# Patient Record
Sex: Female | Born: 1970
Health system: Southern US, Community
[De-identification: ages and names within clinical notes are randomized; demographics above are authoritative.]

## PROBLEM LIST (undated history)

## (undated) DIAGNOSIS — M1990 Unspecified osteoarthritis, unspecified site: Secondary | ICD-10-CM

## (undated) DIAGNOSIS — E785 Hyperlipidemia, unspecified: Secondary | ICD-10-CM

## (undated) DIAGNOSIS — R0609 Other forms of dyspnea: Secondary | ICD-10-CM

## (undated) DIAGNOSIS — K120 Recurrent oral aphthae: Secondary | ICD-10-CM

## (undated) DIAGNOSIS — K219 Gastro-esophageal reflux disease without esophagitis: Secondary | ICD-10-CM

## (undated) DIAGNOSIS — E079 Disorder of thyroid, unspecified: Secondary | ICD-10-CM

## (undated) DIAGNOSIS — T7840XA Allergy, unspecified, initial encounter: Secondary | ICD-10-CM

## (undated) DIAGNOSIS — F319 Bipolar disorder, unspecified: Secondary | ICD-10-CM

## (undated) DIAGNOSIS — F3181 Bipolar II disorder: Secondary | ICD-10-CM

## (undated) DIAGNOSIS — F419 Anxiety disorder, unspecified: Secondary | ICD-10-CM

## (undated) DIAGNOSIS — F32A Depression, unspecified: Secondary | ICD-10-CM

## (undated) DIAGNOSIS — R079 Chest pain, unspecified: Secondary | ICD-10-CM

## (undated) DIAGNOSIS — J45909 Unspecified asthma, uncomplicated: Secondary | ICD-10-CM

## (undated) HISTORY — DX: Bipolar disorder, unspecified: F31.9

## (undated) HISTORY — DX: Hyperlipidemia, unspecified: E78.5

## (undated) HISTORY — PX: RECONSTRUCTION OF EYELID: SHX6576

## (undated) HISTORY — DX: Anxiety disorder, unspecified: F41.9

## (undated) HISTORY — DX: Depression, unspecified: F32.A

## (undated) HISTORY — DX: Unspecified osteoarthritis, unspecified site: M19.90

## (undated) HISTORY — DX: Recurrent oral aphthae: K12.0

## (undated) HISTORY — DX: Allergy, unspecified, initial encounter: T78.40XA

## (undated) HISTORY — DX: Chest pain, unspecified: R07.9

## (undated) HISTORY — DX: Other forms of dyspnea: R06.09

## (undated) HISTORY — PX: BUNIONECTOMY: SHX129

## (undated) HISTORY — PX: UPPER GASTROINTESTINAL ENDOSCOPY: SHX188

## (undated) HISTORY — DX: Gastro-esophageal reflux disease without esophagitis: K21.9

---

## 1998-12-28 DIAGNOSIS — E7800 Pure hypercholesterolemia, unspecified: Secondary | ICD-10-CM

## 1998-12-28 HISTORY — DX: Pure hypercholesterolemia, unspecified: E78.00

## 2007-12-29 HISTORY — PX: MANDIBLE SURGERY: SHX707

## 2011-12-10 DIAGNOSIS — N94818 Other vulvodynia: Secondary | ICD-10-CM | POA: Insufficient documentation

## 2012-11-25 ENCOUNTER — Ambulatory Visit: Payer: Self-pay

## 2013-03-01 ENCOUNTER — Ambulatory Visit: Payer: Self-pay | Admitting: Family Medicine

## 2013-03-01 IMAGING — CR DG CHEST 2V
1 series · 2 of 2 positions shown · non-contrast
Comparison: none

REASON FOR EXAM: cough, persistent
COMMENTS:

PROCEDURE:     DXR - DXR CHEST PA (OR AP) AND LATERAL  - [DATE] [DATE]
RESULT:     Two-view chest dated [DATE].

[Series 1: pa · 0.17mm/px · 2 of 2 slices shown]
[im 1/2]
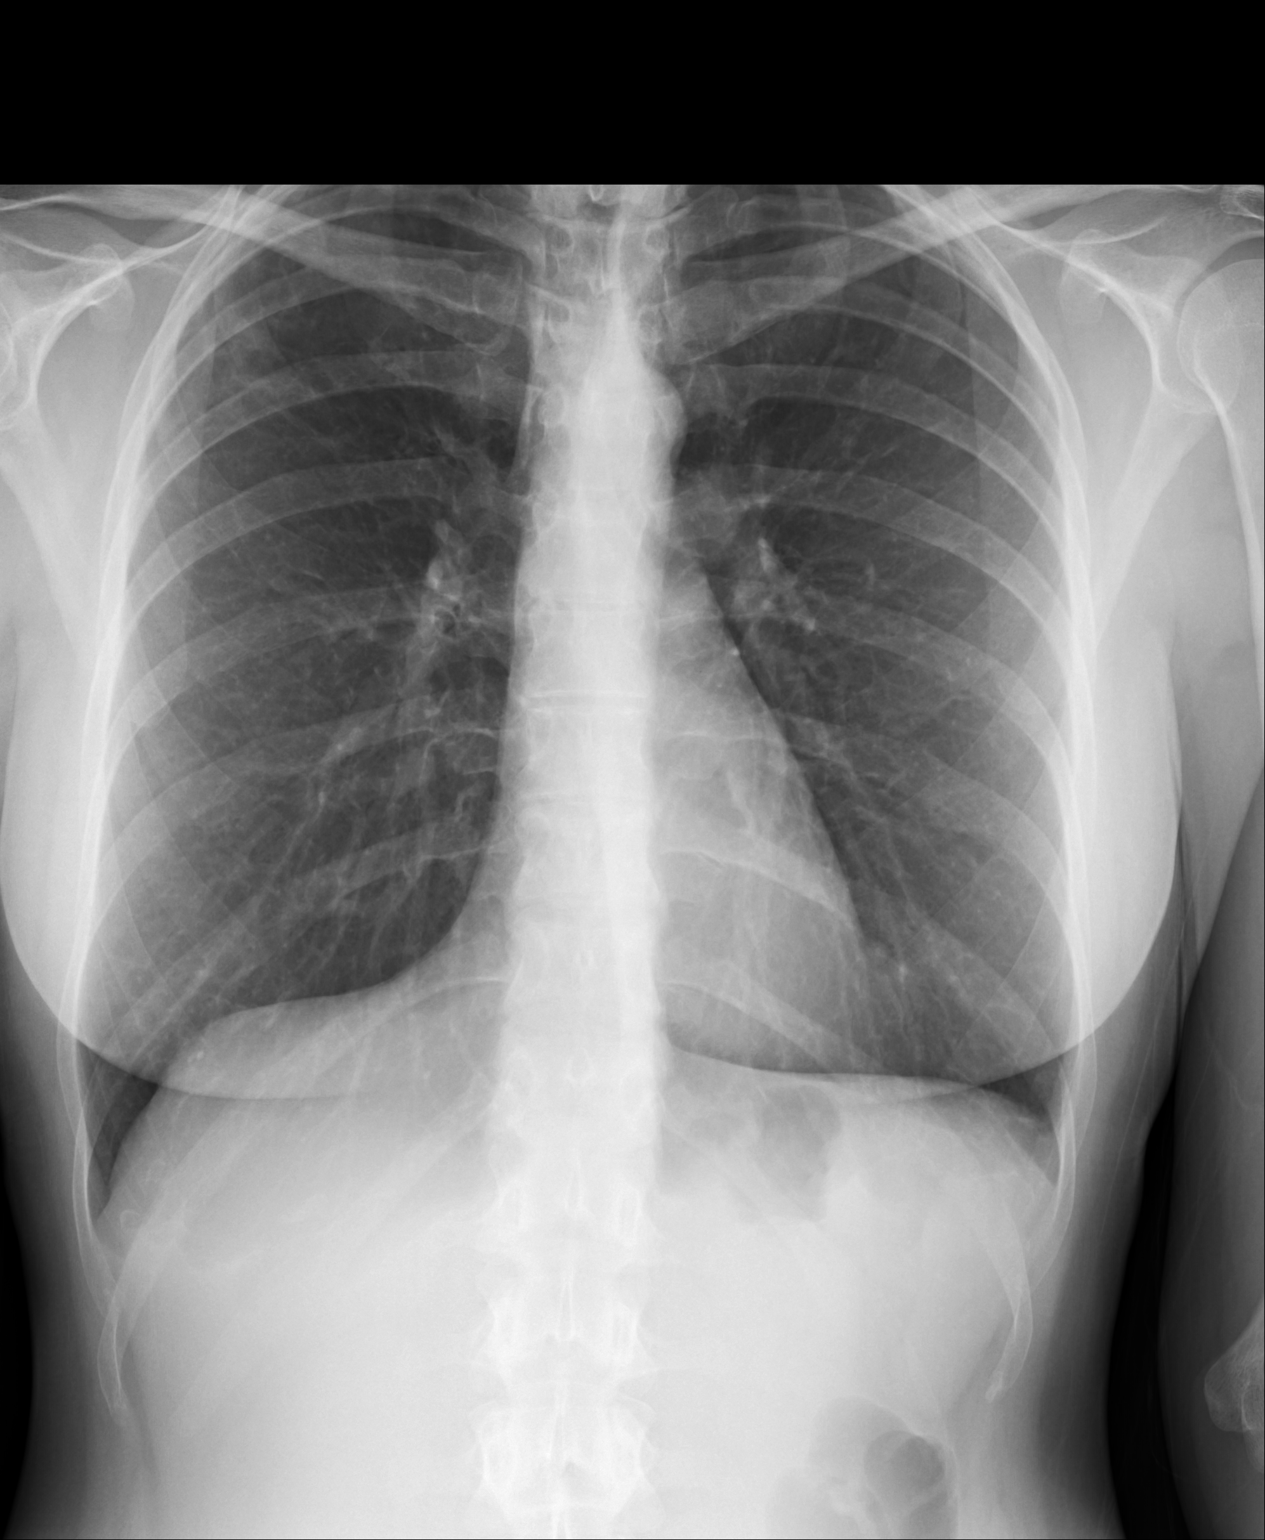
[im 2/2]
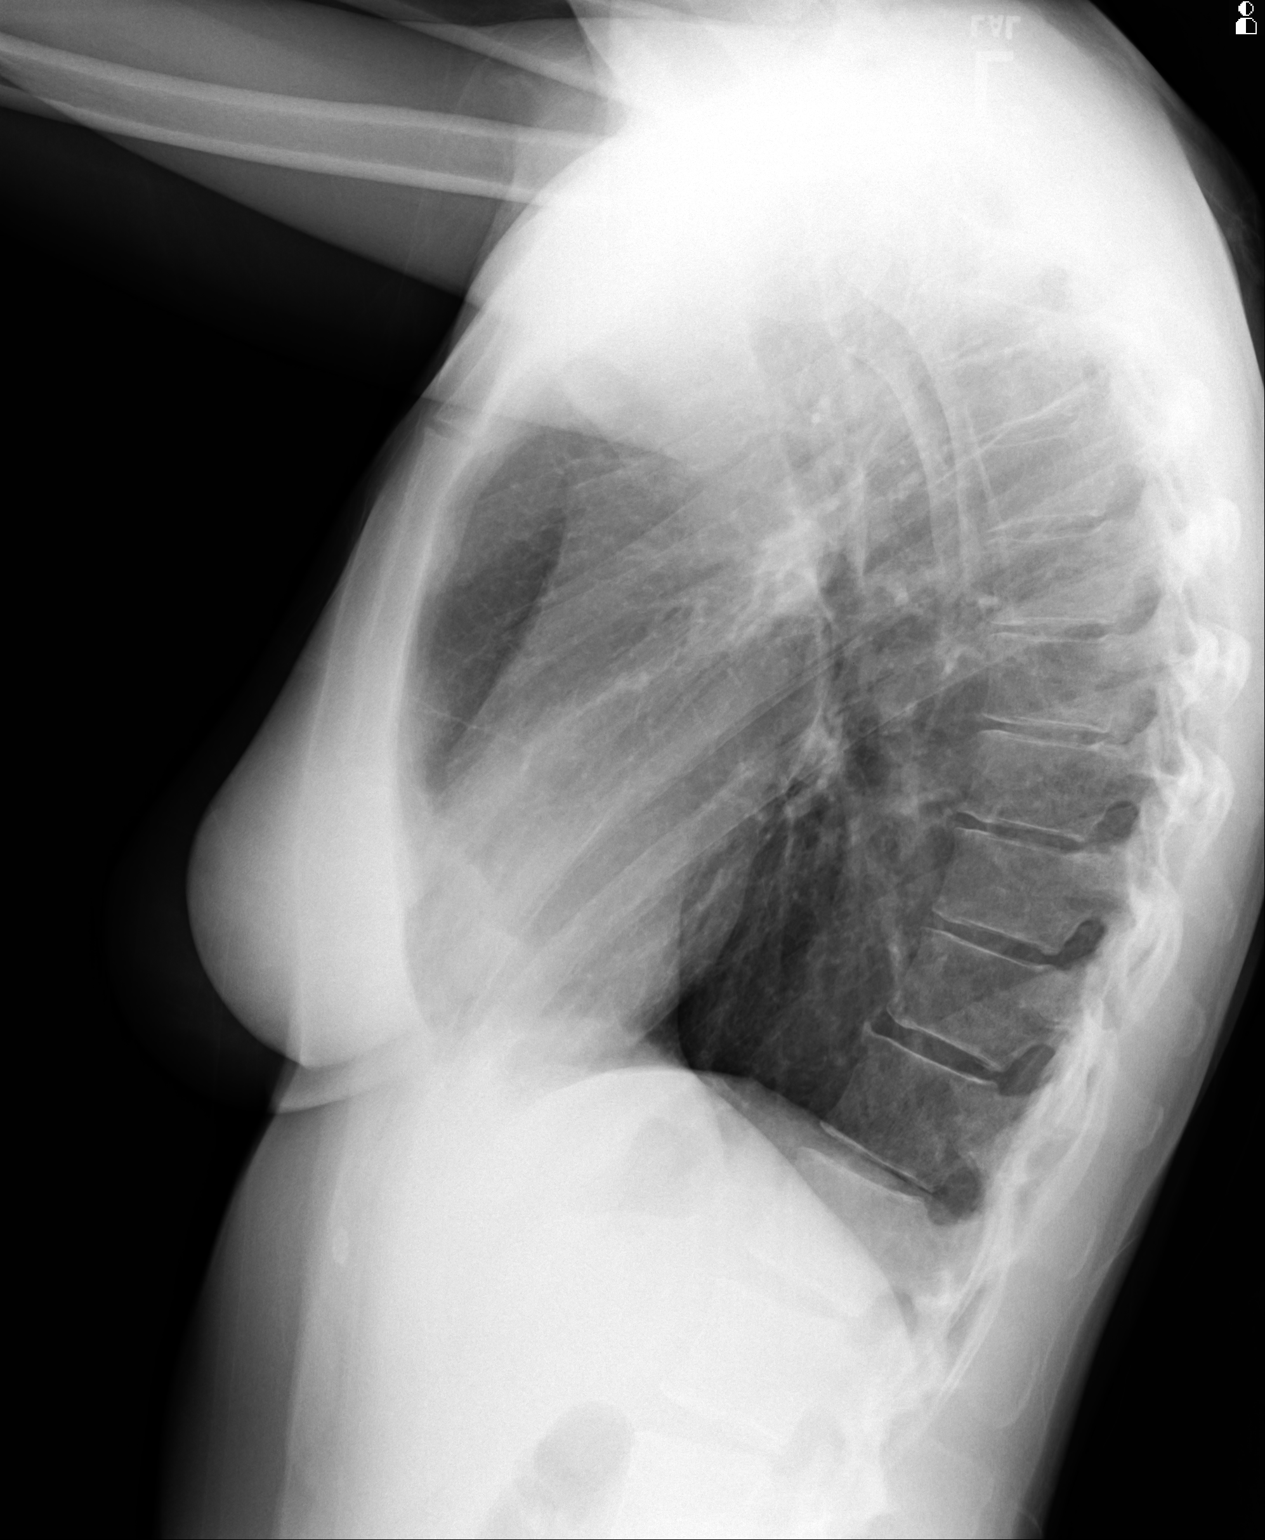

[2 of 2 positions shown; findings below may reference images not displayed]

FINDINGS: A mild area of ill-defined density projects within the right lung
apex. Different considerations are a mild infiltrate versus mild
atelectasis. No further focal recent consolidation of focal infiltrates
appreciated. Cardiac silhouette and visualized bony skeleton are
unremarkable.
IMPRESSION: Findings which are represent a mild infiltrate versus mild
atelectasis right upper lobe surveillance evaluation recommended status post
appropriate therapeutic regiment.

## 2013-03-16 ENCOUNTER — Ambulatory Visit: Payer: Self-pay | Admitting: Obstetrics and Gynecology

## 2013-03-16 IMAGING — CR DG CHEST 2V
1 series · 2 of 2 positions shown · non-contrast
Comparison: none

REASON FOR EXAM: for follow /up peristent
COMMENTS:

[Series 1: pa · 0.17mm/px · 2 of 2 slices shown]
[im 1/2]
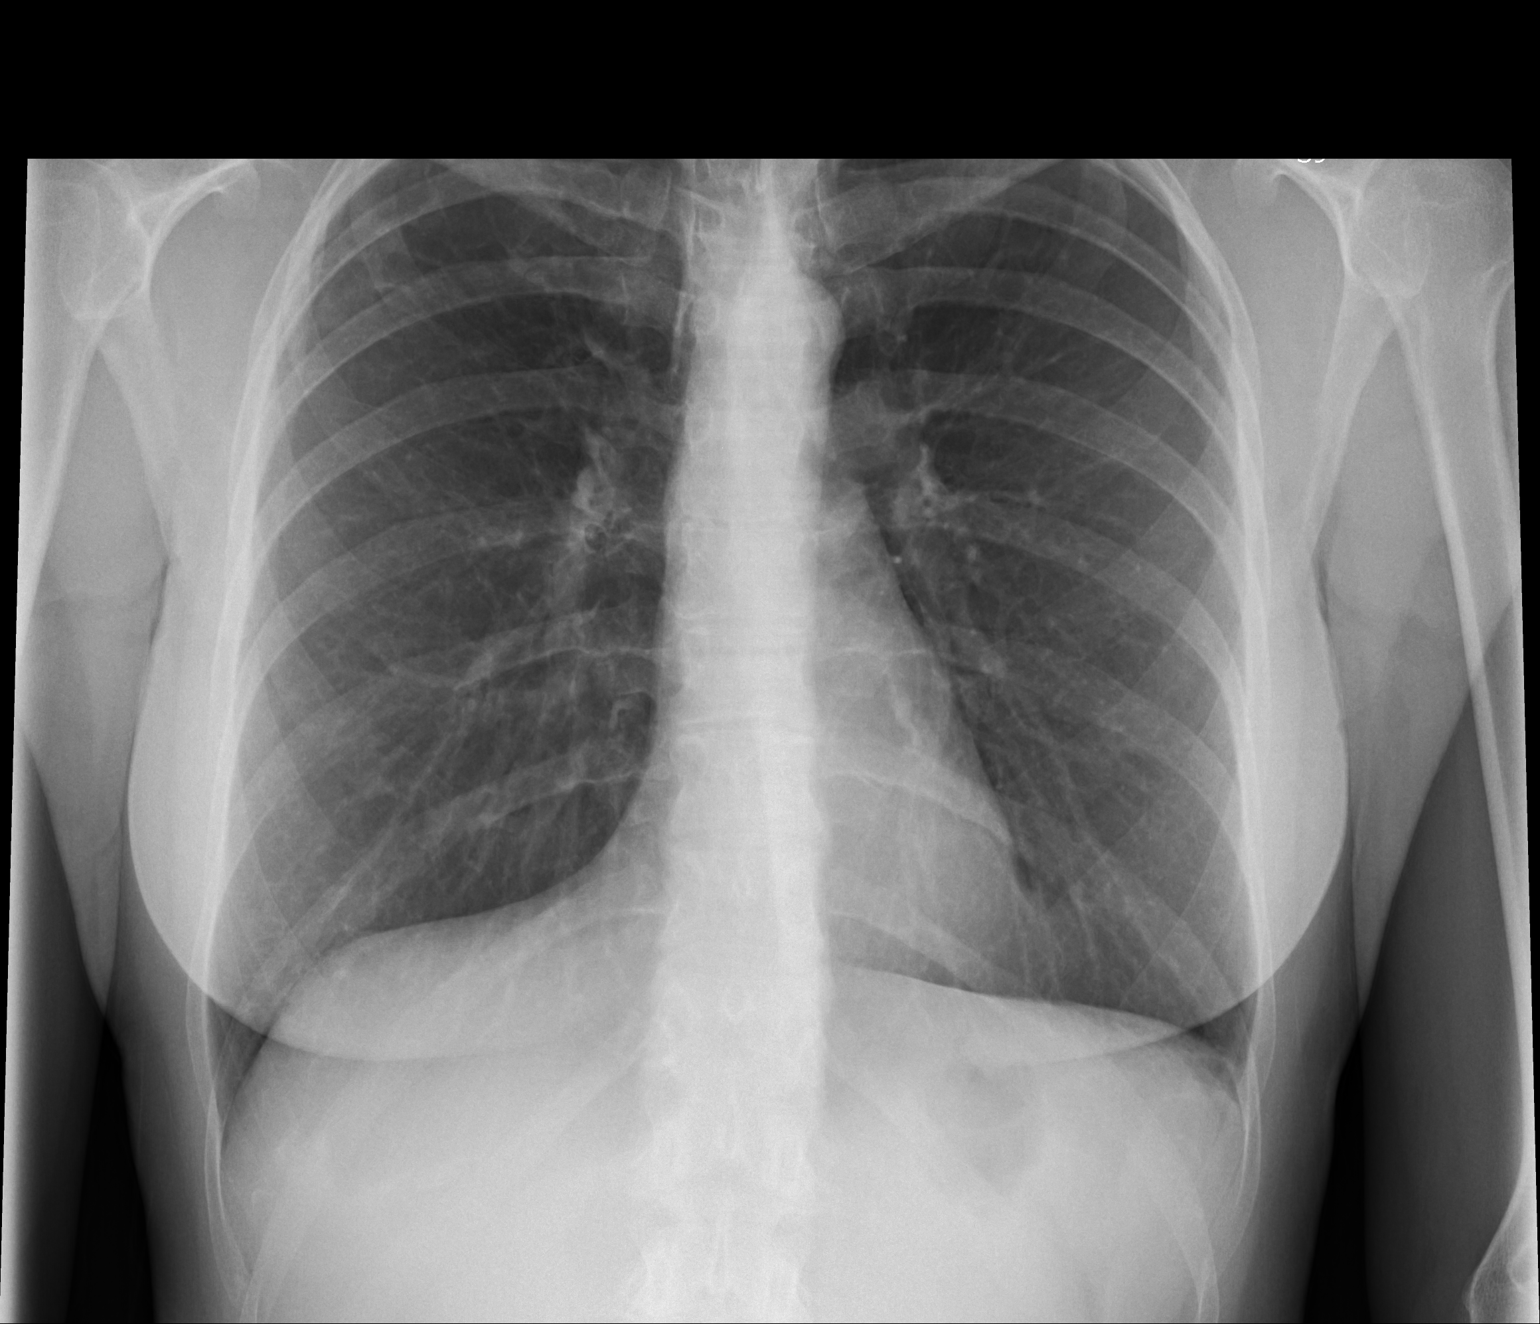
[im 2/2]
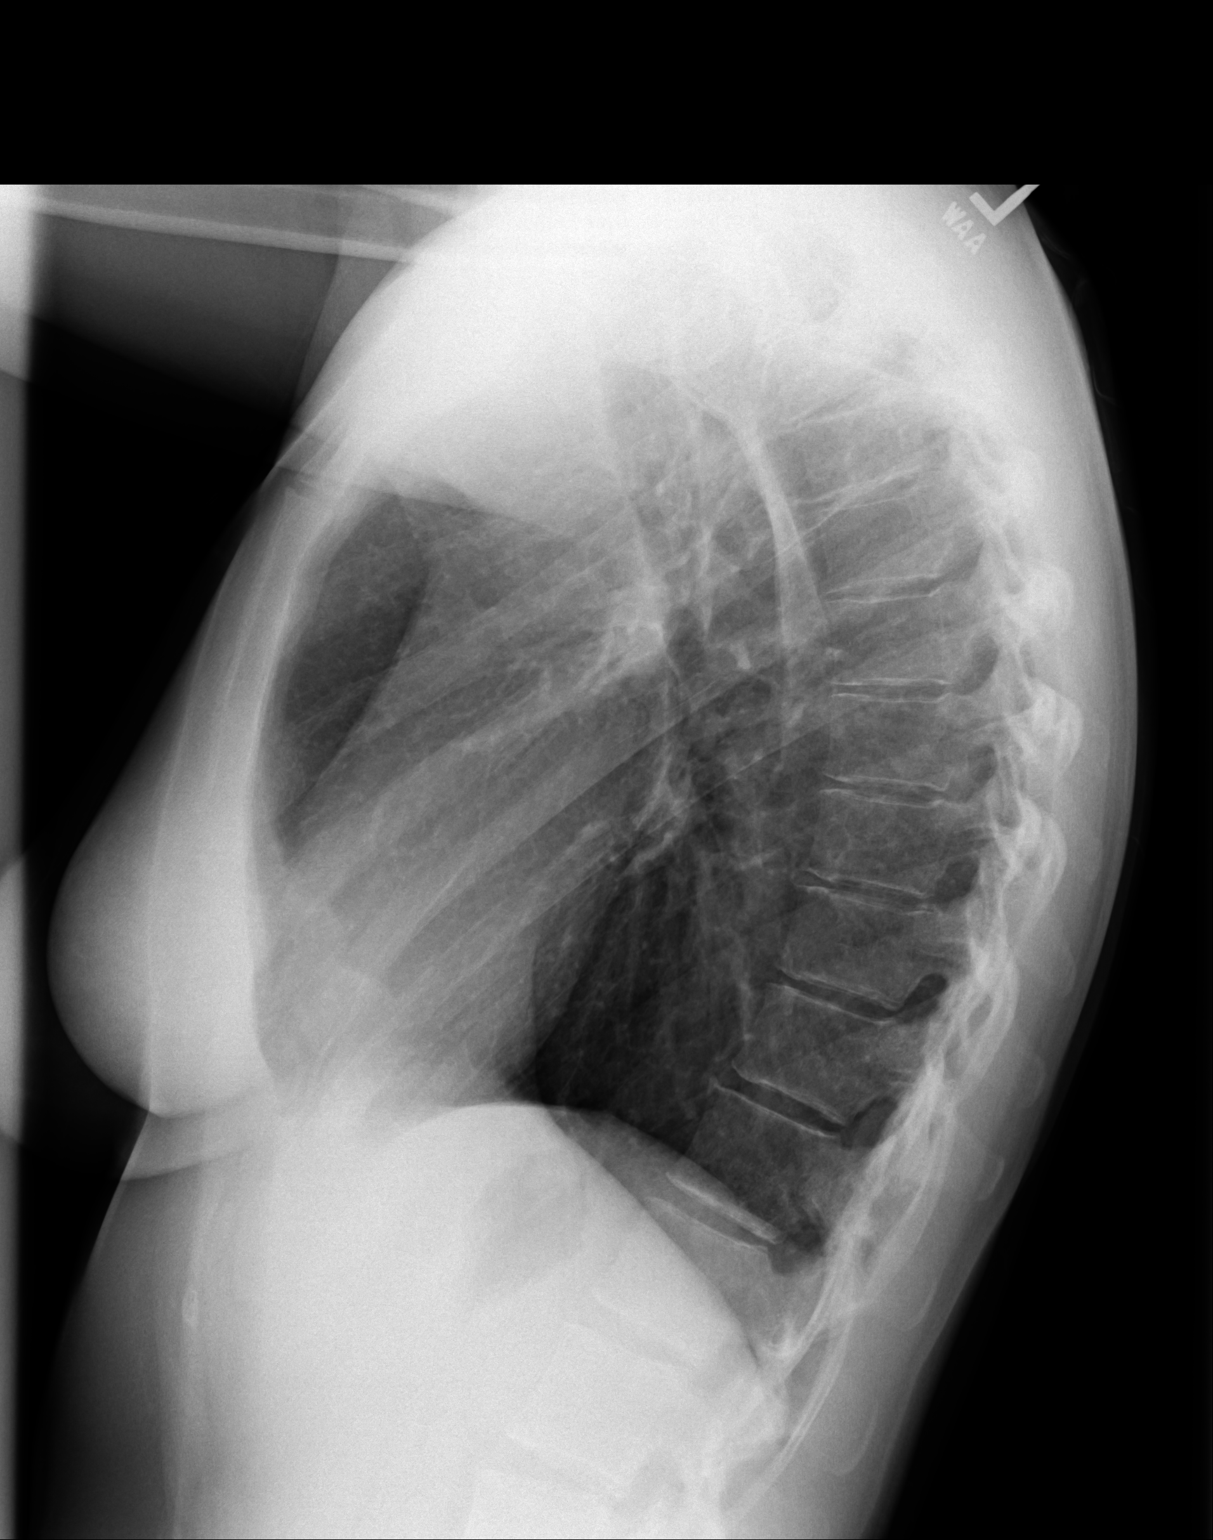

[2 of 2 positions shown; findings below may reference images not displayed]

PROCEDURE:     DXR - DXR CHEST PA (OR AP) AND LATERAL  - [DATE]  [DATE]

RESULT:     Comparison made to prior study of [DATE]. Persistent
ill-defined density right upper lobe noted. This could represent pneumonia
however an underlying tumor or other pathology cannot be excluded. Chest CT
should be considered. Heart size is normal.
IMPRESSION: Persistent ill-defined density in right upper lobe. Chest
CT suggested for further evaluation.

## 2015-07-15 ENCOUNTER — Ambulatory Visit
Admission: EM | Admit: 2015-07-15 | Discharge: 2015-07-15 | Disposition: A | Discharge status: Home / Self Care | Payer: BLUE CROSS/BLUE SHIELD | Attending: Family Medicine | Admitting: Family Medicine

## 2015-07-15 ENCOUNTER — Encounter: Payer: Self-pay | Admitting: Emergency Medicine

## 2015-07-15 DIAGNOSIS — H6123 Impacted cerumen, bilateral: Secondary | ICD-10-CM

## 2015-07-15 DIAGNOSIS — H6503 Acute serous otitis media, bilateral: Secondary | ICD-10-CM | POA: Diagnosis not present

## 2015-07-15 HISTORY — DX: Bipolar II disorder: F31.81

## 2015-07-15 HISTORY — DX: Disorder of thyroid, unspecified: E07.9

## 2015-07-15 MED ORDER — AMOXICILLIN 875 MG PO TABS: 875.0000 mg | ORAL_TABLET | Freq: Two times a day (BID) | ORAL | Status: DC

## 2015-07-15 NOTE — ED Notes (Signed)
Patient c/o pain in both ear and fullness for about a week.  Patient recently got back from a trip to [place].

## 2015-07-15 NOTE — ED Provider Notes (Signed)
CSN: [id number]     Arrival date & time 07/15/15  1051 History   First MD Initiated Contact with Patient 07/15/15 1246     Chief Complaint  Patient presents with  . Ear Fullness   (Consider location/radiation/quality/duration/timing/severity/associated sxs/prior Treatment) Patient is a 44 y.o. female presenting with plugged ear sensation. The history is provided by the patient.  Ear Fullness This is a new problem. Episode onset: 1 week. The problem occurs constantly. The problem has been gradually worsening. Pertinent negatives include no chest pain, no abdominal pain, no headaches and no shortness of breath. Nothing relieves the symptoms.    Past Medical History  Diagnosis Date  . Bipolar 2 disorder   . Thyroid disease    History reviewed. No pertinent past surgical history. History reviewed. No pertinent family history. History  Substance Use Topics  . Smoking status: Never Smoker   . Smokeless tobacco: Never Used  . Alcohol Use: Yes   OB History    No data available     Review of Systems  Respiratory: Negative for shortness of breath.   Cardiovascular: Negative for chest pain.  Gastrointestinal: Negative for abdominal pain.  Neurological: Negative for headaches.    Allergies  Review of patient's allergies indicates no known allergies.  Home Medications   Prior to Admission medications   Medication Sig Start Date End Date Taking? Authorizing Provider  buPROPion (WELLBUTRIN XL) 150 MG 24 hr tablet Take 150 mg by mouth daily.   Yes Historical Provider, MD  clonazePAM (KLONOPIN) 0.5 MG tablet Take 0.5 mg by mouth daily.   Yes Historical Provider, MD  divalproex (DEPAKOTE) 500 MG DR tablet Take 500 mg by mouth 2 (two) times daily.   Yes Historical Provider, MD  escitalopram (LEXAPRO) 20 MG tablet Take 20 mg by mouth daily.   Yes Historical Provider, MD  levothyroxine (SYNTHROID, LEVOTHROID) 100 MCG tablet Take 100 mcg by mouth daily before breakfast.   Yes Historical  Provider, MD  norgestimate-ethinyl estradiol (ORTHO-CYCLEN,SPRINTEC,PREVIFEM) 0.25-35 MG-MCG tablet Take 1 tablet by mouth daily.   Yes Historical Provider, MD  zolpidem (AMBIEN) 5 MG tablet Take 5 mg by mouth at bedtime.   Yes Historical Provider, MD  amoxicillin (AMOXIL) 875 MG tablet Take 1 tablet (875 mg total) by mouth 2 (two) times daily. 07/15/15   Payton Mccallumrlando Tillmon Kisling, MD   BP 137/91 mmHg  Pulse 69  Temp(Src) 98.1 F (36.7 C) (Tympanic)  Resp 16  Ht 5\' 5"  (1.651 m)  Wt 145 lb (65.772 kg)  BMI 24.13 kg/m2  SpO2 100%  LMP 06/17/2015 (Approximate) Physical Exam  Constitutional: She appears well-developed and well-nourished. No distress.  HENT:  Head: Normocephalic.  Right Ear: Tympanic membrane is erythematous. A middle ear effusion is present.  Left Ear: Tympanic membrane is erythematous. A middle ear effusion is present.  Nose: Nose normal.  Mouth/Throat: Uvula is midline, oropharynx is clear and moist and mucous membranes are normal.  TMs visualized after bilateral cerumen disimpaction with ear lavage  Eyes: Conjunctivae are normal.  Neck: Neck supple. No thyromegaly present.  Cardiovascular: Normal rate.   Pulmonary/Chest: Effort normal. No respiratory distress.  Lymphadenopathy:    She has no cervical adenopathy.  Skin: She is not diaphoretic.  Nursing note and vitals reviewed.   ED Course  Procedures (including critical care time) Labs Review Labs Reviewed - No data to display  Imaging Review No results found.   MDM   1. Bilateral acute serous otitis media, recurrence not specified   2.  Cerumen impaction, bilateral    Discharge Medication List as of 07/15/2015  1:27 PM    START taking these medications   Details  amoxicillin (AMOXIL) 875 MG tablet Take 1 tablet (875 mg total) by mouth 2 (two) times daily., Starting 07/15/2015, Until Discontinued, Normal      Plan: 1. diagnosis reviewed with patient; ear lavage by RN with cerumen disimpaction and visualization  of TMs bilaterally 2. rx as per orders; risks, benefits, potential side effects reviewed with patient 3. Recommend supportive treatment with otc antihistamine/decongestant and steroid nasal spray 4. F/u prn if symptoms worsen or don't improve    Payton Mccallum, MD 07/15/15 1331

## 2015-10-14 DIAGNOSIS — R9389 Abnormal findings on diagnostic imaging of other specified body structures: Secondary | ICD-10-CM | POA: Insufficient documentation

## 2015-10-14 DIAGNOSIS — R0600 Dyspnea, unspecified: Secondary | ICD-10-CM | POA: Insufficient documentation

## 2015-10-14 DIAGNOSIS — J849 Interstitial pulmonary disease, unspecified: Secondary | ICD-10-CM | POA: Insufficient documentation

## 2015-10-14 DIAGNOSIS — E031 Congenital hypothyroidism without goiter: Secondary | ICD-10-CM | POA: Insufficient documentation

## 2015-10-14 DIAGNOSIS — R0609 Other forms of dyspnea: Secondary | ICD-10-CM | POA: Insufficient documentation

## 2016-05-12 ENCOUNTER — Ambulatory Visit
Admission: RE | Admit: 2016-05-12 | Discharge: 2016-05-12 | Disposition: A | Discharge status: Home / Self Care | Payer: BLUE CROSS/BLUE SHIELD | Source: Ambulatory Visit | Attending: Orthopedic Surgery | Admitting: Orthopedic Surgery

## 2016-05-12 ENCOUNTER — Other Ambulatory Visit: Payer: Self-pay | Admitting: Orthopedic Surgery

## 2016-05-12 DIAGNOSIS — M179 Osteoarthritis of knee, unspecified: Secondary | ICD-10-CM | POA: Insufficient documentation

## 2016-05-12 DIAGNOSIS — M25562 Pain in left knee: Secondary | ICD-10-CM

## 2016-05-12 DIAGNOSIS — R52 Pain, unspecified: Secondary | ICD-10-CM

## 2016-05-12 DIAGNOSIS — M25561 Pain in right knee: Secondary | ICD-10-CM | POA: Insufficient documentation

## 2016-05-12 IMAGING — CR DG KNEE COMPLETE 4+V*L*
1 series · 2 of 2 positions shown · non-contrast
Comparison: None.

CLINICAL DATA: Pain for several weeks

EXAM:
LEFT KNEE - 3 VIEW

[Series 1: dg knee complete 4 views left · 0.14mm/px · 2 of 2 slices shown]
[im 1/2]
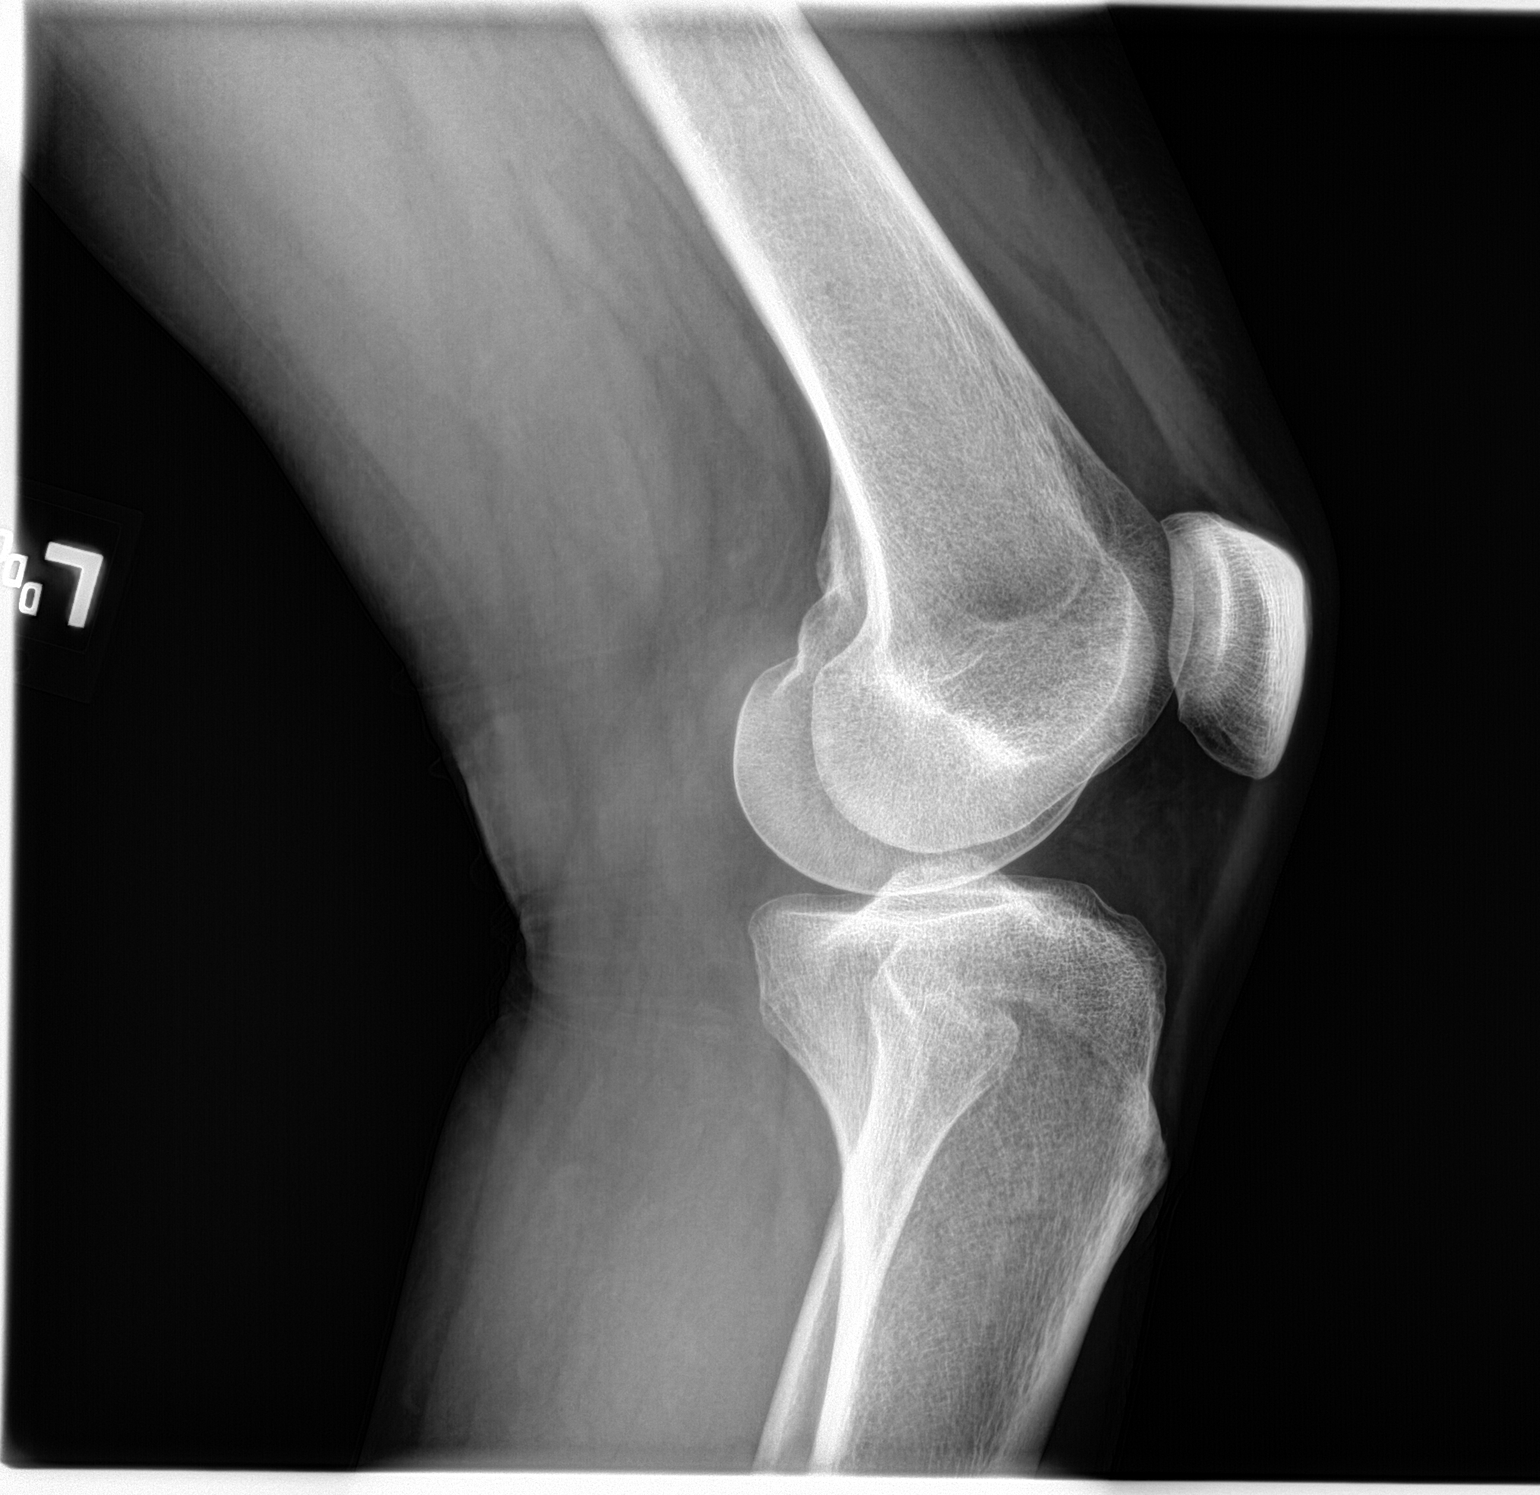
[im 2/2]
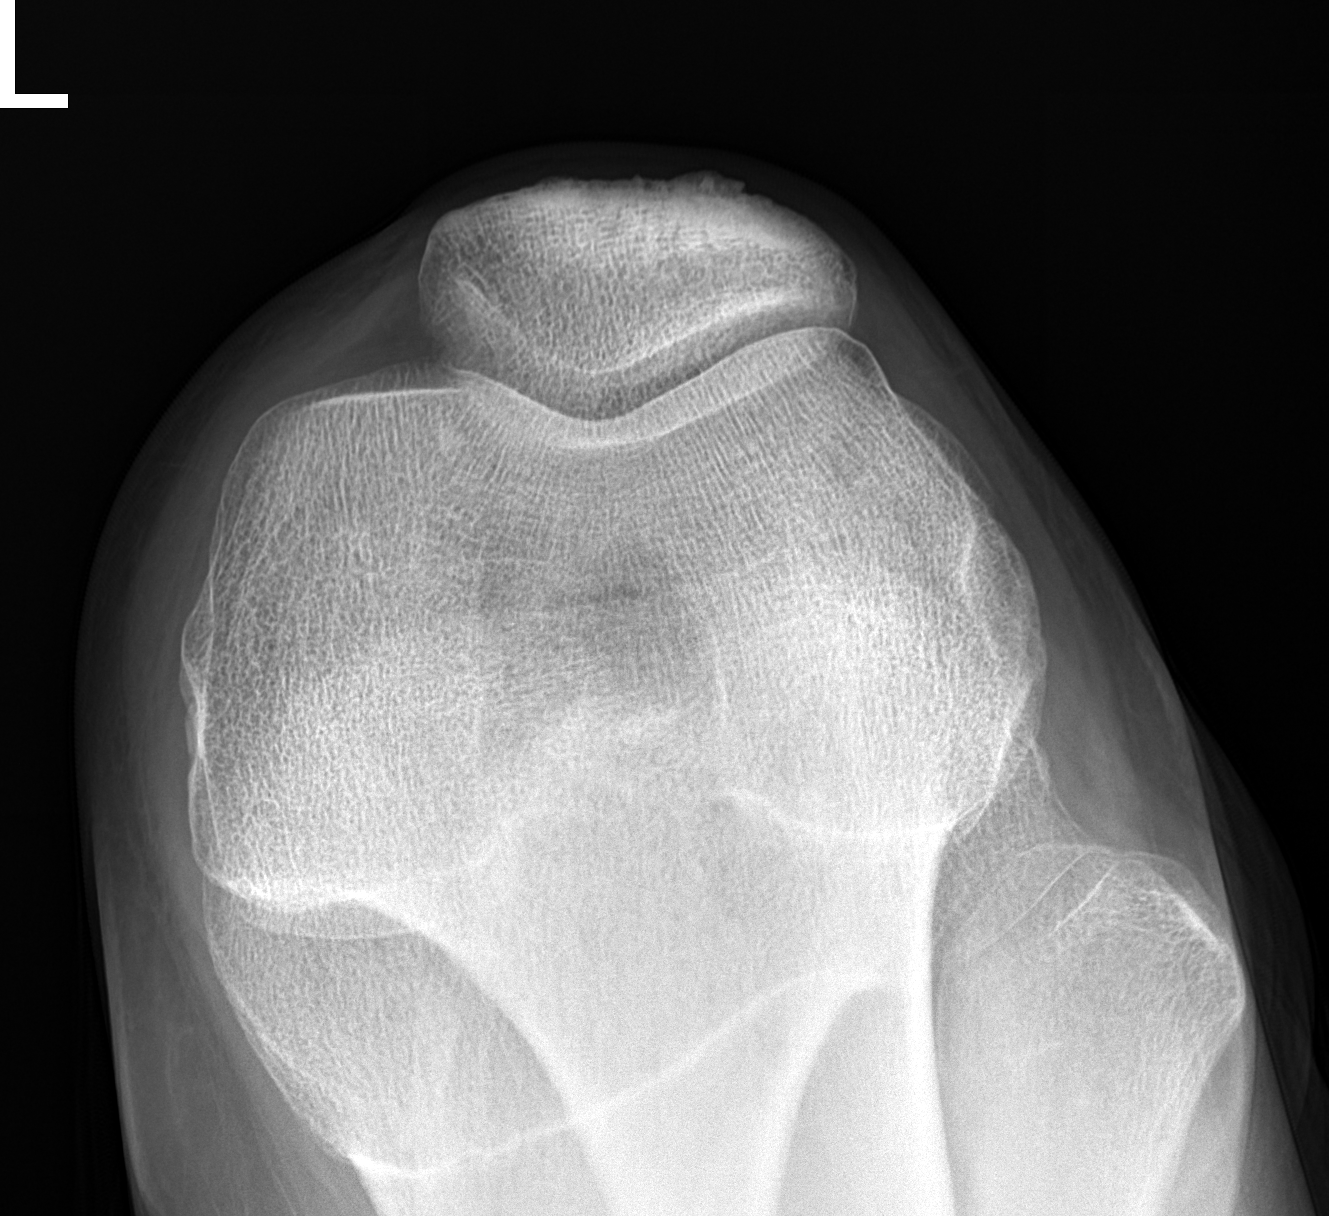

[2 of 2 positions shown; findings below may reference images not displayed]

FINDINGS: Standing frontal, lateral, and sunrise patellar images were
obtained. There is no demonstrable fracture or dislocation. No joint
effusion. There is moderate narrowing medially. Other joint spaces
appear normal. No erosive change.
IMPRESSION: Narrowing medially. No fracture or dislocation. No appreciable joint
effusion.

## 2016-05-12 IMAGING — CR DG KNEE STANDING AP BILAT
1 series · 1 of 1 positions shown · non-contrast
Comparison: None.

CLINICAL DATA: Pain bilaterally

EXAM:
BILATERAL KNEES STANDING - 1 VIEW

[dg knee bilateral standing ap]
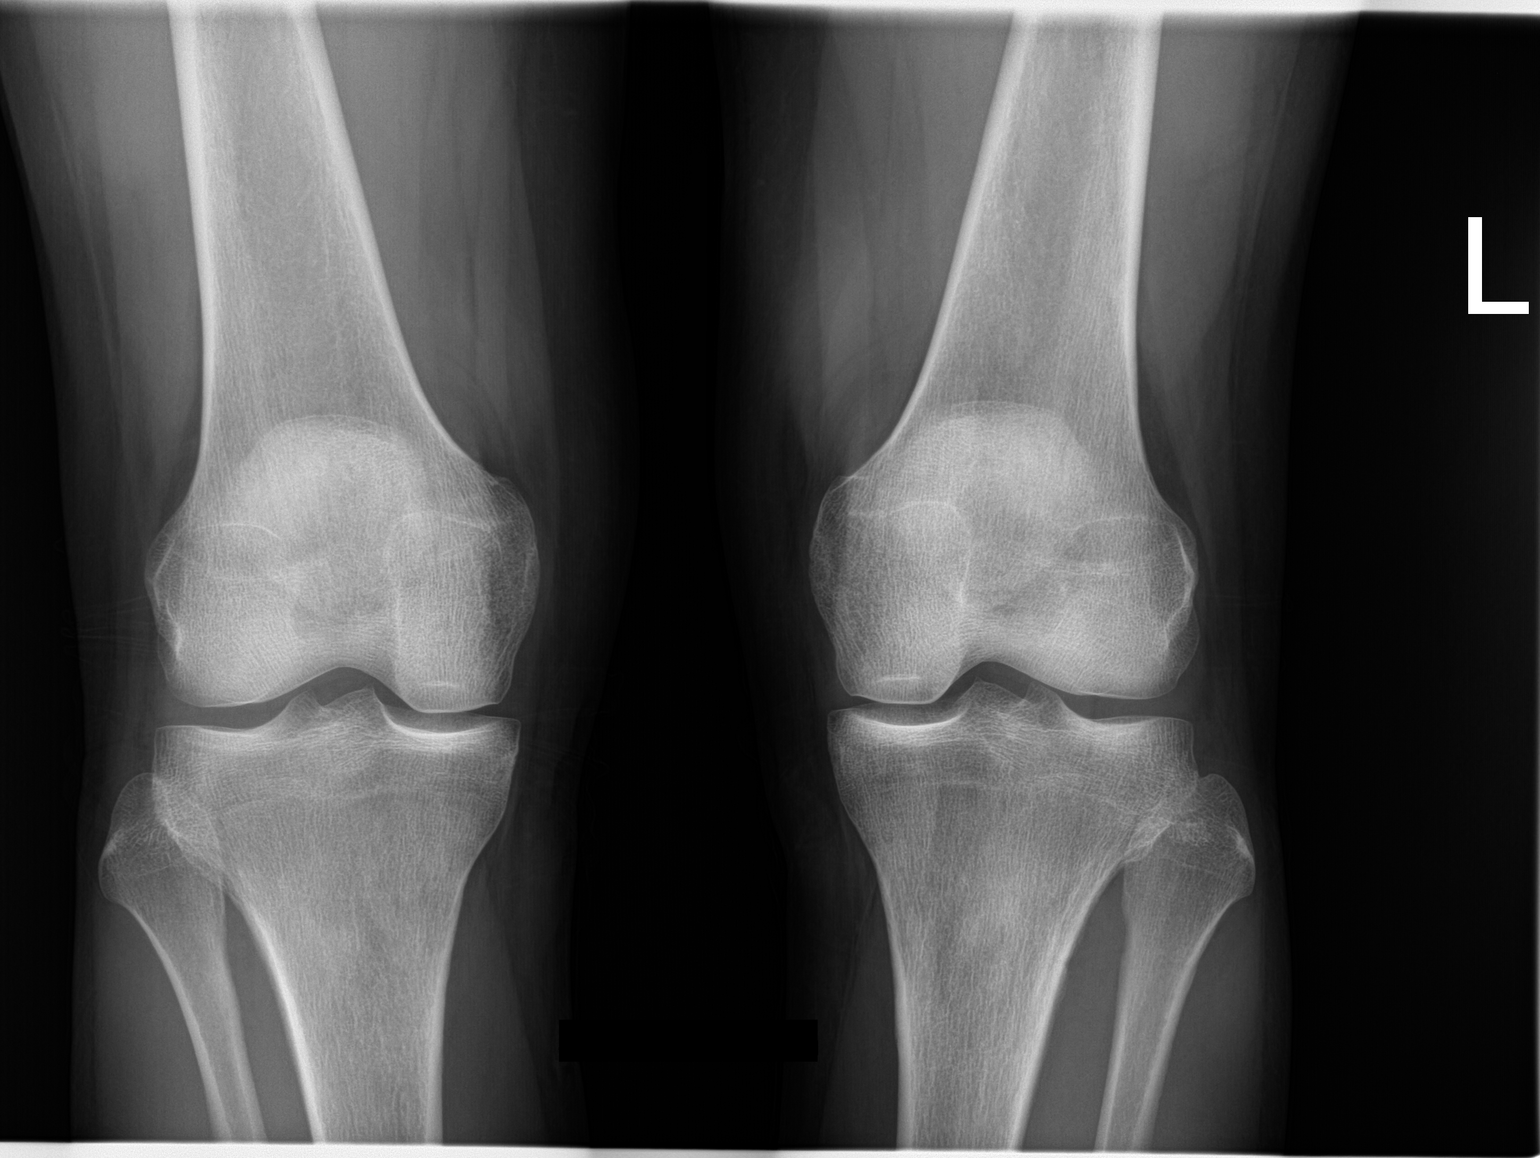

[1 of 1 positions shown; findings below may reference images not displayed]

FINDINGS: Standing tunnel views show symmetric narrowing medially. No fracture
or dislocation. No erosive change.
IMPRESSION: Symmetric narrowing medially.  No fracture or dislocation.

## 2016-06-14 ENCOUNTER — Ambulatory Visit
Admission: EM | Admit: 2016-06-14 | Discharge: 2016-06-14 | Disposition: A | Discharge status: Home / Self Care | Payer: BLUE CROSS/BLUE SHIELD | Attending: Family Medicine | Admitting: Family Medicine

## 2016-06-14 ENCOUNTER — Encounter: Payer: Self-pay | Admitting: Emergency Medicine

## 2016-06-14 DIAGNOSIS — W57XXXA Bitten or stung by nonvenomous insect and other nonvenomous arthropods, initial encounter: Secondary | ICD-10-CM

## 2016-06-14 DIAGNOSIS — T148 Other injury of unspecified body region: Secondary | ICD-10-CM | POA: Diagnosis not present

## 2016-06-14 DIAGNOSIS — L089 Local infection of the skin and subcutaneous tissue, unspecified: Secondary | ICD-10-CM

## 2016-06-14 MED ORDER — DOXYCYCLINE HYCLATE 100 MG PO CAPS: 100.0000 mg | ORAL_CAPSULE | Freq: Two times a day (BID) | ORAL | Status: DC

## 2016-06-14 NOTE — ED Provider Notes (Signed)
CSN: [id number]     Arrival date & time 06/14/16  1452 History   First MD Initiated Contact with Patient 06/14/16 1519     Chief Complaint  Patient presents with  . Tick Removal   (Consider location/radiation/quality/duration/timing/severity/associated sxs/prior Treatment) HPI: Patient states that a vesicular lesion on her first big toe started to drain yesterday. Patient states that 3 weeks ago she did remove a tick from the area. She does not know if this could be related to the tick bite. There was a scab on the area after she had removed the tick. She has noticed over the past day that there is redness and warmth of the area. She denies any nausea, vomiting, fever, headache, joint pain. She denies any history of MRSA in the past. She denies any other tick bites.  Past Medical History  Diagnosis Date  . Bipolar 2 disorder (HCC)   . Thyroid disease    Past Surgical History  Procedure Laterality Date  . Mandible surgery  2009  . Reconstruction of eyelid  1989 and 1990  . Bunionectomy     No family history on file. Social History  Substance Use Topics  . Smoking status: Never Smoker   . Smokeless tobacco: Never Used  . Alcohol Use: Yes   OB History    No data available     Review of Systems: Negative except mentioned above.   Allergies  Review of patient's allergies indicates no known allergies.  Home Medications   Prior to Admission medications   Medication Sig Start Date End Date Taking? Authorizing Provider  clonazePAM (KLONOPIN) 0.5 MG tablet Take 0.5 mg by mouth daily.   Yes Historical Provider, MD  diclofenac (VOLTAREN) 75 MG EC tablet Take 75 mg by mouth 2 (two) times daily.   Yes Historical Provider, MD  divalproex (DEPAKOTE) 500 MG DR tablet Take 500 mg by mouth 2 (two) times daily.   Yes Historical Provider, MD  escitalopram (LEXAPRO) 20 MG tablet Take 20 mg by mouth daily.   Yes Historical Provider, MD  levothyroxine (SYNTHROID, LEVOTHROID) 100 MCG tablet Take  100 mcg by mouth daily before breakfast.   Yes Historical Provider, MD  montelukast (SINGULAIR) 10 MG tablet Take 10 mg by mouth at bedtime.   Yes Historical Provider, MD  norgestimate-ethinyl estradiol (ORTHO-CYCLEN,SPRINTEC,PREVIFEM) 0.25-35 MG-MCG tablet Take 1 tablet by mouth daily.   Yes Historical Provider, MD  amoxicillin (AMOXIL) 875 MG tablet Take 1 tablet (875 mg total) by mouth 2 (two) times daily. 07/15/15   Payton Mccallum, MD  buPROPion (WELLBUTRIN XL) 150 MG 24 hr tablet Take 150 mg by mouth daily.    Historical Provider, MD  doxycycline (VIBRAMYCIN) 100 MG capsule Take 1 capsule (100 mg total) by mouth 2 (two) times daily. 06/14/16   Jolene Provost, MD  zolpidem (AMBIEN) 5 MG tablet Take 5 mg by mouth at bedtime.    Historical Provider, MD   Meds Ordered and Administered this Visit  Medications - No data to display  BP 117/85 mmHg  Pulse 74  Temp(Src) 98.2 F (36.8 C) (Tympanic)  Resp 16  Ht  (1.651 m)  Wt 150 lb (68.04 kg)  BMI 24.96 kg/m2  SpO2 98%  LMP 05/19/2016 No data found.   Physical Exam:  GENERAL: NAD RESP: CTA B CARD: RRR  SKIN: L First Toe- there is a small draining vesicular lesion on the dorsal aspect of the first toe, the drainage appears to be clear, there is erythema around the  site extending into the first metatarsal area, tenderness to palpation and slight warmth of the area NEURO: CN II-XII grossly intact   ED Course  Procedures (including critical care time)  Labs Review Labs Reviewed - No data to display  Imaging Review No results found.      MDM   1. Tick bite   2. Skin infection   Will treat patient with Doxycycline, keep area clean and dry, if symptoms do persist or worsen I have asked that she seek medical attention. Patient addresses understanding.    Jolene ProvostKirtida Nasiyah Laverdiere, MD 06/14/16 [phone]

## 2016-06-14 NOTE — ED Notes (Signed)
Pulled tick off left big toe 3 weeks ago. Now there is a bump, red, swollen, itching and painful

## 2016-08-12 ENCOUNTER — Encounter: Payer: Self-pay | Admitting: Emergency Medicine

## 2016-08-12 ENCOUNTER — Ambulatory Visit
Admission: EM | Admit: 2016-08-12 | Discharge: 2016-08-12 | Disposition: A | Discharge status: Home / Self Care | Payer: BLUE CROSS/BLUE SHIELD | Attending: Family Medicine | Admitting: Family Medicine

## 2016-08-12 DIAGNOSIS — J029 Acute pharyngitis, unspecified: Secondary | ICD-10-CM

## 2016-08-12 DIAGNOSIS — J019 Acute sinusitis, unspecified: Secondary | ICD-10-CM | POA: Diagnosis not present

## 2016-08-12 DIAGNOSIS — H109 Unspecified conjunctivitis: Secondary | ICD-10-CM

## 2016-08-12 HISTORY — DX: Unspecified asthma, uncomplicated: J45.909

## 2016-08-12 LAB — RAPID STREP SCREEN (MED CTR MEBANE ONLY): Streptococcus, Group A Screen (Direct): NEGATIVE

## 2016-08-12 MED ORDER — CEFUROXIME AXETIL 500 MG PO TABS: 500.0000 mg | ORAL_TABLET | Freq: Two times a day (BID) | ORAL | 0 refills | Status: DC

## 2016-08-12 MED ORDER — CETIRIZINE-PSEUDOEPHEDRINE ER 5-120 MG PO TB12: 1.0000 | ORAL_TABLET | Freq: Two times a day (BID) | ORAL | 0 refills | Status: AC | PRN

## 2016-08-12 MED ORDER — FLUTICASONE PROPIONATE 50 MCG/ACT NA SUSP: 2.0000 | Freq: Every day | NASAL | 0 refills | Status: AC

## 2016-08-12 NOTE — ED Triage Notes (Signed)
Patient c/o redness and yellow drainage from her left eye that started today.

## 2016-08-12 NOTE — ED Provider Notes (Signed)
[facility]    CSN: [id number] Arrival date & time: 08/12/16  1440  First Provider Contact:  First MD Initiated Contact with Patient 08/12/16 1508        History   Chief Complaint Chief Complaint  Patient presents with  . Eye Problem    left    HPI Stephanie Mcguire is a 45 y.o. female.   Patient's here because of multiple symptoms her left eye became red irritated and inflamed since she came in to be seen. Left eyelid has been drooping but she states that she has a condition where her left eyelid normally we'll troop but usually is not too bad until she is tired or fatigued. She's had 2 previous eyelid surgery said not worked to reverse the problem with the droopy left eye. When she gets fatigue or ill states the left eye droops more. She reports having sore throat for about 3 or 4 days in a sinus infection for over a week. She uses Zyrtec but hasn't helped her any and she recently has had some remodeling the house with a lot of new construction done due to mold being found in her house.   Past history she denies any drug allergies she does not smoke. She has a history of bipolar disease thyroid disease and asthma. She's had mandible surgery surgeries on the left eyelid twice and bunionectomy. No significant family medical history pertinent to today's visit.    The history is provided by the patient. No language interpreter was used.  Eye Problem  Location:  Left eye Quality:  Burning and stinging Severity:  Moderate Onset quality:  Sudden Timing:  Constant Chronicity:  New Context: not burn, not chemical exposure, not contact lens problem, not direct trauma, not foreign body, not using machinery, not scratch and not smoke exposure   Relieved by:  Nothing Worsened by:  Nothing Ineffective treatments:  None tried Associated symptoms: discharge, inflammation and photophobia   Associated symptoms: no headaches   Risk factors: recent URI   Sore Throat  This is a  new problem. The current episode started more than 2 days ago. The problem occurs constantly. The problem has been gradually worsening. Pertinent negatives include no chest pain, no abdominal pain, no headaches and no shortness of breath. Associated symptoms comments: Patient states she's had a sinus infection for over a week. Nothing aggravates the symptoms. Nothing relieves the symptoms. The treatment provided no relief.    Past Medical History:  Diagnosis Date  . Asthma   . Bipolar 2 disorder (HCC)   . Thyroid disease     There are no active problems to display for this patient.   Past Surgical History:  Procedure Laterality Date  . BUNIONECTOMY    . MANDIBLE SURGERY  2009  . RECONSTRUCTION OF EYELID  1989 and 1990    OB History    No data available       Home Medications    Prior to Admission medications   Medication Sig Start Date End Date Taking? Authorizing Provider  buPROPion (WELLBUTRIN XL) 150 MG 24 hr tablet Take 150 mg by mouth daily.    Historical Provider, MD  cefUROXime (CEFTIN) 500 MG tablet Take 1 tablet (500 mg total) by mouth 2 (two) times daily. 08/12/16   Hassan RowanEugene Jarah Pember, MD  cetirizine-pseudoephedrine (ZYRTEC-D) 5-120 MG tablet Take 1 tablet by mouth 2 (two) times daily as needed for allergies. 08/12/16   Hassan RowanEugene Koralyn Prestage, MD  clonazePAM (KLONOPIN) 0.5 MG tablet Take  0.5 mg by mouth daily.    Historical Provider, MD  diclofenac (VOLTAREN) 75 MG EC tablet Take 75 mg by mouth 2 (two) times daily.    Historical Provider, MD  divalproex (DEPAKOTE) 500 MG DR tablet Take 1,000 mg by mouth daily.     Historical Provider, MD  escitalopram (LEXAPRO) 20 MG tablet Take 20 mg by mouth daily.    Historical Provider, MD  fluticasone (FLONASE) 50 MCG/ACT nasal spray Place 2 sprays into both nostrils daily. 08/12/16   Hassan RowanEugene Charvis Lightner, MD  levothyroxine (SYNTHROID, LEVOTHROID) 100 MCG tablet Take 100 mcg by mouth daily before breakfast.    Historical Provider, MD  montelukast (SINGULAIR)  10 MG tablet Take 10 mg by mouth at bedtime.    Historical Provider, MD  norgestimate-ethinyl estradiol (ORTHO-CYCLEN,SPRINTEC,PREVIFEM) 0.25-35 MG-MCG tablet Take 1 tablet by mouth daily.    Historical Provider, MD  zolpidem (AMBIEN) 5 MG tablet Take 5 mg by mouth at bedtime.    Historical Provider, MD    Family History History reviewed. No pertinent family history.  Social History Social History  Substance Use Topics  . Smoking status: Never Smoker  . Smokeless tobacco: Never Used  . Alcohol use Yes     Allergies   Review of patient's allergies indicates no known allergies.   Review of Systems Review of Systems  Eyes: Positive for photophobia and discharge.  Respiratory: Negative for shortness of breath.   Cardiovascular: Negative for chest pain.  Gastrointestinal: Negative for abdominal pain.  Neurological: Negative for headaches.  All other systems reviewed and are negative.    Physical Exam Triage Vital Signs ED Triage Vitals  Enc Vitals Group     BP 08/12/16 1507 130/86     Pulse Rate 08/12/16 1507 90     Resp 08/12/16 1507 16     Temp 08/12/16 1507 97.4 F (36.3 C)     Temp Source 08/12/16 1507 Tympanic     SpO2 08/12/16 1507 100 %     Weight 08/12/16 1507 154 lb (69.9 kg)     Height 08/12/16 1507 5\' 5"  (1.651 m)     Head Circumference --      Peak Flow --      Pain Score 08/12/16 1511 7     Pain Loc --      Pain Edu? --      Excl. in GC? --    No data found.   Updated Vital Signs BP 130/86 (BP Location: Left Arm)   Pulse 90   Temp 97.4 F (36.3 C) (Tympanic)   Resp 16   Ht 5\' 5"  (1.651 m)   Wt 154 lb (69.9 kg)   LMP 08/11/2016 (Exact Date)   SpO2 100%   BMI 25.63 kg/m   Visual Acuity Right Eye Distance: 20/25 uncorrected Left Eye Distance: 20/30 uncorrected Bilateral Distance:    Right Eye Near:   Left Eye Near:    Bilateral Near:     Physical Exam  Constitutional: She is oriented to person, place, and time. She appears  well-developed and well-nourished.  HENT:  Head: Normocephalic.  Right Ear: Hearing, tympanic membrane, external ear and ear canal normal.  Left Ear: Hearing, tympanic membrane, external ear and ear canal normal.  Nose: Mucosal edema present. No rhinorrhea. Right sinus exhibits no maxillary sinus tenderness and no frontal sinus tenderness. Left sinus exhibits no frontal sinus tenderness.  Mouth/Throat: Uvula is midline and mucous membranes are normal. Normal dentition. Posterior oropharyngeal erythema present.  Eyes: Pupils  are equal, round, and reactive to light. Right eye exhibits no chemosis, no discharge, no exudate and no hordeolum. No foreign body present in the right eye. Left eye exhibits discharge. Left eye exhibits no exudate and no hordeolum. No foreign body present in the left eye. Right conjunctiva is not injected. Right conjunctiva has no hemorrhage. Left conjunctiva is injected. Left conjunctiva has no hemorrhage. Right pupil is reactive. Left pupil is reactive.    Left eyelid was up eyelid was swollen was also drooping some but the drooping is natural for her when she sick.  Neck: Normal range of motion.  Pulmonary/Chest: Effort normal.  Musculoskeletal: Normal range of motion. She exhibits no edema or deformity.  Lymphadenopathy:    She has cervical adenopathy.  Neurological: She is alert and oriented to person, place, and time. She has normal reflexes.  Skin: Skin is warm.  Psychiatric: She has a normal mood and affect.     UC Treatments / Results  Labs (all labs ordered are listed, but only abnormal results are displayed) Labs Reviewed  RAPID STREP SCREEN (NOT AT [facility])  CULTURE, GROUP A STREP [facility])    EKG  EKG Interpretation None       Radiology No results found.  Procedures Procedures (including critical care time)  Medications Ordered in UC Medications - No data to display   Initial Impression / Assessment and Plan / UC Course  I have reviewed the  triage vital signs and the nursing notes.  Pertinent labs & imaging results that were available during my care of the patient were reviewed by me and considered in my medical decision making (see chart for details).     Results for orders placed or performed during the hospital encounter of 08/12/16  Rapid strep screen  Result Value Ref Range   Streptococcus, Group A Screen (Direct) NEGATIVE NEGATIVE   Clinical Course   We'll place patient on Ceftin 500 mg 1 tablet twice day for 10 days Flonase today spray 2 puffs each nostril struck patient not to use the Flonase and also place her on Zyrtec-D one tablet twice a day. Follow-up PCP if not better and wonders 2 weeks.   Final Clinical Impressions(s) / UC Diagnoses   Final diagnoses:  Conjunctivitis of left eye  Acute pharyngitis, unspecified etiology  Acute sinusitis, recurrence not specified, unspecified location    New Prescriptions New Prescriptions   CEFUROXIME (CEFTIN) 500 MG TABLET    Take 1 tablet (500 mg total) by mouth 2 (two) times daily.   CETIRIZINE-PSEUDOEPHEDRINE (ZYRTEC-D) 5-120 MG TABLET    Take 1 tablet by mouth 2 (two) times daily as needed for allergies.   FLUTICASONE (FLONASE) 50 MCG/ACT NASAL SPRAY    Place 2 sprays into both nostrils daily.     Hassan Rowan, MD 08/12/16 [phone]

## 2016-08-15 LAB — CULTURE, GROUP A STREP (THRC)

## 2016-11-12 ENCOUNTER — Encounter: Payer: Self-pay | Admitting: *Deleted

## 2016-11-12 ENCOUNTER — Ambulatory Visit
Admission: EM | Admit: 2016-11-12 | Discharge: 2016-11-12 | Disposition: A | Discharge status: Home / Self Care | Payer: BLUE CROSS/BLUE SHIELD | Attending: Family Medicine | Admitting: Family Medicine

## 2016-11-12 DIAGNOSIS — Z23 Encounter for immunization: Secondary | ICD-10-CM | POA: Diagnosis not present

## 2016-11-12 MED ORDER — TETANUS-DIPHTH-ACELL PERTUSSIS 5-2.5-18.5 LF-MCG/0.5 IM SUSP
0.5000 mL | Freq: Once | INTRAMUSCULAR | Status: AC
  Administered 2016-11-12: 0.5 mL via INTRAMUSCULAR

## 2016-11-12 NOTE — ED Triage Notes (Signed)
Here for a TDAP in preparation for travel.

## 2016-11-19 IMAGING — CR DG KNEE STANDING AP BILAT
1 series · 1 of 1 positions shown · non-contrast
Comparison: None.

CLINICAL DATA: Pain

EXAM:
BILATERAL KNEES STANDING - 1 VIEW

[dg knee bilateral standing ap]
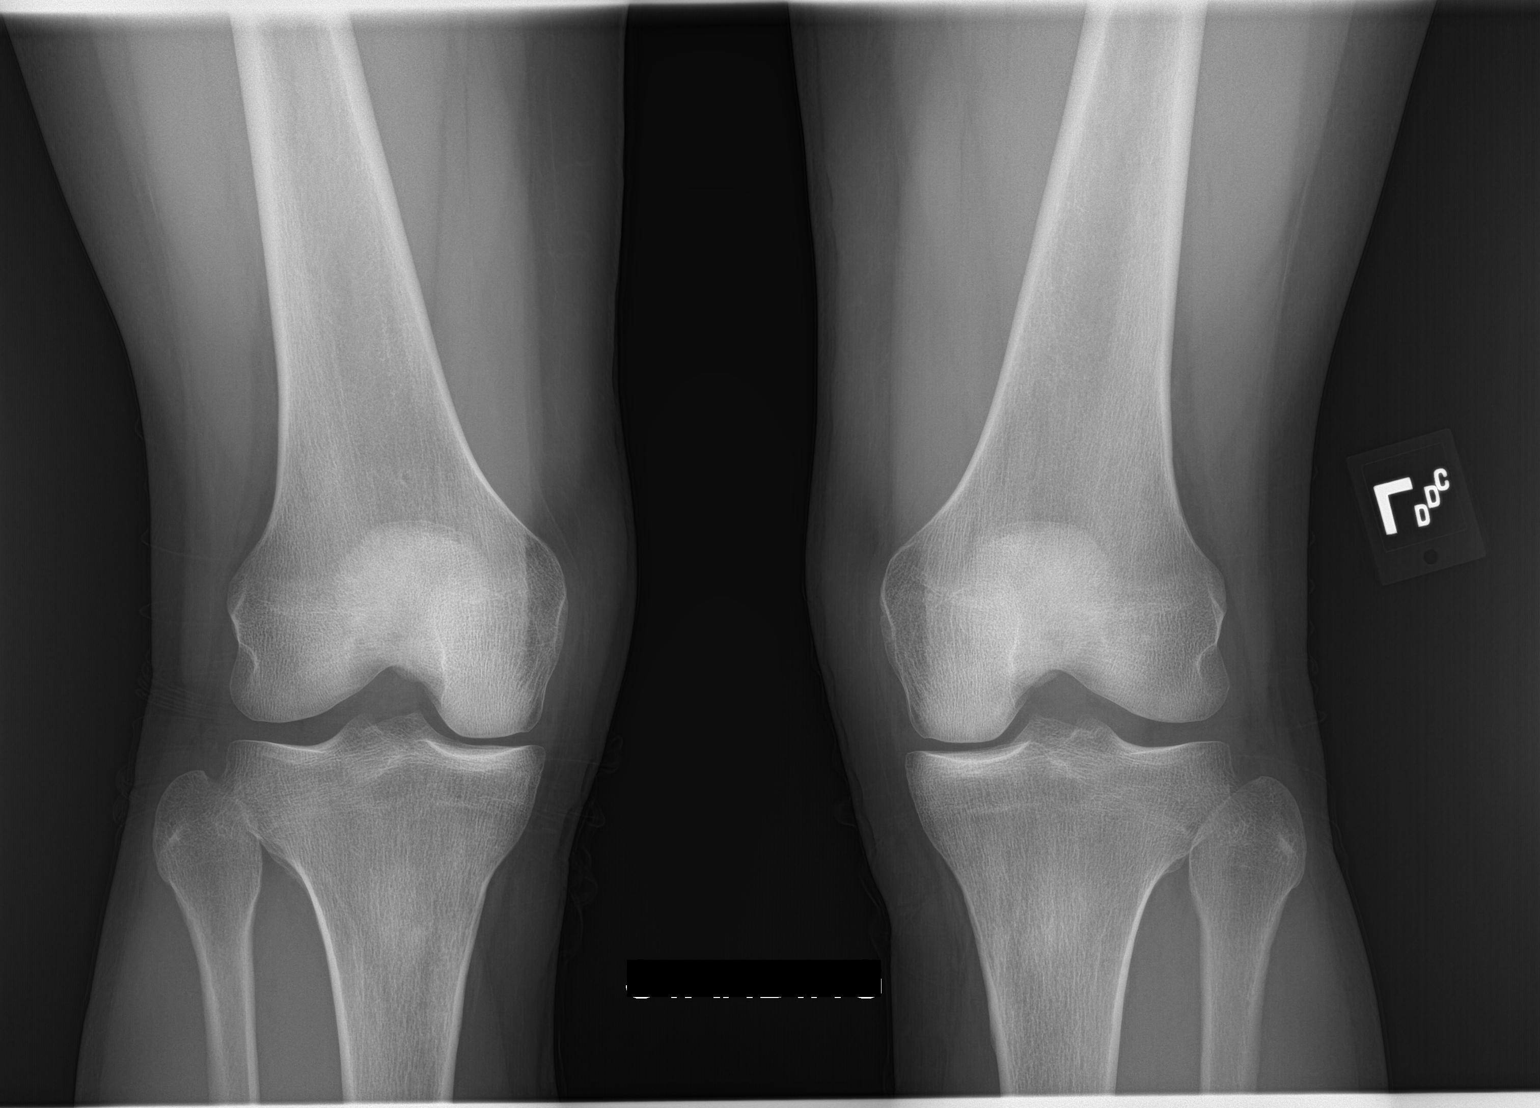

[1 of 1 positions shown; findings below may reference images not displayed]

FINDINGS: Standing frontal views show symmetric narrowing medially. No
fracture or dislocation. No erosive change.
IMPRESSION: No fracture or dislocation.  Narrowing medially.

## 2017-02-15 DIAGNOSIS — F319 Bipolar disorder, unspecified: Secondary | ICD-10-CM | POA: Diagnosis not present

## 2017-03-24 ENCOUNTER — Encounter: Payer: Self-pay | Admitting: Advanced Practice Midwife

## 2017-03-24 ENCOUNTER — Ambulatory Visit (INDEPENDENT_AMBULATORY_CARE_PROVIDER_SITE_OTHER): Payer: BLUE CROSS/BLUE SHIELD | Admitting: Advanced Practice Midwife

## 2017-03-24 VITALS — BP 112/70 | Ht 65.0 in | Wt 151.0 lb

## 2017-03-24 DIAGNOSIS — Z01419 Encounter for gynecological examination (general) (routine) without abnormal findings: Secondary | ICD-10-CM

## 2017-03-24 DIAGNOSIS — Z124 Encounter for screening for malignant neoplasm of cervix: Secondary | ICD-10-CM | POA: Diagnosis not present

## 2017-03-24 MED ORDER — NORETHINDRONE ACET-ETHINYL EST 1-20 MG-MCG PO TABS: 1.0000 | ORAL_TABLET | Freq: Every day | ORAL | 11 refills | Status: DC

## 2017-03-24 NOTE — Patient Instructions (Signed)
Health Maintenance, Female Adopting a healthy lifestyle and getting preventive care can go a long way to promote health and wellness. Talk with your health care provider about what schedule of regular examinations is right for you. This is a good chance for you to check in with your provider about disease prevention and staying healthy. In between checkups, there are plenty of things you can do on your own. Experts have done a lot of research about which lifestyle changes and preventive measures are most likely to keep you healthy. Ask your health care provider for more information. Weight and diet Eat a healthy diet  Be sure to include plenty of vegetables, fruits, low-fat dairy products, and lean protein.  Do not eat a lot of foods high in solid fats, added sugars, or salt.  Get regular exercise. This is one of the most important things you can do for your health.  Most adults should exercise for at least 150 minutes each week. The exercise should increase your heart rate and make you sweat (moderate-intensity exercise).  Most adults should also do strengthening exercises at least twice a week. This is in addition to the moderate-intensity exercise. Maintain a healthy weight  Body mass index (BMI) is a measurement that can be used to identify possible weight problems. It estimates body fat based on height and weight. Your health care provider can help determine your BMI and help you achieve or maintain a healthy weight.  For females 62 years of age and older:  A BMI below 18.5 is considered underweight.  A BMI of 18.5 to 24.9 is normal.  A BMI of 25 to 29.9 is considered overweight.  A BMI of 30 and above is considered obese. Watch levels of cholesterol and blood lipids  You should start having your blood tested for lipids and cholesterol at 47 years of age, then have this test every 5 years.  You may need to have your cholesterol levels checked more often if:  Your lipid or  cholesterol levels are high.  You are older than 47 years of age.  You are at high risk for heart disease. Cancer screening Lung Cancer  Lung cancer screening is recommended for adults 59-21 years old who are at high risk for lung cancer because of a history of smoking.  A yearly low-dose CT scan of the lungs is recommended for people who:  Currently smoke.  Have quit within the past 15 years.  Have at least a 30-pack-year history of smoking. A pack year is smoking an average of one pack of cigarettes a day for 1 year.  Yearly screening should continue until it has been 15 years since you quit.  Yearly screening should stop if you develop a health problem that would prevent you from having lung cancer treatment. Breast Cancer  Practice breast self-awareness. This means understanding how your breasts normally appear and feel.  It also means doing regular breast self-exams. Let your health care provider know about any changes, no matter how small.  If you are in your 20s or 30s, you should have a clinical breast exam (CBE) by a health care provider every 1-3 years as part of a regular health exam.  If you are 25 or older, have a CBE every year. Also consider having a breast X-ray (mammogram) every year.  If you have a family history of breast cancer, talk to your health care provider about genetic screening.  If you are at high risk for breast cancer, talk  to your health care provider about having an MRI and a mammogram every year.  Breast cancer gene (BRCA) assessment is recommended for women who have family members with BRCA-related cancers. BRCA-related cancers include:  Breast.  Ovarian.  Tubal.  Peritoneal cancers.  Results of the assessment will determine the need for genetic counseling and BRCA1 and BRCA2 testing. Cervical Cancer  Your health care provider may recommend that you be screened regularly for cancer of the pelvic organs (ovaries, uterus, and vagina).  This screening involves a pelvic examination, including checking for microscopic changes to the surface of your cervix (Pap test). You may be encouraged to have this screening done every 3 years, beginning at age 24.  For women ages 66-65, health care providers may recommend pelvic exams and Pap testing every 3 years, or they may recommend the Pap and pelvic exam, combined with testing for human papilloma virus (HPV), every 5 years. Some types of HPV increase your risk of cervical cancer. Testing for HPV may also be done on women of any age with unclear Pap test results.  Other health care providers may not recommend any screening for nonpregnant women who are considered low risk for pelvic cancer and who do not have symptoms. Ask your health care provider if a screening pelvic exam is right for you.  If you have had past treatment for cervical cancer or a condition that could lead to cancer, you need Pap tests and screening for cancer for at least 20 years after your treatment. If Pap tests have been discontinued, your risk factors (such as having a new sexual partner) need to be reassessed to determine if screening should resume. Some women have medical problems that increase the chance of getting cervical cancer. In these cases, your health care provider may recommend more frequent screening and Pap tests. Colorectal Cancer  This type of cancer can be detected and often prevented.  Routine colorectal cancer screening usually begins at 47 years of age and continues through 47 years of age.  Your health care provider may recommend screening at an earlier age if you have risk factors for colon cancer.  Your health care provider may also recommend using home test kits to check for hidden blood in the stool.  A small camera at the end of a tube can be used to examine your colon directly (sigmoidoscopy or colonoscopy). This is done to check for the earliest forms of colorectal cancer.  Routine  screening usually begins at age 41.  Direct examination of the colon should be repeated every 5-10 years through 47 years of age. However, you may need to be screened more often if early forms of precancerous polyps or small growths are found. Skin Cancer  Check your skin from head to toe regularly.  Tell your health care provider about any new moles or changes in moles, especially if there is a change in a mole's shape or color.  Also tell your health care provider if you have a mole that is larger than the size of a pencil eraser.  Always use sunscreen. Apply sunscreen liberally and repeatedly throughout the day.  Protect yourself by wearing long sleeves, pants, a wide-brimmed hat, and sunglasses whenever you are outside. Heart disease, diabetes, and high blood pressure  High blood pressure causes heart disease and increases the risk of stroke. High blood pressure is more likely to develop in:  People who have blood pressure in the high end of the normal range (130-139/85-89 mm Hg).  People who are overweight or obese.  People who are African American.  If you are 59-24 years of age, have your blood pressure checked every 3-5 years. If you are 34 years of age or older, have your blood pressure checked every year. You should have your blood pressure measured twice-once when you are at a hospital or clinic, and once when you are not at a hospital or clinic. Record the average of the two measurements. To check your blood pressure when you are not at a hospital or clinic, you can use:  An automated blood pressure machine at a pharmacy.  A home blood pressure monitor.  If you are between 29 years and 60 years old, ask your health care provider if you should take aspirin to prevent strokes.  Have regular diabetes screenings. This involves taking a blood sample to check your fasting blood sugar level.  If you are at a normal weight and have a low risk for diabetes, have this test once  every three years after 47 years of age.  If you are overweight and have a high risk for diabetes, consider being tested at a younger age or more often. Preventing infection Hepatitis B  If you have a higher risk for hepatitis B, you should be screened for this virus. You are considered at high risk for hepatitis B if:  You were born in a country where hepatitis B is common. Ask your health care provider which countries are considered high risk.  Your parents were born in a high-risk country, and you have not been immunized against hepatitis B (hepatitis B vaccine).  You have HIV or AIDS.  You use needles to inject street drugs.  You live with someone who has hepatitis B.  You have had sex with someone who has hepatitis B.  You get hemodialysis treatment.  You take certain medicines for conditions, including cancer, organ transplantation, and autoimmune conditions. Hepatitis C  Blood testing is recommended for:  Everyone born from [birth year] through 1966.  Anyone with known risk factors for hepatitis C. Sexually transmitted infections (STIs)  You should be screened for sexually transmitted infections (STIs) including gonorrhea and chlamydia if:  You are sexually active and are younger than 47 years of age.  You are older than 47 years of age and your health care provider tells you that you are at risk for this type of infection.  Your sexual activity has changed since you were last screened and you are at an increased risk for chlamydia or gonorrhea. Ask your health care provider if you are at risk.  If you do not have HIV, but are at risk, it may be recommended that you take a prescription medicine daily to prevent HIV infection. This is called pre-exposure prophylaxis (PrEP). You are considered at risk if:  You are sexually active and do not regularly use condoms or know the HIV status of your partner(s).  You take drugs by injection.  You are sexually active with a partner  who has HIV. Talk with your health care provider about whether you are at high risk of being infected with HIV. If you choose to begin PrEP, you should first be tested for HIV. You should then be tested every 3 months for as long as you are taking PrEP. Pregnancy  If you are premenopausal and you may become pregnant, ask your health care provider about preconception counseling.  If you may become pregnant, take 400 to 800 micrograms (mcg) of folic acid  every day.  If you want to prevent pregnancy, talk to your health care provider about birth control (contraception). Osteoporosis and menopause  Osteoporosis is a disease in which the bones lose minerals and strength with aging. This can result in serious bone fractures. Your risk for osteoporosis can be identified using a bone density scan.  If you are 4 years of age or older, or if you are at risk for osteoporosis and fractures, ask your health care provider if you should be screened.  Ask your health care provider whether you should take a calcium or vitamin D supplement to lower your risk for osteoporosis.  Menopause may have certain physical symptoms and risks.  Hormone replacement therapy may reduce some of these symptoms and risks. Talk to your health care provider about whether hormone replacement therapy is right for you. Follow these instructions at home:  Schedule regular health, dental, and eye exams.  Stay current with your immunizations.  Do not use any tobacco products including cigarettes, chewing tobacco, or electronic cigarettes.  If you are pregnant, do not drink alcohol.  If you are breastfeeding, limit how much and how often you drink alcohol.  Limit alcohol intake to no more than 1 drink per day for nonpregnant women. One drink equals 12 ounces of beer, 5 ounces of wine, or 1 ounces of hard liquor.  Do not use street drugs.  Do not share needles.  Ask your health care provider for help if you need support  or information about quitting drugs.  Tell your health care provider if you often feel depressed.  Tell your health care provider if you have ever been abused or do not feel safe at home. This information is not intended to replace advice given to you by your health care provider. Make sure you discuss any questions you have with your health care provider. Document Released: 06/29/2011 Document Revised: 05/21/2016 Document Reviewed: 09/17/2015 Elsevier Interactive Patient Education  2017 [employer].

## 2017-03-24 NOTE — Progress Notes (Signed)
Patient ID: Stephanie Mcguire, female   DOB: 08/06/1971, 46 y.o.   MRN: [id number]     Gynecology Annual Exam  PCP: No PCP Per Patient  Chief Complaint:  Chief Complaint  Patient presents with  . Annual Exam    History of Present Illness: Patient is a 46 y.o. G0P0000 presents for annual exam. The patient has complaints of perimenopausal symptoms, hot flashes, vaginal dryness.  LMP: Patient's last menstrual period was 03/22/2017. Average Interval: regular, 28 days Duration of flow: 2 days Heavy Menses: no Clots: no Intermenstrual Bleeding: no Postcoital Bleeding: no Dysmenorrhea: no   The patient is not sexually active. She currently uses OCPs for regulating her period. She admits to dyspareunia.  The patient does perform self breast exams.  There is no notable family history of breast or ovarian cancer in her family.  The patient wears seatbelts: yes.   The patient has regular exercise: yes.    The patient admits to Bipolar and is treated by another provider.    Review of Systems: Review of Systems  Constitutional: Negative.   HENT: Negative.   Eyes: Negative.   Respiratory: Negative.   Cardiovascular: Negative.   Gastrointestinal: Negative.   Genitourinary: Negative.   Musculoskeletal: Negative.   Skin: Negative.   Neurological: Negative.   Endo/Heme/Allergies: Negative.   Psychiatric/Behavioral: Negative.     Past Medical History:  Past Medical History:  Diagnosis Date  . Asthma   . Bipolar 2 disorder (HCC)   . Thyroid disease     Past Surgical History:  Past Surgical History:  Procedure Laterality Date  . BUNIONECTOMY    . MANDIBLE SURGERY  2009  . RECONSTRUCTION OF EYELID  1989 and 1990    Gynecologic History:  Patient's last menstrual period was 03/22/2017. Contraception: OCP (estrogen/progesterone) Last Pap: Results were: no abnormalities  Last mammogram: 1 year ago was normal Obstetric History: G0P0000  Family History:  History reviewed. No  pertinent family history.  Social History:  Social History   Social History  . Marital status: Married    Spouse name: N/A  . Number of children: 2 adopted children ages 311 and 1313  . Years of education: N/A   Occupational History  . Not on file.   Social History Main Topics  . Smoking status: Never Smoker  . Smokeless tobacco: Never Used  . Alcohol use Yes  . Drug use: No  . Sexual activity: Not Currently    Birth control/ protection: Pill   Other Topics Concern  . Not on file   Social History Narrative  . No narrative on file    Allergies:  No Known Allergies  Medications: Prior to Admission medications   Medication Sig Start Date End Date Taking? Authorizing Provider  buPROPion (WELLBUTRIN XL) 150 MG 24 hr tablet Take 150 mg by mouth daily.   Yes Historical Provider, MD  diclofenac (VOLTAREN) 75 MG EC tablet Take 75 mg by mouth 2 (two) times daily.   Yes Historical Provider, MD  divalproex (DEPAKOTE) 500 MG DR tablet Take 1,000 mg by mouth daily.    Yes Historical Provider, MD  fluticasone (FLONASE) 50 MCG/ACT nasal spray Place 2 sprays into both nostrils daily. 08/12/16  Yes Hassan RowanEugene Wade, MD  levothyroxine (SYNTHROID, LEVOTHROID) 100 MCG tablet Take 100 mcg by mouth daily before breakfast.   Yes Historical Provider, MD  montelukast (SINGULAIR) 10 MG tablet Take 10 mg by mouth at bedtime.   Yes Historical Provider, MD  cefUROXime (CEFTIN) 500 MG tablet  Take 1 tablet (500 mg total) by mouth 2 (two) times daily. Patient not taking: Reported on 03/24/2017 08/12/16   Hassan Rowan, MD  cetirizine-pseudoephedrine (ZYRTEC-D) 5-120 MG tablet Take 1 tablet by mouth 2 (two) times daily as needed for allergies. Patient not taking: Reported on 03/24/2017 08/12/16   Hassan Rowan, MD  clonazePAM (KLONOPIN) 0.5 MG tablet Take 0.5 mg by mouth daily.    Historical Provider, MD  escitalopram (LEXAPRO) 20 MG tablet Take 20 mg by mouth daily.    Historical Provider, MD  norethindrone-ethinyl  estradiol (MICROGESTIN,JUNEL,LOESTRIN) 1-20 MG-MCG tablet Take 1 tablet by mouth daily. 03/24/17   Tresea Mall, CNM  zolpidem (AMBIEN) 5 MG tablet Take 5 mg by mouth at bedtime.    Historical Provider, MD    Physical Exam Vitals: Blood pressure 112/70, height 5\' 5"  (1.651 m), weight 151 lb (68.5 kg), last menstrual period 03/22/2017.  General: NAD HEENT: normocephalic, anicteric Thyroid: no enlargement, no palpable nodules Pulmonary: No increased work of breathing, CTAB Cardiovascular: RRR, distal pulses 2+ Breast: Breast symmetrical, no tenderness, no palpable nodules or masses, no skin or nipple retraction present, no nipple discharge.  No axillary or supraclavicular lymphadenopathy. Abdomen: NABS, soft, non-tender, non-distended.  Umbilicus without lesions.  No hepatomegaly, splenomegaly or masses palpable. No evidence of hernia  Genitourinary: pt did not tolerate exam well due to discomfort with speculum  External: Normal external female genitalia.  Normal urethral meatus, normal  Bartholin's and Skene's glands.    Vagina: Normal vaginal mucosa, no evidence of prolapse.    Cervix: Grossly normal in appearance, no bleeding, no CMT  Uterus: Non-enlarged, mobile, normal contour.    Adnexa: ovaries non-enlarged, no adnexal masses  Rectal: deferred  Lymphatic: no evidence of inguinal lymphadenopathy Extremities: no edema, erythema, or tenderness Neurologic: Grossly intact Psychiatric: mood appropriate, affect full     Assessment: 46 y.o. G0P0000 well woman exam with PAP smear   Plan: Problem List Items Addressed This Visit    None    Visit Diagnoses    Routine cervical smear    -  Primary   Relevant Orders   IGP, Aptima HPV   Well woman exam with routine gynecological exam       Relevant Medications   norethindrone-ethinyl estradiol (MICROGESTIN,JUNEL,LOESTRIN) 1-20 MG-MCG tablet   Other Relevant Orders   IGP, Aptima HPV      1) Mammogram - recommend yearly screening  mammogram.    2) Encouraged continuation of healthy lifestyle diet and exercise  3) ASCCP guidelines and rational discussed.  Patient opts for yearly screening interval  4) Return to clinic for annual exam   Tresea Mall, CNM

## 2017-03-27 LAB — IGP, APTIMA HPV
HPV Aptima: NEGATIVE
PAP Smear Comment: 0

## 2017-04-01 NOTE — Addendum Note (Signed)
Addended by: Tresea Mall on: 04/01/2017 12:27 PM   Modules accepted: Orders

## 2017-04-06 ENCOUNTER — Other Ambulatory Visit: Payer: Self-pay

## 2017-04-06 ENCOUNTER — Telehealth: Payer: Self-pay | Admitting: Advanced Practice Midwife

## 2017-04-06 DIAGNOSIS — Z01419 Encounter for gynecological examination (general) (routine) without abnormal findings: Secondary | ICD-10-CM

## 2017-04-06 MED ORDER — NORETHINDRONE ACET-ETHINYL EST 1-20 MG-MCG PO TABS: 1.0000 | ORAL_TABLET | Freq: Every day | ORAL | 4 refills | Status: DC

## 2017-04-06 MED ORDER — NORETHINDRONE ACET-ETHINYL EST 1-20 MG-MCG PO TABS: 1.0000 | ORAL_TABLET | Freq: Every day | ORAL | 11 refills | Status: DC

## 2017-04-06 NOTE — Telephone Encounter (Signed)
Pt is calling stating her Birth control pills where not put into [facility]. Pt states that she knows it was put into [facility] but doesn't needs it there. Pt is very upset her Prescription isn't were it needs to be. Please advise PT. CB# [phone]

## 2017-04-06 NOTE — Telephone Encounter (Signed)
RX has been sent to correct pharm

## 2017-05-05 DIAGNOSIS — L720 Epidermal cyst: Secondary | ICD-10-CM | POA: Diagnosis not present

## 2017-08-04 DIAGNOSIS — E079 Disorder of thyroid, unspecified: Secondary | ICD-10-CM | POA: Diagnosis not present

## 2017-08-04 DIAGNOSIS — E038 Other specified hypothyroidism: Secondary | ICD-10-CM | POA: Diagnosis not present

## 2017-08-04 DIAGNOSIS — E063 Autoimmune thyroiditis: Secondary | ICD-10-CM | POA: Diagnosis not present

## 2017-08-11 DIAGNOSIS — E039 Hypothyroidism, unspecified: Secondary | ICD-10-CM | POA: Diagnosis not present

## 2017-09-27 DIAGNOSIS — D2371 Other benign neoplasm of skin of right lower limb, including hip: Secondary | ICD-10-CM | POA: Diagnosis not present

## 2017-10-04 ENCOUNTER — Ambulatory Visit: Payer: BLUE CROSS/BLUE SHIELD | Admitting: Podiatry

## 2017-10-06 DIAGNOSIS — E039 Hypothyroidism, unspecified: Secondary | ICD-10-CM | POA: Diagnosis not present

## 2017-10-09 ENCOUNTER — Ambulatory Visit
Admission: EM | Admit: 2017-10-09 | Discharge: 2017-10-09 | Disposition: A | Discharge status: Home / Self Care | Payer: BLUE CROSS/BLUE SHIELD | Attending: Emergency Medicine | Admitting: Emergency Medicine

## 2017-10-09 ENCOUNTER — Encounter: Payer: Self-pay | Admitting: Emergency Medicine

## 2017-10-09 DIAGNOSIS — B001 Herpesviral vesicular dermatitis: Secondary | ICD-10-CM | POA: Diagnosis not present

## 2017-10-09 MED ORDER — VALACYCLOVIR HCL 1 G PO TABS: 2000.0000 mg | ORAL_TABLET | Freq: Two times a day (BID) | ORAL | 0 refills | Status: AC

## 2017-10-09 MED ORDER — TRIAMCINOLONE ACETONIDE 0.1 % MT PSTE: 1.0000 "application " | PASTE | Freq: Two times a day (BID) | OROMUCOSAL | 12 refills | Status: DC

## 2017-10-09 NOTE — ED Triage Notes (Signed)
Patient in today c/o canker sores in her mouth x 5 days. Patient has been using topical OTC Oragel and Oragel rinse and peroxide.

## 2017-10-09 NOTE — Discharge Instructions (Signed)
Use B12 1000 g daily.

## 2017-10-09 NOTE — ED Provider Notes (Signed)
[facility]    CSN: [id number] Arrival date & time: 10/09/17  4098     History   Chief Complaint Chief Complaint  Patient presents with  . Mouth Lesions    HPI Stephanie Mcguire is a 46 y.o. female.   HPI  This 46 year old female who has a history of recurrent cold sores. Sates that she will get around this time of the year. This time it started around 5 weeks ago. However it is just been one breakout after another. The most recent one is very painful to the surface of her mouth. Several of these lesions scattered. None are external. She has no fever or chills.        Past Medical History:  Diagnosis Date  . Asthma   . Bipolar 2 disorder (HCC)   . Thyroid disease     Patient Active Problem List   Diagnosis Date Noted  . Interstitial lung disease (HCC) 10/14/2015  . Dyspnea on exertion 10/14/2015  . Congenital hypothyroidism without goiter 10/14/2015  . Abnormal chest CT 10/14/2015  . 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 BUNIONECTOMY    . MANDIBLE SURGERY  2009  . RECONSTRUCTION OF EYELID  1989 and 1990    OB History    Gravida Para Term Preterm AB Living   0 0 0 0 0 0   SAB TAB Ectopic Multiple Live Births   0 0 0 0 0       Home Medications    Prior to Admission medications   Medication Sig Start Date End Date Taking? Authorizing Provider  buPROPion (WELLBUTRIN XL) 150 MG 24 hr tablet Take 150 mg by mouth daily.   Yes [provider]  divalproex (DEPAKOTE ER) 500 MG 24 hr tablet  01/09/17  Yes [provider]  levothyroxine (SYNTHROID, LEVOTHROID) 100 MCG tablet Take 100 mcg by mouth daily before breakfast.   Yes [provider]  montelukast (SINGULAIR) 10 MG tablet Take 10 mg by mouth at bedtime.   Yes [provider]  norethindrone-ethinyl estradiol (MICROGESTIN,JUNEL,LOESTRIN) 1-20 MG-MCG tablet Take 1 tablet by mouth daily. 04/06/17 10/09/17 Yes  Tresea Mall, CNM  cefUROXime (CEFTIN) 500 MG tablet Take 1 tablet (500 mg total) by mouth 2 (two) times daily. Patient not taking: Reported on 03/24/2017 08/12/16   Hassan Rowan, MD  cetirizine-pseudoephedrine (ZYRTEC-D) 5-120 MG tablet Take 1 tablet by mouth 2 (two) times daily as needed for allergies. Patient not taking: Reported on 03/24/2017 08/12/16   Hassan Rowan, MD  clonazePAM (KLONOPIN) 0.5 MG tablet Take 0.5 mg by mouth daily.    [provider]  diclofenac (VOLTAREN) 75 MG EC tablet Take 75 mg by mouth 2 (two) times daily.    [provider]  escitalopram (LEXAPRO) 20 MG tablet Take 20 mg by mouth daily.    [provider]  fluconazole (DIFLUCAN) 200 MG tablet  01/15/17   [provider]  fluticasone (FLONASE) 50 MCG/ACT nasal spray Place 2 sprays into both nostrils daily. 08/12/16   Hassan Rowan, MD  liothyronine (CYTOMEL) 5 MCG tablet  01/01/17   [provider]  traMADol Janean Sark) 50 MG tablet Take by mouth. 02/18/11   [provider]  triamcinolone (KENALOG) 0.1 % paste Use as directed 1 application in the mouth or throat 2 (two) times daily. 10/09/17   Lutricia Feil, PA-C  valACYclovir (VALTREX) 1000 MG tablet Take 2 tablets (2,000 mg total) by mouth 2 (  two) times daily. Use for 1 day. Start as soon as possible after onset of symptoms. 10/09/17 10/23/17  Lutricia Feil, PA-C  zolpidem (AMBIEN) 5 MG tablet Take 5 mg by mouth at bedtime.    [provider]    Family History Family History  Problem Relation Age of Onset  . Parkinson's disease Mother   . Hypertension Father     Social History Social History  Substance Use Topics  . Smoking status: Never Smoker  . Smokeless tobacco: Never Used  . Alcohol use Yes     Allergies   Patient has no known allergies.   Review of Systems Review of Systems  Constitutional: Positive for activity change. Negative for appetite change, chills, fatigue and fever.    Skin: Positive for rash.  All other systems reviewed and are negative.    Physical Exam Triage Vital Signs ED Triage Vitals  Enc Vitals Group     BP 10/09/17 0947 112/78     Pulse Rate 10/09/17 0947 92     Resp 10/09/17 0947 16     Temp 10/09/17 0947 97.7 F (36.5 C)     Temp Source 10/09/17 0947 Oral     SpO2 10/09/17 0947 98 %     Weight 10/09/17 0947 142 lb (64.4 kg)     Height 10/09/17 0947  (1.651 m)     Head Circumference --      Peak Flow --      Pain Score 10/09/17 0948 7     Pain Loc --      Pain Edu? --      Excl. in GC? --    No data found.   Updated Vital Signs BP 112/78 (BP Location: Left Arm)   Pulse 92   Temp 97.7 F (36.5 C) (Oral)   Resp 16   Ht  (1.651 m)   Wt 142 lb (64.4 kg)   LMP 10/04/2017   SpO2 98%   BMI 23.63 kg/m   Visual Acuity Right Eye Distance:   Left Eye Distance:   Bilateral Distance:    Right Eye Near:   Left Eye Near:    Bilateral Near:     Physical Exam  Constitutional: She is oriented to person, place, and time. She appears well-developed and well-nourished. No distress.  HENT:  Head: Normocephalic.  Right Ear: External ear normal.  Left Ear: External ear normal.  Nose: Nose normal.  Mouth/Throat: Oropharynx is clear and moist. No oropharyngeal exudate.  Eyes: Pupils are equal, round, and reactive to light. Right eye exhibits no discharge. Left eye exhibits no discharge.  Neck: Normal range of motion.  Musculoskeletal: Normal range of motion.  Lymphadenopathy:    She has no cervical adenopathy.  Neurological: She is alert and oriented to person, place, and time.  Skin: Skin is warm and dry. She is not diaphoretic.  Psychiatric: She has a normal mood and affect. Her behavior is normal. Judgment and thought content normal.  Nursing note and vitals reviewed.    UC Treatments / Results  Labs (all labs ordered are listed, but only abnormal results are displayed) Labs Reviewed - No data to  display  EKG  EKG Interpretation None       Radiology No results found.  Procedures Procedures (including critical care time)  Medications Ordered in UC Medications - No data to display   Initial Impression / Assessment and Plan / UC Course  I have reviewed the triage vital signs and  the nursing notes.  Pertinent labs & imaging results that were available during my care of the patient were reviewed by me and considered in my medical decision making (see chart for details).     Plan: 1. Test/x-ray results and diagnosis reviewed with patient 2. rx as per orders; risks, benefits, potential side effects reviewed with patient 3. Recommend supportive treatment with use of the B12 1000 g daily for prophylaxis. We'll start her on Valtrex even though it has been in progress for quite some time and I told the patient that this may not be as effective if she would started earlier on when she first had symptoms. We'll supplyt her with some Kenalog in [place] for pain control topically and have advised her to consider using ibuprofen. She should start the Valtrex much earlier on her next recurrence. 4. F/u prn if symptoms worsen or don't improve   Final Clinical Impressions(s) / UC Diagnoses   Final diagnoses:  Recurrent herpes labialis    New Prescriptions Discharge Medication List as of 10/09/2017 11:50 AM    START taking these medications   Details  triamcinolone (KENALOG) 0.1 % paste Use as directed 1 application in the mouth or throat 2 (two) times daily., Starting Sat 10/09/2017, Normal    valACYclovir (VALTREX) 1000 MG tablet Take 2 tablets (2,000 mg total) by mouth 2 (two) times daily. Use for 1 day. Start as soon as possible after onset of symptoms., Starting Sat 10/09/2017, Until Sat 10/23/2017, Normal         Controlled Substance Prescriptions [place] Controlled Substance Registry consulted? Not Applicable   Lutricia Feil, PA-C 10/09/17 1202

## 2017-10-22 DIAGNOSIS — Z23 Encounter for immunization: Secondary | ICD-10-CM | POA: Diagnosis not present

## 2017-12-10 DIAGNOSIS — F3342 Major depressive disorder, recurrent, in full remission: Secondary | ICD-10-CM | POA: Diagnosis not present

## 2018-06-15 ENCOUNTER — Other Ambulatory Visit: Payer: Self-pay | Admitting: Advanced Practice Midwife

## 2018-06-15 DIAGNOSIS — Z01419 Encounter for gynecological examination (general) (routine) without abnormal findings: Secondary | ICD-10-CM

## 2018-08-08 DIAGNOSIS — F3342 Major depressive disorder, recurrent, in full remission: Secondary | ICD-10-CM | POA: Diagnosis not present

## 2018-08-08 DIAGNOSIS — E039 Hypothyroidism, unspecified: Secondary | ICD-10-CM | POA: Diagnosis not present

## 2018-08-12 DIAGNOSIS — E039 Hypothyroidism, unspecified: Secondary | ICD-10-CM | POA: Diagnosis not present

## 2018-09-09 ENCOUNTER — Other Ambulatory Visit: Payer: Self-pay | Admitting: Advanced Practice Midwife

## 2018-09-09 DIAGNOSIS — Z01419 Encounter for gynecological examination (general) (routine) without abnormal findings: Secondary | ICD-10-CM

## 2018-09-16 DIAGNOSIS — H9192 Unspecified hearing loss, left ear: Secondary | ICD-10-CM | POA: Diagnosis not present

## 2018-09-16 DIAGNOSIS — H938X3 Other specified disorders of ear, bilateral: Secondary | ICD-10-CM | POA: Diagnosis not present

## 2018-12-26 ENCOUNTER — Other Ambulatory Visit (HOSPITAL_COMMUNITY)
Admission: RE | Admit: 2018-12-26 | Discharge: 2018-12-26 | Disposition: A | Discharge status: Home / Self Care | Payer: BLUE CROSS/BLUE SHIELD | Source: Ambulatory Visit | Attending: Obstetrics and Gynecology | Admitting: Obstetrics and Gynecology

## 2018-12-26 ENCOUNTER — Encounter: Payer: Self-pay | Admitting: Obstetrics and Gynecology

## 2018-12-26 ENCOUNTER — Other Ambulatory Visit: Payer: Self-pay

## 2018-12-26 ENCOUNTER — Ambulatory Visit (INDEPENDENT_AMBULATORY_CARE_PROVIDER_SITE_OTHER): Payer: BLUE CROSS/BLUE SHIELD | Admitting: Obstetrics and Gynecology

## 2018-12-26 VITALS — BP 122/72 | HR 110 | Ht 65.0 in | Wt 153.0 lb

## 2018-12-26 DIAGNOSIS — Z124 Encounter for screening for malignant neoplasm of cervix: Secondary | ICD-10-CM | POA: Diagnosis not present

## 2018-12-26 DIAGNOSIS — Z01419 Encounter for gynecological examination (general) (routine) without abnormal findings: Secondary | ICD-10-CM

## 2018-12-26 DIAGNOSIS — Z1339 Encounter for screening examination for other mental health and behavioral disorders: Secondary | ICD-10-CM

## 2018-12-26 DIAGNOSIS — Z1331 Encounter for screening for depression: Secondary | ICD-10-CM

## 2018-12-26 LAB — HM PAP SMEAR: HM Pap smear: NORMAL

## 2018-12-26 LAB — RESULTS CONSOLE HPV: CHL HPV: NEGATIVE

## 2018-12-26 NOTE — Progress Notes (Signed)
Gynecology Annual Exam  PCP: Patient, No Pcp Per  Chief Complaint  Patient presents with  . Gynecologic Exam    No complaints    History of Present Illness:  Ms. Stephanie Mcguire is a 47 y.o. G0P0000 who LMP was Patient's last menstrual period was 11/29/2018., presents today for her annual examination.  Her menses are regular every 28-30 days, lasting 3 day(s).  Dysmenorrhea mild, occurring first 1-2 days of flow. She does not have intermenstrual bleeding.  She does have vasomotor sx. These have been present for 1.5 years.   She is rarely sexually active due to discomfort with sex. This has been present for about 15 years.  Last Pap: 2 years  Results were: no abnormalities /neg HPV DNA.  Hx of STDs: none  Last mammogram: nearly 3 years  Results were: normal--routine follow-up in 12 months There is no FH of breast cancer. There is no FH of ovarian cancer. The patient does do self-breast exams.  Colonoscopy: never had DEXA: has not been screened for osteoporosis  Tobacco use: The patient denies current or previous tobacco use. Alcohol use: social drinker Exercise: moderately active  The patient wears seatbelts: yes.     She states that she has unexplained pelvic pain, which has been present since her early 30s.  Mostly the pain is during intercourse.  It is also painful to place a tampon.  She has sex very infrequently.  She believes that the pain can be with initial penetration. In the past (in her 30s), she tried a surgery to make a small incision to widen the opening.  She also took steroids, which did not go well.  She [facility], which did not help a lot.    Past Medical History:  Diagnosis Date  . Asthma   . Bipolar 2 disorder (HCC)   . Thyroid disease     Past Surgical History:  Procedure Laterality Date  . BUNIONECTOMY    . MANDIBLE SURGERY  2009  . RECONSTRUCTION OF EYELID  1989 and 1990    Prior to Admission medications   Medication  Sig Start Date End Date Taking? Authorizing Provider  buPROPion (WELLBUTRIN XL) 150 MG 24 hr tablet Take 150 mg by mouth daily.   Yes [provider]  divalproex (DEPAKOTE ER) 500 MG 24 hr tablet  01/09/17  Yes [provider]  levothyroxine (SYNTHROID, LEVOTHROID) 100 MCG tablet Take 100 mcg by mouth daily before breakfast.   Yes [provider]  norethindrone-ethinyl estradiol (MICROGESTIN,JUNEL,LOESTRIN) 1-20 MG-MCG tablet TAKE 1 TABLET DAILY 06/15/18  Yes Tresea MallGledhill, Jane, CNM  cefUROXime (CEFTIN) 500 MG tablet Take 1 tablet (500 mg total) by mouth 2 (two) times daily. Patient not taking: Reported on 03/24/2017 08/12/16   Hassan RowanWade, Eugene, MD  cetirizine-pseudoephedrine (ZYRTEC-D) 5-120 MG tablet Take 1 tablet by mouth 2 (two) times daily as needed for allergies. Patient not taking: Reported on 03/24/2017 08/12/16   Hassan RowanWade, Eugene, MD  clonazePAM (KLONOPIN) 0.5 MG tablet Take 0.5 mg by mouth daily.    [provider]  diclofenac (VOLTAREN) 75 MG EC tablet Take 75 mg by mouth 2 (two) times daily.    [provider]  escitalopram (LEXAPRO) 20 MG tablet Take 20 mg by mouth daily.    [provider]  fluconazole (DIFLUCAN) 200 MG tablet  01/15/17   [provider]  fluticasone (FLONASE) 50 MCG/ACT nasal spray Place 2 sprays into both nostrils daily. Patient not taking: Reported on 12/26/2018  08/12/16   Hassan Rowan, MD  liothyronine (CYTOMEL) 5 MCG tablet  01/01/17   [provider]  montelukast (SINGULAIR) 10 MG tablet Take 10 mg by mouth at bedtime.    [provider]  traMADol Janean Sark) 50 MG tablet Take by mouth. 02/18/11   [provider]  triamcinolone (KENALOG) 0.1 % paste Use as directed 1 application in the mouth or throat 2 (two) times daily. Patient not taking: Reported on 12/26/2018 10/09/17   Lutricia Feil, PA-C  zolpidem (AMBIEN) 5 MG tablet Take 5 mg by mouth at bedtime.    [provider]    Allergies: No Known Allergies  Obstetric History: G0P0000  Family History  Problem Relation Age of Onset  . Parkinson's disease Mother   . Hypertension Father     Social History   Socioeconomic History  . Marital status: Married    Spouse name: Not on file  . Number of children: Not on file  . Years of education: Not on file  . Highest education level: Not on file  Occupational History  . Not on file  Social Needs  . Financial resource strain: Not on file  . Food insecurity:    Worry: Not on file    Inability: Not on file  . Transportation needs:    Medical: Not on file    Non-medical: Not on file  Tobacco Use  . Smoking status: Never Smoker  . Smokeless tobacco: Never Used  Substance and Sexual Activity  . Alcohol use: Yes    Alcohol/week: 2.0 standard drinks    Types: 2 Glasses of wine per week  . Drug use: No  . Sexual activity: Yes    Birth control/protection: Pill  Lifestyle  . Physical activity:    Days per week: 3 days    Minutes per session: 30 min  . Stress: Not on file  Relationships  . Social connections:    Talks on phone: Not on file    Gets together: Not on file    Attends religious service: Not on file    Active member of club or organization: Not on file    Attends meetings of clubs or organizations: Not on file    Relationship status: Not on file  . Intimate partner violence:    Fear of current or ex partner: Not on file    Emotionally abused: Not on file    Physically abused: Not on file    Forced sexual activity: Not on file  Other Topics Concern  . Not on file  Social History Narrative  . Not on file    Review of Systems  Constitutional: Negative.   HENT: Positive for congestion (recently, none today). Negative for ear discharge, ear pain, hearing loss, nosebleeds, sinus pain, sore throat and tinnitus.   Eyes: Negative.   Respiratory: Positive for cough (recently, not today). Negative for hemoptysis, sputum production,  shortness of breath, wheezing and stridor.   Cardiovascular: Negative.   Gastrointestinal: Negative.   Genitourinary: Negative.   Musculoskeletal: Negative.   Skin: Negative.   Neurological: Negative.   Psychiatric/Behavioral: Negative.      Physical Exam BP 122/72 (BP Location: Left Arm, Patient Position: Sitting, Cuff Size: Normal)   Pulse (!) 110   Ht 5\' 5"  (1.651 m)   Wt 153 lb (69.4 kg)   LMP 11/29/2018   BMI 25.46 kg/m   Physical Exam Constitutional:      General: She is not in acute distress.  Appearance: She is well-developed.  Genitourinary:     Pelvic exam was performed with patient supine.     Uterus normal.     No inguinal adenopathy present in the right or left side.    No signs of injury in the vagina.     No vaginal discharge, erythema, tenderness or bleeding.     No cervical motion tenderness, discharge, lesion or polyp.     Uterus is mobile.     Uterus is not enlarged or tender.     No uterine mass detected.    Uterus is anteverted.     No right or left adnexal mass present.     Right adnexa not tender or full.     Left adnexa not tender or full.  HENT:     Head: Normocephalic and atraumatic.  Eyes:     General: No scleral icterus. Neck:     Musculoskeletal: Normal range of motion and neck supple.     Thyroid: No thyromegaly.  Cardiovascular:     Rate and Rhythm: Normal rate and regular rhythm.     Heart sounds: No murmur. No friction rub. No gallop.   Pulmonary:     Effort: Pulmonary effort is normal. No respiratory distress.     Breath sounds: Normal breath sounds. No wheezing or rales.  Chest:     Breasts:        Right: No inverted nipple, mass, nipple discharge, skin change or tenderness.        Left: No inverted nipple, mass, nipple discharge, skin change or tenderness.  Abdominal:     General: Bowel sounds are normal. There is no distension.     Palpations: Abdomen is soft. There is no mass.     Tenderness: There is no abdominal  tenderness. There is no guarding or rebound.  Musculoskeletal: Normal range of motion.        General: No tenderness.  Lymphadenopathy:     Cervical: No cervical adenopathy.     Lower Body: No right inguinal adenopathy. No left inguinal adenopathy.  Neurological:     Mental Status: She is alert and oriented to person, place, and time.     Cranial Nerves: No cranial nerve deficit.  Skin:    General: Skin is warm and dry.     Findings: No erythema or rash.  Psychiatric:        Behavior: Behavior normal.        Judgment: Judgment normal.     Female chaperone present for pelvic and breast  portions of the physical exam  Results: AUDIT Questionnaire (screen for alcoholism): 2 PHQ-9: 4  Assessment: 47 y.o. G0P0000 female here for routine gynecologic examination.  Plan: Problem List Items Addressed This Visit    None    Visit Diagnoses    Women's annual routine gynecological examination    -  Primary   Relevant Orders   Cytology - PAP   Screening for depression       Screening for alcoholism       Pap smear for cervical cancer screening       Relevant Orders   Cytology - PAP      Screening: -- Blood pressure screen normal -- Colonoscopy - not due -- Mammogram - due. Patient to call [facility] to arrange. She understands that it is her responsibility to arrange this. -- Weight screening: normal -- Depression screening negative (PHQ-9) -- Nutrition: normal -- cholesterol screening: not due for screening -- osteoporosis screening: not  due -- tobacco screening: not using -- alcohol screening: AUDIT questionnaire indicates low-risk usage. -- family history of breast cancer screening: done. not at high risk. -- no evidence of domestic violence or intimate partner violence. -- STD screening: gonorrhea/chlamydia NAAT not collected per patient request. -- pap smear collected per ASCCP guidelines -- HPV vaccination series: not eligilbe  Thomasene Mohair, MD 12/26/2018 2:50 PM

## 2018-12-30 LAB — CYTOLOGY - PAP
Diagnosis: NEGATIVE
HPV: NOT DETECTED

## 2019-01-02 ENCOUNTER — Encounter: Payer: Self-pay | Admitting: Obstetrics and Gynecology

## 2019-01-27 ENCOUNTER — Other Ambulatory Visit: Payer: Self-pay

## 2019-01-27 DIAGNOSIS — Z01419 Encounter for gynecological examination (general) (routine) without abnormal findings: Secondary | ICD-10-CM

## 2019-01-27 MED ORDER — NORETHINDRONE ACET-ETHINYL EST 1-20 MG-MCG PO TABS: 1.0000 | ORAL_TABLET | Freq: Every day | ORAL | 0 refills | Status: DC

## 2019-01-27 MED ORDER — NORETHINDRONE ACET-ETHINYL EST 1-20 MG-MCG PO TABS: 1.0000 | ORAL_TABLET | Freq: Every day | ORAL | 2 refills | Status: DC

## 2019-01-27 NOTE — Telephone Encounter (Signed)
Pt was seen last month and has notice her refill of bc isn't showing up that it was sent to [facility].  Pt now needs it sent to both [facility] and [facility].  [phone]  Pt aware rx sent to both.

## 2019-02-20 ENCOUNTER — Telehealth: Payer: Self-pay

## 2019-02-20 DIAGNOSIS — Z01419 Encounter for gynecological examination (general) (routine) without abnormal findings: Secondary | ICD-10-CM

## 2019-02-20 MED ORDER — NORETHINDRONE ACET-ETHINYL EST 1-20 MG-MCG PO TABS: 1.0000 | ORAL_TABLET | Freq: Every day | ORAL | 3 refills | Status: DC

## 2019-02-20 NOTE — Telephone Encounter (Signed)
Pt calling regarding her OCP's. Her pharm ([facility])

## 2019-06-29 DIAGNOSIS — Z130 Encounter for screening for diseases of the blood and blood-forming organs and certain disorders involving the immune mechanism: Secondary | ICD-10-CM | POA: Diagnosis not present

## 2019-06-29 DIAGNOSIS — Z79899 Other long term (current) drug therapy: Secondary | ICD-10-CM | POA: Diagnosis not present

## 2019-08-08 DIAGNOSIS — E039 Hypothyroidism, unspecified: Secondary | ICD-10-CM | POA: Diagnosis not present

## 2019-08-15 DIAGNOSIS — E039 Hypothyroidism, unspecified: Secondary | ICD-10-CM | POA: Diagnosis not present

## 2019-08-15 DIAGNOSIS — N289 Disorder of kidney and ureter, unspecified: Secondary | ICD-10-CM | POA: Diagnosis not present

## 2019-08-24 DIAGNOSIS — N289 Disorder of kidney and ureter, unspecified: Secondary | ICD-10-CM | POA: Diagnosis not present

## 2019-08-24 DIAGNOSIS — E039 Hypothyroidism, unspecified: Secondary | ICD-10-CM | POA: Diagnosis not present

## 2019-09-28 DIAGNOSIS — D2371 Other benign neoplasm of skin of right lower limb, including hip: Secondary | ICD-10-CM | POA: Diagnosis not present

## 2019-10-04 DIAGNOSIS — Z23 Encounter for immunization: Secondary | ICD-10-CM | POA: Diagnosis not present

## 2020-03-24 ENCOUNTER — Ambulatory Visit: Payer: BLUE CROSS/BLUE SHIELD

## 2020-03-25 ENCOUNTER — Ambulatory Visit (INDEPENDENT_AMBULATORY_CARE_PROVIDER_SITE_OTHER): Payer: BC Managed Care – PPO | Admitting: Obstetrics and Gynecology

## 2020-03-25 ENCOUNTER — Encounter: Payer: Self-pay | Admitting: Obstetrics and Gynecology

## 2020-03-25 ENCOUNTER — Other Ambulatory Visit: Payer: Self-pay

## 2020-03-25 VITALS — BP 122/74 | Ht 65.0 in | Wt 156.0 lb

## 2020-03-25 DIAGNOSIS — Z1231 Encounter for screening mammogram for malignant neoplasm of breast: Secondary | ICD-10-CM

## 2020-03-25 DIAGNOSIS — Z1331 Encounter for screening for depression: Secondary | ICD-10-CM

## 2020-03-25 DIAGNOSIS — Z1211 Encounter for screening for malignant neoplasm of colon: Secondary | ICD-10-CM

## 2020-03-25 DIAGNOSIS — Z01419 Encounter for gynecological examination (general) (routine) without abnormal findings: Secondary | ICD-10-CM

## 2020-03-25 DIAGNOSIS — Z1339 Encounter for screening examination for other mental health and behavioral disorders: Secondary | ICD-10-CM

## 2020-03-25 NOTE — Progress Notes (Signed)
Gynecology Annual Exam  PCP: Patient, No Pcp Per  Chief Complaint  Patient presents with  . Annual Exam    History of Present Illness:  Ms. Stephanie Mcguire is a 49 y.o. G0P0000 who LMP was Patient's last menstrual period was 03/19/2020., presents today for her annual examination.  Her menses are regular every 28-30 days, lasting 4 day(s).  Dysmenorrhea none. She does not have intermenstrual bleeding.  She does have vasomotor sx. She mostly has hot flushes at night.   She is sexually active infrequently. She does not have vaginal dryness.  Last Pap: 12/26/2018  Results were: no abnormalities /HPV DNA.negative Hx of STDs: none  Last mammogram: 4 years  Results were: normal--routine follow-up in 12 months There is no FH of breast cancer. There is no FH of ovarian cancer. The patient does do self-breast exams.  Colonoscopy: has never had DEXA: has not been screened for osteoporosis  Tobacco use: The patient denies current or previous tobacco use. Alcohol use: social drinker Exercise: not much  The patient wears seatbelts: yes.     Past Medical History:  Diagnosis Date  . Asthma   . Bipolar 2 disorder (HCC)   . Thyroid disease     Past Surgical History:  Procedure Laterality Date  . BUNIONECTOMY    . MANDIBLE SURGERY  2009  . RECONSTRUCTION OF EYELID  1989 and 1990    Prior to Admission medications   Medication Sig Start Date End Date Taking? Authorizing Provider  buPROPion (WELLBUTRIN XL) 150 MG 24 hr tablet Take 150 mg by mouth daily.   Yes [provider]  divalproex (DEPAKOTE ER) 500 MG 24 hr tablet  01/09/17  Yes [provider]  levothyroxine (SYNTHROID, LEVOTHROID) 100 MCG tablet Take 100 mcg by mouth daily before breakfast.   Yes [provider]  norethindrone-ethinyl estradiol (MICROGESTIN,JUNEL,LOESTRIN) 1-20 MG-MCG tablet Take 1 tablet by mouth daily. 02/20/19  Yes Conard Novak, MD   Allergies: No Known Allergies  Obstetric  History: G0P0000  Family History  Problem Relation Age of Onset  . Parkinson's disease Mother   . Hypertension Father     Social History   Socioeconomic History  . Marital status: Married    Spouse name: Not on file  . Number of children: Not on file  . Years of education: Not on file  . Highest education level: Not on file  Occupational History  . Not on file  Tobacco Use  . Smoking status: Never Smoker  . Smokeless tobacco: Never Used  Substance and Sexual Activity  . Alcohol use: Yes    Alcohol/week: 2.0 standard drinks    Types: 2 Glasses of wine per week  . Drug use: No  . Sexual activity: Yes    Birth control/protection: Pill  Other Topics Concern  . Not on file  Social History Narrative  . Not on file   Social Determinants of Health   Financial Resource Strain:   . Difficulty of Paying Living Expenses:   Food Insecurity:   . Worried About Programme researcher, broadcasting/film/video in the Last Year:   . Barista in the Last Year:   Transportation Needs:   . Freight forwarder (Medical):   Marland Kitchen Lack of Transportation (Non-Medical):   Physical Activity:   . Days of Exercise per Week:   . Minutes of Exercise per Session:   Stress:   . Feeling of Stress :   Social Connections:   . Frequency of Communication  with Friends and Family:   . Frequency of Social Gatherings with Friends and Family:   . Attends Religious Services:   . Active Member of Clubs or Organizations:   . Attends Archivist Meetings:   Marland Kitchen Marital Status:   Intimate Partner Violence:   . Fear of Current or Ex-Partner:   . Emotionally Abused:   Marland Kitchen Physically Abused:   . Sexually Abused:     Review of Systems  Constitutional: Positive for malaise/fatigue. Negative for chills, diaphoresis, fever and weight loss.       Hot flashes  HENT: Positive for congestion. Negative for ear discharge, ear pain, hearing loss, nosebleeds, sinus pain, sore throat and tinnitus.   Eyes: Negative.   Respiratory:  Negative.  Negative for stridor.        Some difficulty breathing when lying down.  Is improved with elevating her head.  Cardiovascular: Negative.   Gastrointestinal: Negative.   Genitourinary: Negative.   Musculoskeletal: Negative.   Skin: Negative.   Neurological: Positive for headaches. Negative for dizziness, tingling, tremors, sensory change, speech change, focal weakness, seizures, loss of consciousness and weakness.  Psychiatric/Behavioral: Positive for depression. Negative for hallucinations, memory loss, substance abuse and suicidal ideas. The patient is nervous/anxious. The patient does not have insomnia.      Physical Exam BP 122/74   Ht 5\' 5"  (1.651 m)   Wt 156 lb (70.8 kg)   LMP 03/19/2020   BMI 25.96 kg/m   Physical Exam Constitutional:      General: She is not in acute distress.    Appearance: Normal appearance. She is well-developed.  Genitourinary:     Pelvic exam was performed with patient supine.     Vulva, urethra, bladder and uterus normal.     No inguinal adenopathy present in the right or left side.    No signs of injury in the vagina.     No vaginal discharge, erythema, tenderness or bleeding.     No cervical motion tenderness, discharge, lesion or polyp.     Uterus is mobile.     Uterus is not enlarged or tender.     No uterine mass detected.    Uterus is anteverted.     No right or left adnexal mass present.     Right adnexa not tender or full.     Left adnexa not tender or full.  HENT:     Head: Normocephalic and atraumatic.  Eyes:     General: No scleral icterus.    Conjunctiva/sclera: Conjunctivae normal.  Neck:     Thyroid: No thyromegaly.  Cardiovascular:     Rate and Rhythm: Normal rate and regular rhythm.     Heart sounds: No murmur. No friction rub. No gallop.   Pulmonary:     Effort: Pulmonary effort is normal. No respiratory distress.     Breath sounds: Normal breath sounds. No wheezing or rales.  Chest:     Breasts:         Right: No inverted nipple, mass, nipple discharge, skin change or tenderness.        Left: No inverted nipple, mass, nipple discharge, skin change or tenderness.  Abdominal:     General: Bowel sounds are normal. There is no distension.     Palpations: Abdomen is soft. There is no mass.     Tenderness: There is no abdominal tenderness. There is no guarding or rebound.  Musculoskeletal:        General: No swelling or tenderness.  Normal range of motion.     Cervical back: Normal range of motion and neck supple.  Lymphadenopathy:     Cervical: No cervical adenopathy.     Lower Body: No right inguinal adenopathy. No left inguinal adenopathy.  Neurological:     General: No focal deficit present.     Mental Status: She is alert and oriented to person, place, and time.     Cranial Nerves: No cranial nerve deficit.  Skin:    General: Skin is warm and dry.     Findings: No erythema or rash.  Psychiatric:        Mood and Affect: Mood normal.        Behavior: Behavior normal.        Judgment: Judgment normal.     Female chaperone present for pelvic and breast  portions of the physical exam  Results: AUDIT Questionnaire (screen for alcoholism): 2 PHQ-9: 12  Assessment: 49 y.o. G0P0000 female here for routine gynecologic examination.  Plan: Problem List Items Addressed This Visit    None    Visit Diagnoses    Women's annual routine gynecological examination    -  Primary   Relevant Orders   MM DIGITAL SCREENING BILATERAL   Cologuard   Screening for depression       Screening for alcoholism       Screen for colon cancer       Relevant Orders   Cologuard   Encounter for screening mammogram for malignant neoplasm of breast       Relevant Orders   MM DIGITAL SCREENING BILATERAL      Screening: -- Blood pressure screen normal -- Colonoscopy - due. Cologuard ordered -- Mammogram - due. Patient to call [place] to arrange. She understands that it is her responsibility to arrange  this. -- Weight screening: normal -- Depression screening negative (PHQ-9) -- Nutrition: normal -- cholesterol screening: per PCP -- osteoporosis screening: not due -- tobacco screening: not using -- alcohol screening: AUDIT questionnaire indicates low-risk usage. -- family history of breast cancer screening: done. not at high risk. -- no evidence of domestic violence or intimate partner violence. -- STD screening: gonorrhea/chlamydia NAAT not collected per patient request. -- pap smear not collected per ASCCP guidelines -- flu vaccine received -- HPV vaccination series: not eligilbe  Thomasene Mohair, MD 03/25/2020 3:59 PM

## 2020-03-29 ENCOUNTER — Other Ambulatory Visit: Payer: Self-pay | Admitting: Obstetrics and Gynecology

## 2020-03-29 DIAGNOSIS — Z01419 Encounter for gynecological examination (general) (routine) without abnormal findings: Secondary | ICD-10-CM

## 2020-04-01 ENCOUNTER — Other Ambulatory Visit: Payer: Self-pay

## 2020-04-01 DIAGNOSIS — Z01419 Encounter for gynecological examination (general) (routine) without abnormal findings: Secondary | ICD-10-CM

## 2020-04-01 MED ORDER — NORETHINDRONE ACET-ETHINYL EST 1-20 MG-MCG PO TABS: 1.0000 | ORAL_TABLET | Freq: Every day | ORAL | 3 refills | Status: DC

## 2020-04-18 ENCOUNTER — Emergency Department
Admission: EM | Admit: 2020-04-18 | Discharge: 2020-04-18 | Disposition: A | Discharge status: Home / Self Care | Payer: BC Managed Care – PPO | Attending: Emergency Medicine | Admitting: Emergency Medicine

## 2020-04-18 ENCOUNTER — Emergency Department: Payer: BC Managed Care – PPO

## 2020-04-18 ENCOUNTER — Other Ambulatory Visit: Payer: Self-pay

## 2020-04-18 DIAGNOSIS — R0789 Other chest pain: Secondary | ICD-10-CM | POA: Insufficient documentation

## 2020-04-18 DIAGNOSIS — J45909 Unspecified asthma, uncomplicated: Secondary | ICD-10-CM | POA: Diagnosis not present

## 2020-04-18 DIAGNOSIS — Z79899 Other long term (current) drug therapy: Secondary | ICD-10-CM | POA: Diagnosis not present

## 2020-04-18 DIAGNOSIS — K2970 Gastritis, unspecified, without bleeding: Secondary | ICD-10-CM

## 2020-04-18 DIAGNOSIS — R079 Chest pain, unspecified: Secondary | ICD-10-CM | POA: Diagnosis not present

## 2020-04-18 LAB — CBC
HCT: 43.2 % (ref 36.0–46.0)
Hemoglobin: 14.9 g/dL (ref 12.0–15.0)
MCH: 30.6 pg (ref 26.0–34.0)
MCHC: 34.5 g/dL (ref 30.0–36.0)
MCV: 88.7 fL (ref 80.0–100.0)
Platelets: 222 10*3/uL (ref 150–400)
RBC: 4.87 MIL/uL (ref 3.87–5.11)
RDW: 13 % (ref 11.5–15.5)
WBC: 6.8 10*3/uL (ref 4.0–10.5)
nRBC: 0 % (ref 0.0–0.2)

## 2020-04-18 LAB — BASIC METABOLIC PANEL
Anion gap: 10 (ref 5–15)
BUN: 9 mg/dL (ref 6–20)
CO2: 26 mmol/L (ref 22–32)
Calcium: 9.2 mg/dL (ref 8.9–10.3)
Chloride: 100 mmol/L (ref 98–111)
Creatinine, Ser: 0.87 mg/dL (ref 0.44–1.00)
GFR calc Af Amer: >60 mL/min (ref >60)
GFR calc non Af Amer: >60 mL/min (ref >60)
Glucose, Bld: 123 mg/dL — ABNORMAL HIGH (ref 70–99)
Potassium: 3.8 mmol/L (ref 3.5–5.1)
Sodium: 136 mmol/L (ref 135–145)

## 2020-04-18 LAB — TROPONIN I (HIGH SENSITIVITY)
Troponin I (High Sensitivity): 4 ng/L (ref <18)
Troponin I (High Sensitivity): 3 ng/L (ref <18)

## 2020-04-18 IMAGING — CR DG CHEST 2V
2 series · 2 of 2 positions shown · non-contrast
Comparison: [DATE]

CLINICAL DATA: Pt states mid sternal chest pain with no radiation.
Pt states it is more pressure than pain. Pt states N/V/D but did
have 2nd COVID shot yesterday

EXAM:
CHEST - 2 VIEW

[chest pa]
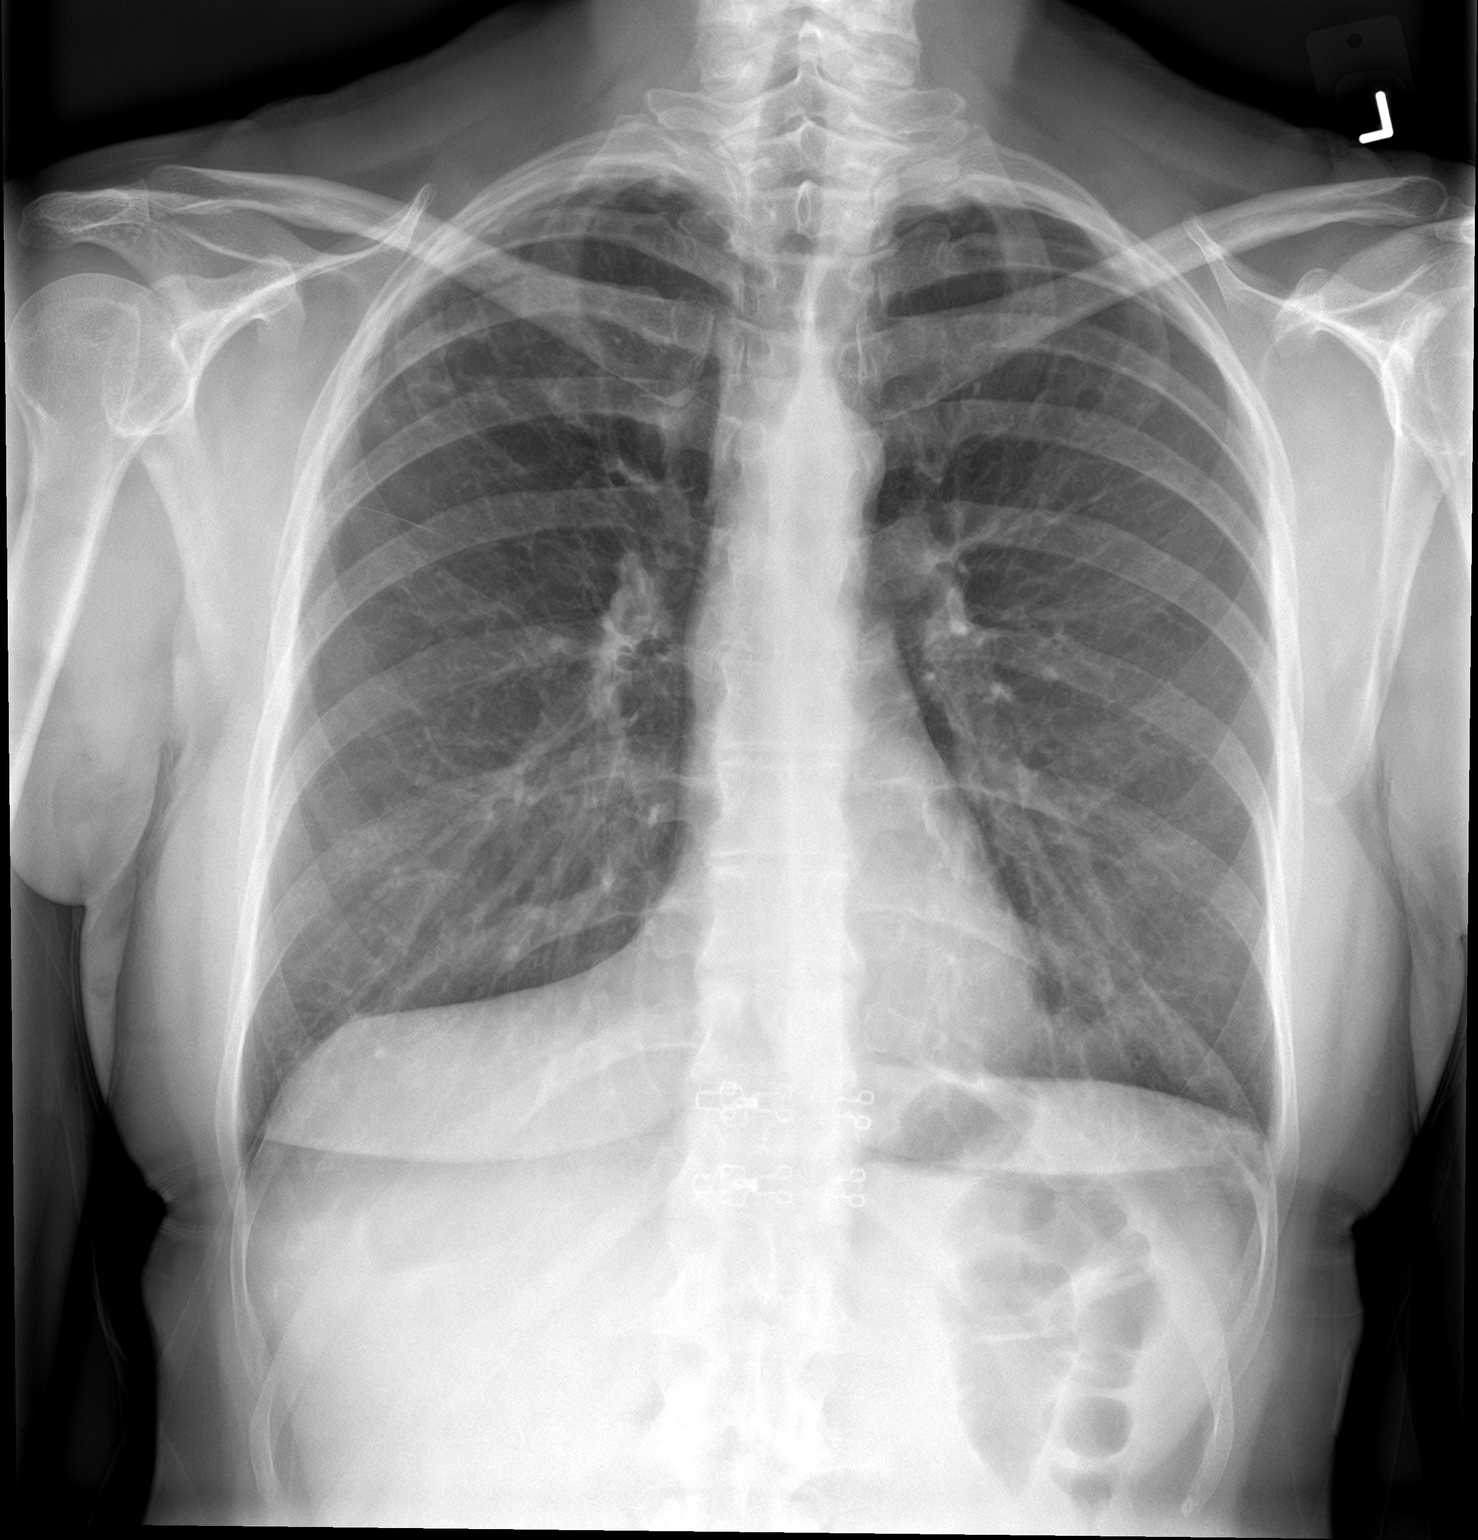

[chest lat]
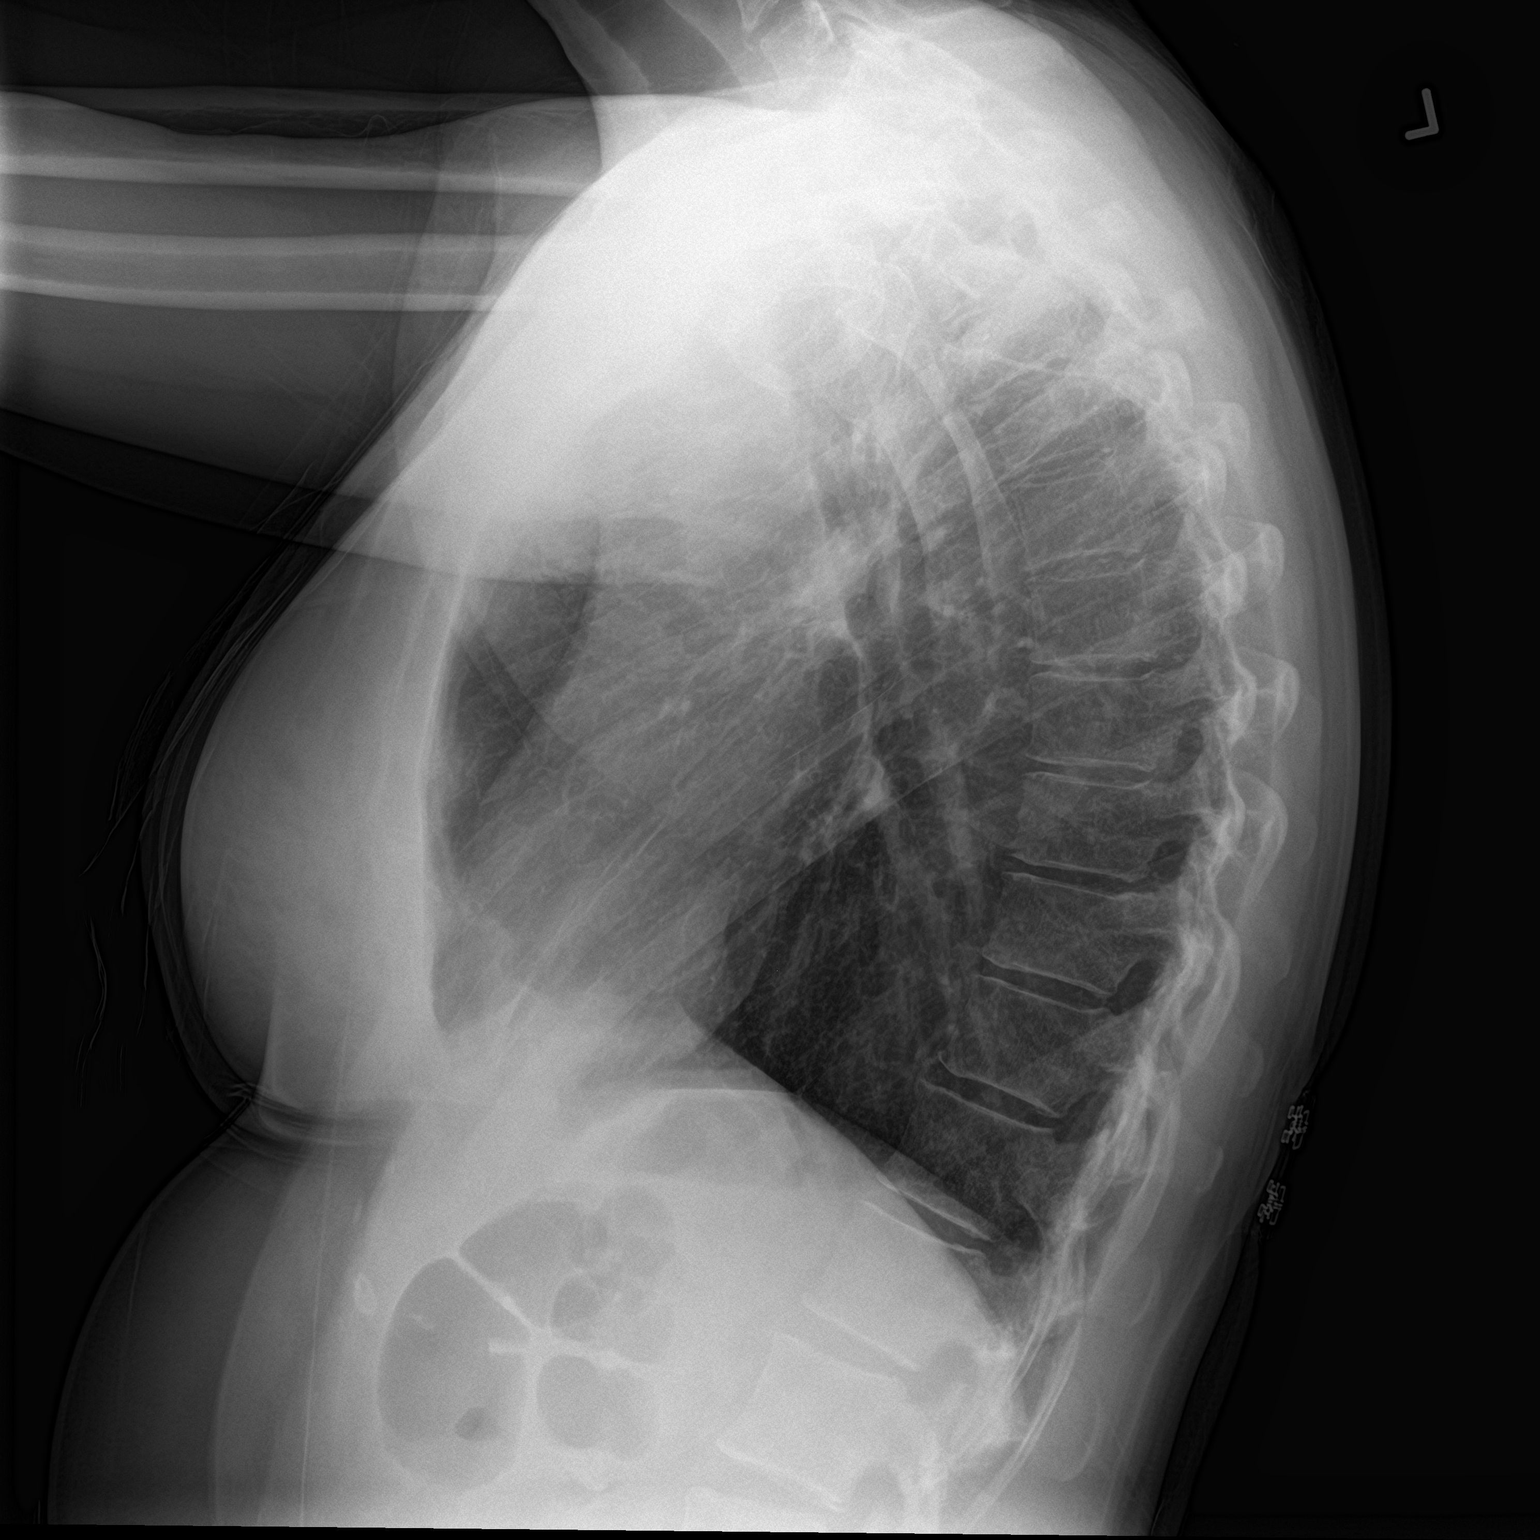

[2 of 2 positions shown; findings below may reference images not displayed]

FINDINGS: Cardiac silhouette is normal in size. No mediastinal or hilar masses
or evidence of adenopathy.

Stable scarring in the upper lobes, right greater than left. Lungs
otherwise clear. Lungs are hyperexpanded.

No pleural effusion or pneumothorax.

Skeletal structures are intact.
IMPRESSION: No acute cardiopulmonary disease.

## 2020-04-18 MED ORDER — FAMOTIDINE 20 MG PO TABS
40.0000 mg | ORAL_TABLET | Freq: Once | ORAL | Status: AC
  Administered 2020-04-18: 40 mg via ORAL
  Filled 2020-04-18: qty 2

## 2020-04-18 MED ORDER — FAMOTIDINE 20 MG PO TABS: 20.0000 mg | ORAL_TABLET | Freq: Two times a day (BID) | ORAL | 0 refills | Status: DC

## 2020-04-18 MED ORDER — SODIUM CHLORIDE 0.9% FLUSH: 3.0000 mL | Freq: Once | INTRAVENOUS | Status: DC

## 2020-04-18 MED ORDER — ALUM & MAG HYDROXIDE-SIMETH 200-200-20 MG/5ML PO SUSP
30.0000 mL | Freq: Once | ORAL | Status: AC
  Administered 2020-04-18: 22:00:00 30 mL via ORAL
  Filled 2020-04-18: qty 30

## 2020-04-18 NOTE — ED Provider Notes (Signed)
[facility] Emergency Department Provider Note  ____________________________________________  Time seen: Approximately 10:50 PM  I have reviewed the triage vital signs and the nursing notes.   HISTORY  Chief Complaint Chest Pain    HPI Stephanie Mcguire is a 49 y.o. female with a history of asthma and GERD who comes the ED complaining of midsternal chest pain that started last night.  Intermittent, episodes lasting 90 to 120 minutes.  Worse lying down, better being upright.  Not exertional, not pleuritic.  No significant shortness of breath diaphoresis or vomiting.  She had her second Akins vaccination yesterday just prior to the onset of the symptoms.  She does have a history of GERD which has felt somewhat similar in the past, but usually resolves after taking a Pepcid.      Past Medical History:  Diagnosis Date  . Asthma   . Bipolar 2 disorder ([place])   . Thyroid disease      Patient Active Problem List   Diagnosis Date Noted  . Interstitial lung disease ([place]) 10/14/2015  . Dyspnea on exertion 10/14/2015  . Congenital hypothyroidism without goiter 10/14/2015  . Abnormal chest CT 10/14/2015  . Essential vulvodynia 12/10/2011     Past Surgical History:  Procedure Laterality Date  . BUNIONECTOMY    . MANDIBLE SURGERY  2009  . [place]     Prior to Admission medications   Medication Sig Start Date End Date Taking? Authorizing Provider  buPROPion (WELLBUTRIN XL) 150 MG 24 hr tablet Take 150 mg by mouth daily.    [provider]  cefUROXime (CEFTIN) 500 MG tablet Take 1 tablet (500 mg total) by mouth 2 (two) times daily. Patient not taking: Reported on 03/24/2017 08/12/16   Frederich Cha, MD  cetirizine-pseudoephedrine (ZYRTEC-D) 5-120 MG tablet Take 1 tablet by mouth 2 (two) times daily as needed for allergies. Patient not taking: Reported on 03/24/2017 08/12/16   Frederich Cha, MD  clonazePAM  (KLONOPIN) 0.5 MG tablet Take 0.5 mg by mouth daily.    [provider]  diclofenac (VOLTAREN) 75 MG EC tablet Take 75 mg by mouth 2 (two) times daily.    [provider]  divalproex (DEPAKOTE ER) 500 MG 24 hr tablet  01/09/17   [provider]  escitalopram (LEXAPRO) 20 MG tablet Take 20 mg by mouth daily.    [provider]  fluconazole (DIFLUCAN) 200 MG tablet  01/15/17   [provider]  fluticasone (FLONASE) 50 MCG/ACT nasal spray Place 2 sprays into both nostrils daily. Patient not taking: Reported on 12/26/2018 08/12/16   Frederich Cha, MD  levothyroxine (SYNTHROID, LEVOTHROID) 100 MCG tablet Take 100 mcg by mouth daily before breakfast.    [provider]  liothyronine (CYTOMEL) 5 MCG tablet  01/01/17   [provider]  montelukast (SINGULAIR) 10 MG tablet Take 10 mg by mouth at bedtime.    [provider]  norethindrone-ethinyl estradiol (LOESTRIN) 1-20 MG-MCG tablet TAKE 1 TABLET DAILY 04/02/20   Will Bonnet, MD  traMADol Veatrice Bourbon) 50 MG tablet Take by mouth. 02/18/11   [provider]  triamcinolone (KENALOG) 0.1 % paste Use as directed 1 application in the mouth or throat 2 (two) times daily. Patient not taking: Reported on 12/26/2018 10/09/17   Lorin Picket, PA-C  zolpidem (AMBIEN) 5 MG tablet Take 5 mg by mouth at bedtime.    [provider]     Allergies Patient has no  known allergies.   Family History  Problem Relation Age of Onset  . Parkinson's disease Mother   . Hypertension Father     Social History Social History   Tobacco Use  . Smoking status: Never Smoker  . Smokeless tobacco: Never Used  Substance Use Topics  . Alcohol use: Yes    Alcohol/week: 2.0 standard drinks    Types: 2 Glasses of wine per week  . Drug use: No    Review of Systems  Constitutional:   No fever or chills.  ENT:   No sore throat. No rhinorrhea. Cardiovascular:   Positive chest pain as  above without syncope. Respiratory:   No dyspnea or cough. Gastrointestinal:   Negative for abdominal pain, vomiting and diarrhea.  Musculoskeletal:   Negative for focal pain or swelling All other systems reviewed and are negative except as documented above in ROS and HPI.  ____________________________________________   PHYSICAL EXAM:  VITAL SIGNS: ED Triage Vitals  Enc Vitals Group     BP 04/18/20 1822 140/88     Pulse Rate 04/18/20 1822 (!) 120     Resp 04/18/20 1822 18     Temp 04/18/20 1822 99.2 F (37.3 C)     Temp src --      SpO2 04/18/20 1822 99 %     Weight 04/18/20 1819 155 lb (70.3 kg)     Height 04/18/20 1819 5\' 5"  (1.651 m)     Head Circumference --      Peak Flow --      Pain Score 04/18/20 1819 5     Pain Loc --      Pain Edu? --      Excl. in GC? --     Vital signs reviewed, nursing assessments reviewed.   Constitutional:   Alert and oriented. Non-toxic appearance. Eyes:   Conjunctivae are normal. EOMI. PERRL. ENT      Head:   Normocephalic and atraumatic.      Nose:   Wearing a mask.      Mouth/Throat:   Wearing a mask.      Neck:   No meningismus. Full ROM.  Thyroid nonpalpable Hematological/Lymphatic/Immunilogical:   No cervical lymphadenopathy. Cardiovascular:   RRR. Symmetric bilateral radial and DP pulses.  No murmurs. Cap refill less than 2 seconds. Respiratory:   Normal respiratory effort without tachypnea/retractions. Breath sounds are clear and equal bilaterally. No wheezes/rales/rhonchi. Gastrointestinal:   Soft and nontender. Non distended. There is no CVA tenderness.  No rebound, rigidity, or guarding. Musculoskeletal:   Normal range of motion in all extremities. No joint effusions.  No lower extremity tenderness.  No edema. Neurologic:   Normal speech and language.  Motor grossly intact. No acute focal neurologic deficits are appreciated.  Skin:    Skin is warm, dry and intact. No rash noted.  No petechiae, purpura, or  bullae.  ____________________________________________    LABS (pertinent positives/negatives) (all labs ordered are listed, but only abnormal results are displayed) Labs Reviewed  BASIC METABOLIC PANEL - Abnormal; Notable for the following components:      Result Value   Glucose, Bld 123 (*)    All other components within normal limits  CBC  POC URINE PREG, ED  TROPONIN I (HIGH SENSITIVITY)  TROPONIN I (HIGH SENSITIVITY)   ____________________________________________   EKG  Interpreted by me Sinus tachycardia rate 125, normal axis and intervals.  Normal QRS ST segments and T waves.  1 PVC on the strip.  ____________________________________________  RADIOLOGY  DG Chest 2 View  Result Date: 04/18/2020 CLINICAL DATA:  Pt states mid sternal chest pain with no radiation. Pt states it is more pressure than pain. Pt states N/V/D but did have 2nd COVID shot yesterday EXAM: CHEST - 2 VIEW COMPARISON:  03/16/2013 FINDINGS: Cardiac silhouette is normal in size. No mediastinal or hilar masses or evidence of adenopathy. Stable scarring in the upper lobes, right greater than left. Lungs otherwise clear. Lungs are hyperexpanded. No pleural effusion or pneumothorax. Skeletal structures are intact. IMPRESSION: No acute cardiopulmonary disease. Electronically Signed   By: Amie Portland M.D.   On: 04/18/2020 19:11    ____________________________________________   PROCEDURES Procedures  ____________________________________________    CLINICAL IMPRESSION / ASSESSMENT AND PLAN / ED COURSE  Medications ordered in the ED: Medications  sodium chloride flush (NS) 0.9 % injection 3 mL (3 mLs Intravenous Refused 04/18/20 2152)  alum & mag hydroxide-simeth (MAALOX/MYLANTA) 200-200-20 MG/5ML suspension 30 mL (30 mLs Oral Given 04/18/20 2136)  famotidine (PEPCID) tablet 40 mg (40 mg Oral Given 04/18/20 2135)    Pertinent labs & imaging results that were available during my care of the patient  were reviewed by me and considered in my medical decision making (see chart for details).  AIDA LEMAIRE was evaluated in Emergency Department on 04/18/2020 for the symptoms described in the history of present illness. She was evaluated in the context of the global COVID-19 pandemic, which necessitated consideration that the patient might be at risk for infection with the SARS-CoV-2 virus that causes COVID-19. Institutional protocols and algorithms that pertain to the evaluation of patients at risk for COVID-19 are in a state of rapid change based on information released by regulatory bodies including the CDC and federal and state organizations. These policies and algorithms were followed during the patient's care in the ED.   Patient presents with atypical chest pain, likely to be GERD/gastritis provoked by inflammatory state of Covid vaccination, from which she is experiencing multiple constitutional side effects.  EKG, chest x-ray, labs including serial troponins all unremarkable.  Patient given Maalox and Pepcid in the ED and feeling much better.  Stable for discharge home.  Counseled on bland diet, hydration, rest until feeling back to normal.  She will take Pepcid twice daily for the next 3 to 4 days as well.      ____________________________________________   FINAL CLINICAL IMPRESSION(S) / ED DIAGNOSES    Final diagnoses:  Atypical chest pain  Gastritis without bleeding, unspecified chronicity, unspecified gastritis type     ED Discharge Orders    None      Portions of this note were generated with dragon dictation software. Dictation errors may occur despite best attempts at proofreading.   Sharman Cheek, MD 04/18/20 [phone]

## 2020-04-18 NOTE — ED Notes (Signed)
E-signature not working at this time. Pt verbalized understanding of D/C instructions, prescriptions and follow up care with no further questions at this time. Pt in NAD and ambulatory at time of D/C.  

## 2020-04-18 NOTE — ED Triage Notes (Addendum)
Pt comes via POV from home with c/o chest pain. Pt states this started last night with a 2 hour long episode and then again today for about 90 minutes.  Pt states mid sternal chest pain with no radiation. Pt states it is more pressure than pain. Pt states N/V/D but did have 2nd COVID shot yesterday.

## 2020-04-29 DIAGNOSIS — F4322 Adjustment disorder with anxiety: Secondary | ICD-10-CM | POA: Diagnosis not present

## 2020-04-29 DIAGNOSIS — F3181 Bipolar II disorder: Secondary | ICD-10-CM | POA: Diagnosis not present

## 2020-05-03 DIAGNOSIS — Z79899 Other long term (current) drug therapy: Secondary | ICD-10-CM | POA: Diagnosis not present

## 2020-05-03 DIAGNOSIS — Z130 Encounter for screening for diseases of the blood and blood-forming organs and certain disorders involving the immune mechanism: Secondary | ICD-10-CM | POA: Diagnosis not present

## 2020-08-02 ENCOUNTER — Other Ambulatory Visit: Payer: Self-pay

## 2020-08-02 ENCOUNTER — Ambulatory Visit: Payer: Self-pay

## 2020-08-02 ENCOUNTER — Encounter: Payer: Self-pay | Admitting: Family Medicine

## 2020-08-02 ENCOUNTER — Ambulatory Visit (INDEPENDENT_AMBULATORY_CARE_PROVIDER_SITE_OTHER): Payer: BC Managed Care – PPO

## 2020-08-02 ENCOUNTER — Ambulatory Visit (INDEPENDENT_AMBULATORY_CARE_PROVIDER_SITE_OTHER): Payer: BC Managed Care – PPO | Admitting: Family Medicine

## 2020-08-02 VITALS — BP 122/82 | HR 79 | Ht 65.0 in | Wt 150.0 lb

## 2020-08-02 DIAGNOSIS — Z0389 Encounter for observation for other suspected diseases and conditions ruled out: Secondary | ICD-10-CM | POA: Diagnosis not present

## 2020-08-02 DIAGNOSIS — M25561 Pain in right knee: Secondary | ICD-10-CM

## 2020-08-02 DIAGNOSIS — M25562 Pain in left knee: Secondary | ICD-10-CM | POA: Diagnosis not present

## 2020-08-02 DIAGNOSIS — S83241A Other tear of medial meniscus, current injury, right knee, initial encounter: Secondary | ICD-10-CM

## 2020-08-02 IMAGING — DX DG KNEE 3 VIEWS*R*
3 series · 3 of 3 positions shown · non-contrast
Comparison: [DATE]

CLINICAL DATA: Possible torn meniscus

EXAM:
RIGHT KNEE - 3 VIEW

[knee ap]
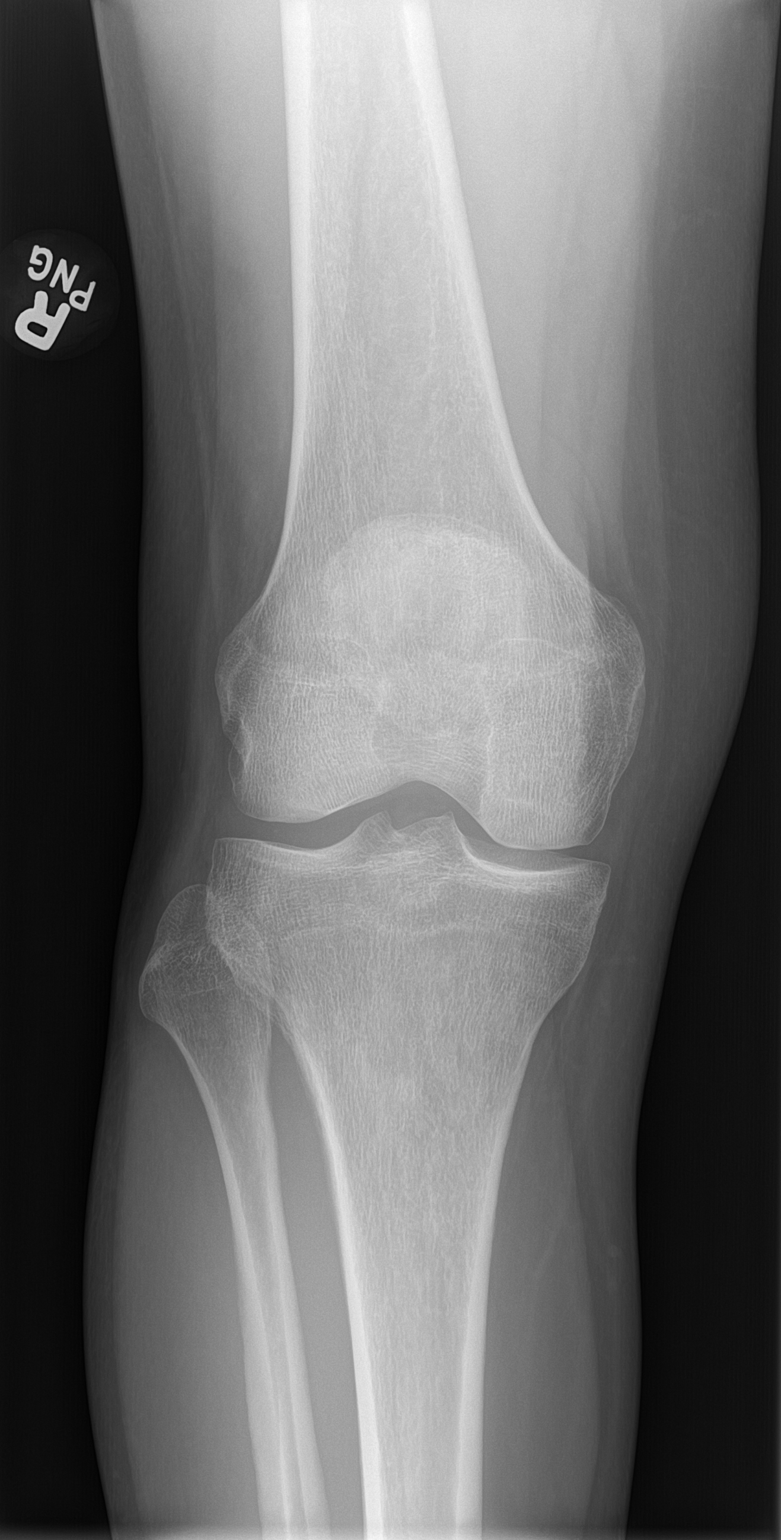

[knee lat]
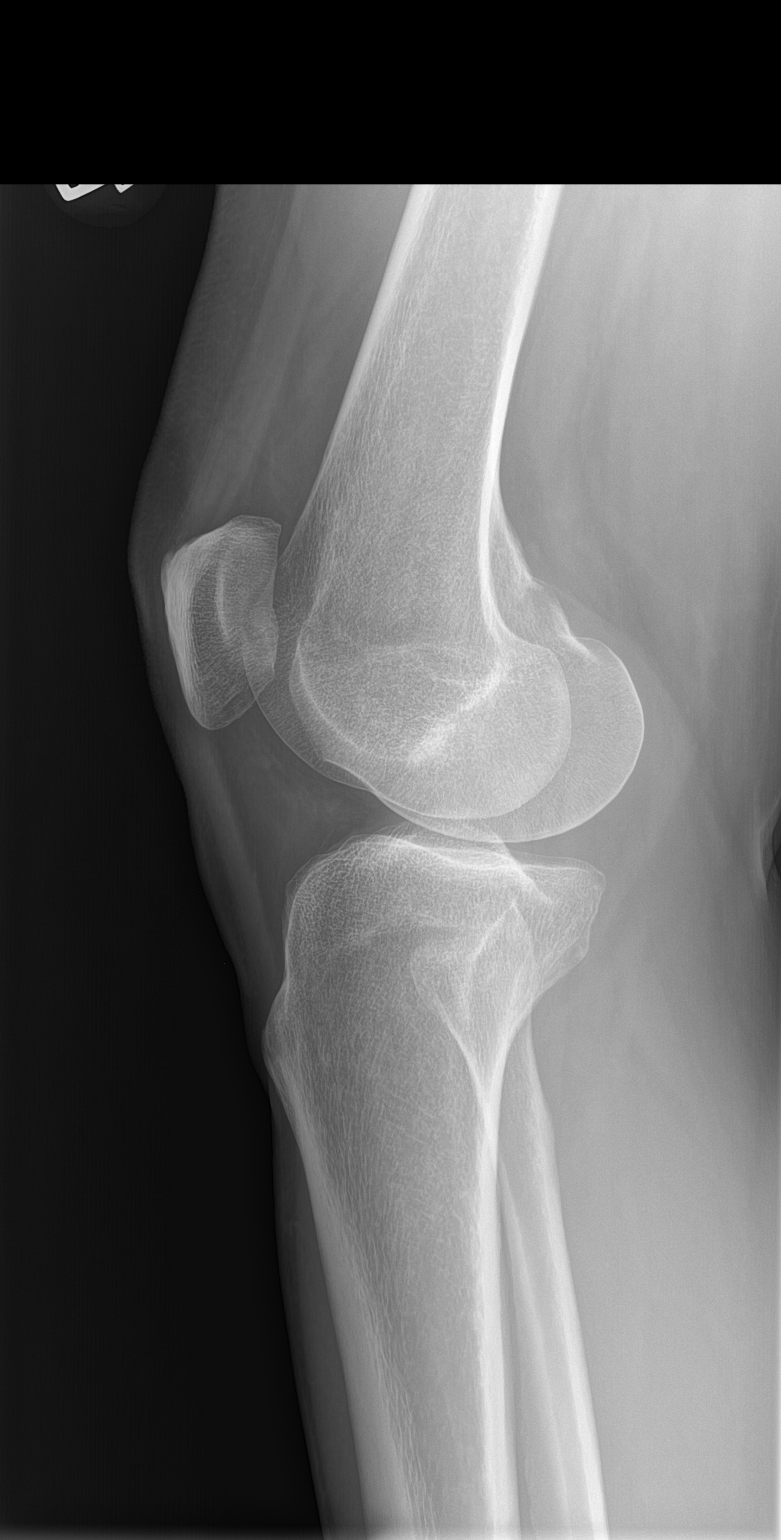

[patella]
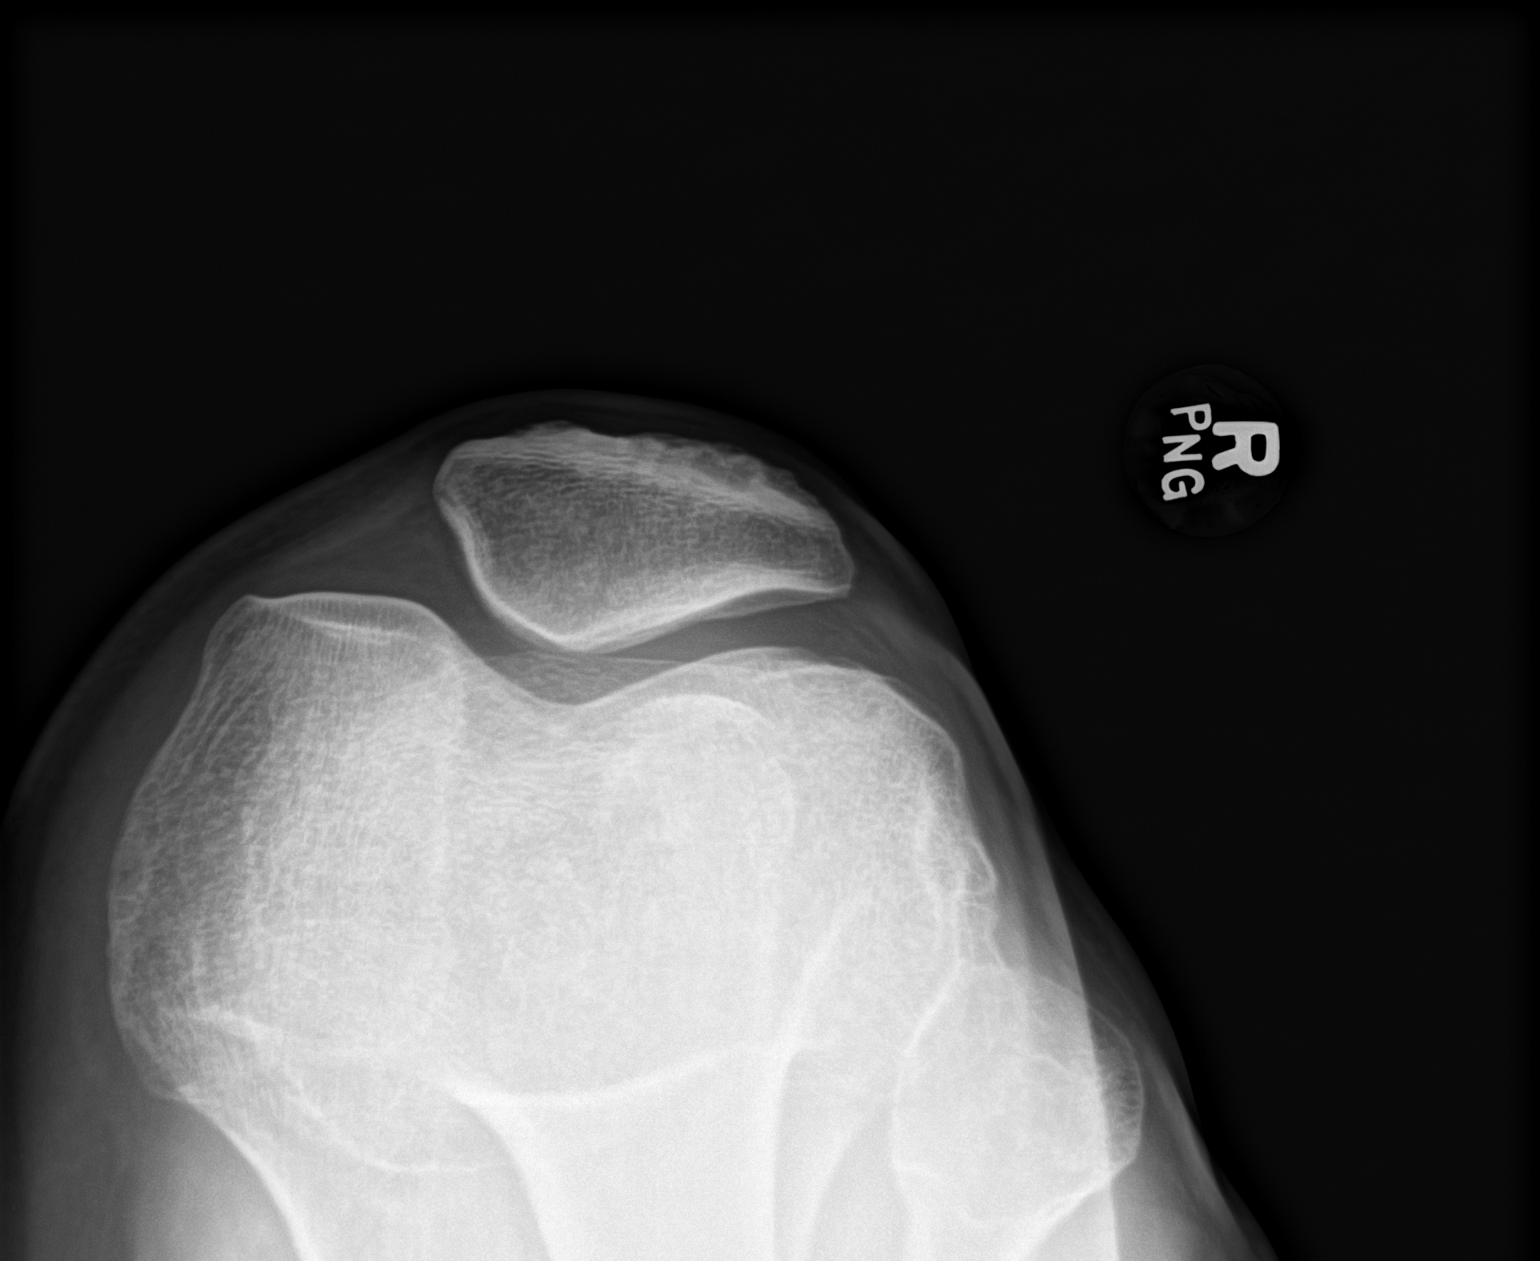

[3 of 3 positions shown; findings below may reference images not displayed]

FINDINGS: No acute fracture or dislocation. Joint spaces and alignment are
maintained. Tiny enthesiophyte at the quadriceps tendon insertion on
the patella. No area of erosion or osseous destruction. No
unexpected radiopaque foreign body. Soft tissues are unremarkable.
IMPRESSION: No acute fracture or dislocation.

## 2020-08-02 NOTE — Progress Notes (Signed)
[name] [address] [place] [address] Phone: [phone] Subjective:   Bruce Donath, am serving as a scribe for Dr. Antoine Primas. This visit occurred during the SARS-CoV-2 public health emergency.  Safety protocols were in place, including screening questions prior to the visit, additional usage of staff PPE, and extensive cleaning of exam room while observing appropriate contact time as indicated for disinfecting solutions.  I'm seeing this patient by the request  of:  Patient, No Pcp Per  CC: Knee pain  [id number]  Stephanie Mcguire is a 49 y.o. female coming in with complaint of right knee pain. Patient was running in the rain trying to get into her car. Hit knee on side of dashboard. Thirty minute later she was going down stairs to get cat food and she felt a pop. Pain is behind patella. Icing for pain.    Bilateral standing xray 2017 IMPRESSION: Symmetric narrowing medially.  No fracture or dislocation.      Past Medical History:  Diagnosis Date  . Asthma   . Bipolar 2 disorder (HCC)   . Thyroid disease    Past Surgical History:  Procedure Laterality Date  . BUNIONECTOMY    . MANDIBLE SURGERY  2009  . RECONSTRUCTION OF EYELID  1989 and 1990   Social History   Socioeconomic History  . Marital status: Married    Spouse name: Not on file  . Number of children: Not on file  . Years of education: Not on file  . Highest education level: Not on file  Occupational History  . Not on file  Tobacco Use  . Smoking status: Never Smoker  . Smokeless tobacco: Never Used  Vaping Use  . Vaping Use: Never used  Substance and Sexual Activity  . Alcohol use: Yes    Alcohol/week: 2.0 standard drinks    Types: 2 Glasses of wine per week  . Drug use: No  . Sexual activity: Yes    Birth control/protection: Pill  Other Topics Concern  . Not on file  Social History Narrative  . Not on file   Social Determinants of Health    Financial Resource Strain:   . Difficulty of Paying Living Expenses:   Food Insecurity:   . Worried About Programme researcher, broadcasting/film/video in the Last Year:   . Barista in the Last Year:   Transportation Needs:   . Freight forwarder (Medical):   Marland Kitchen Lack of Transportation (Non-Medical):   Physical Activity:   . Days of Exercise per Week:   . Minutes of Exercise per Session:   Stress:   . Feeling of Stress :   Social Connections:   . Frequency of Communication with Friends and Family:   . Frequency of Social Gatherings with Friends and Family:   . Attends Religious Services:   . Active Member of Clubs or Organizations:   . Attends Banker Meetings:   Marland Kitchen Marital Status:    No Known Allergies Family History  Problem Relation Age of Onset  . Parkinson's disease Mother   . Hypertension Father     Current Outpatient Medications (Endocrine & Metabolic):  .  levothyroxine (SYNTHROID, LEVOTHROID) 100 MCG tablet, Take 100 mcg by mouth daily before breakfast. .  liothyronine (CYTOMEL) 5 MCG tablet,  .  norethindrone-ethinyl estradiol (LOESTRIN) 1-20 MG-MCG tablet, TAKE 1 TABLET DAILY   Current Outpatient Medications (Respiratory):  .  cetirizine-pseudoephedrine (ZYRTEC-D) 5-120 MG tablet, Take 1 tablet  by mouth 2 (two) times daily as needed for allergies. .  fluticasone (FLONASE) 50 MCG/ACT nasal spray, Place 2 sprays into both nostrils daily. .  montelukast (SINGULAIR) 10 MG tablet, Take 10 mg by mouth at bedtime.  Current Outpatient Medications (Analgesics):  .  diclofenac (VOLTAREN) 75 MG EC tablet, Take 75 mg by mouth 2 (two) times daily. .  traMADol (ULTRAM) 50 MG tablet, Take by mouth.   Current Outpatient Medications (Other):  Marland Kitchen  buPROPion (WELLBUTRIN XL) 150 MG 24 hr tablet, Take 150 mg by mouth daily. .  cefUROXime (CEFTIN) 500 MG tablet, Take 1 tablet (500 mg total) by mouth 2 (two) times daily. .  clonazePAM (KLONOPIN) 0.5 MG tablet, Take 0.5 mg by mouth  daily. .  divalproex (DEPAKOTE ER) 500 MG 24 hr tablet,  .  escitalopram (LEXAPRO) 20 MG tablet, Take 20 mg by mouth daily. .  famotidine (PEPCID) 20 MG tablet, Take 1 tablet (20 mg total) by mouth 2 (two) times daily. .  fluconazole (DIFLUCAN) 200 MG tablet,  .  triamcinolone (KENALOG) 0.1 % paste, Use as directed 1 application in the mouth or throat 2 (two) times daily. Marland Kitchen  zolpidem (AMBIEN) 5 MG tablet, Take 5 mg by mouth at bedtime.   Reviewed prior external information including notes and imaging from  primary care provider As well as notes that were available from care everywhere and other healthcare systems.  Past medical history, social, surgical and family history all reviewed in electronic medical record.  No pertanent information unless stated regarding to the chief complaint.   Review of Systems:  No headache, visual changes, nausea, vomiting, diarrhea, constipation, dizziness, abdominal pain, skin rash, fevers, chills, night sweats, weight loss, swollen lymph nodes, body aches, joint swelling, chest pain, shortness of breath, mood changes. POSITIVE muscle aches  Objective  Blood pressure 122/82, pulse 79, height 5\' 5"  (1.651 m), weight 150 lb (68 kg), last menstrual period 07/12/2020, SpO2 98 %.   General: No apparent distress alert and oriented x3 mood and affect normal, dressed appropriately.  HEENT: Pupils equal, extraocular movements intact  Respiratory: Patient's speak in full sentences and does not appear short of breath  Cardiovascular: No lower extremity edema, non tender, no erythema  Neuro: Cranial nerves II through XII are intact, neurovascularly intact in all extremities with 2+ DTRs and 2+ pulses.  Gait normal with good balance and coordination.  MSK:  Right knee exam shows the patient does not have any significant swelling.  Mild positive crepitus with patellar grind.  Patient is tender to palpation over the medial joint space.  Full range of motion no noted.   Mild positive McMurray's.  Limited musculoskeletal ultrasound was performed and interpreted by 07/14/2020  Limited ultrasound of patient's right knee shows that patient does have a medial meniscal tear noted with about a 25% displacement of the posterior medial aspect.  The lateral appears to be unremarkable and very minimal arthritic changes of the patellofemoral joint    Impression and Recommendations:     The above documentation has been reviewed and is accurate and complete Stephanie Saa, DO       Note: This dictation was prepared with Dragon dictation along with smaller phrase technology. Any transcriptional errors that result from this process are unintentional.

## 2020-08-02 NOTE — Patient Instructions (Signed)
Medial meniscus [place]aid Ice Exercises No twisting or low squatting See me in 6 weeks

## 2020-08-02 NOTE — Assessment & Plan Note (Signed)
Patient does have a meniscal tear.  Home exercises given, can continue the KT taping.  Has had known arthritic changes but will get x-rays of the right knee to see if there has been any significant progression from patient's previous x-rays of 4 years ago.  Discussed icing regimen and home exercises, increase activity slowly.  Follow-up again in 4 to 8 weeks

## 2020-08-12 DIAGNOSIS — E039 Hypothyroidism, unspecified: Secondary | ICD-10-CM | POA: Diagnosis not present

## 2020-08-19 DIAGNOSIS — E039 Hypothyroidism, unspecified: Secondary | ICD-10-CM | POA: Diagnosis not present

## 2020-08-20 ENCOUNTER — Ambulatory Visit: Payer: BC Managed Care – PPO | Admitting: Family Medicine

## 2020-09-05 ENCOUNTER — Ambulatory Visit (INDEPENDENT_AMBULATORY_CARE_PROVIDER_SITE_OTHER): Payer: BC Managed Care – PPO | Admitting: Family Medicine

## 2020-09-05 ENCOUNTER — Other Ambulatory Visit: Payer: Self-pay

## 2020-09-05 ENCOUNTER — Encounter: Payer: Self-pay | Admitting: Family Medicine

## 2020-09-05 VITALS — BP 130/80 | HR 95 | Ht 65.0 in | Wt 157.0 lb

## 2020-09-05 DIAGNOSIS — S83241A Other tear of medial meniscus, current injury, right knee, initial encounter: Secondary | ICD-10-CM | POA: Diagnosis not present

## 2020-09-05 NOTE — Progress Notes (Signed)
[name] [address] [place] [address] Phone: [phone] Subjective:   I Stephanie Mcguire am serving as a Neurosurgeon for Dr. Antoine Primas.  This visit occurred during the SARS-CoV-2 public health emergency.  Safety protocols were in place, including screening questions prior to the visit, additional usage of staff PPE, and extensive cleaning of exam room while observing appropriate contact time as indicated for disinfecting solutions.   I'm seeing this patient by the request  of:  Patient, No Pcp Per  CC:   [id number]   08/02/2020 Patient does have a meniscal tear.  Home exercises given, can continue the KT taping.  Has had known arthritic changes but will get x-rays of the right knee to see if there has been any significant progression from patient's previous x-rays of 4 years ago.  Discussed icing regimen and home exercises, increase activity slowly.  Follow-up again in 4 to 8 weeks  Update 09/05/2020 Stephanie Mcguire is a 49 y.o. female coming in with complaint of right knee, medial meniscus tear. Patient states she is in pain today and is limping. States the knee pops more.        Past Medical History:  Diagnosis Date  . Asthma   . Bipolar 2 disorder (HCC)   . Thyroid disease    Past Surgical History:  Procedure Laterality Date  . BUNIONECTOMY    . MANDIBLE SURGERY  2009  . RECONSTRUCTION OF EYELID  1989 and 1990   Social History   Socioeconomic History  . Marital status: Married    Spouse name: Not on file  . Number of children: Not on file  . Years of education: Not on file  . Highest education level: Not on file  Occupational History  . Not on file  Tobacco Use  . Smoking status: Never Smoker  . Smokeless tobacco: Never Used  Vaping Use  . Vaping Use: Never used  Substance and Sexual Activity  . Alcohol use: Yes    Alcohol/week: 2.0 standard drinks    Types: 2 Glasses of wine per week  . Drug use: No  . Sexual  activity: Yes    Birth control/protection: Pill  Other Topics Concern  . Not on file  Social History Narrative  . Not on file   Social Determinants of Health   Financial Resource Strain:   . Difficulty of Paying Living Expenses: Not on file  Food Insecurity:   . Worried About Programme researcher, broadcasting/film/video in the Last Year: Not on file  . Ran Out of Food in the Last Year: Not on file  Transportation Needs:   . Lack of Transportation (Medical): Not on file  . Lack of Transportation (Non-Medical): Not on file  Physical Activity:   . Days of Exercise per Week: Not on file  . Minutes of Exercise per Session: Not on file  Stress:   . Feeling of Stress : Not on file  Social Connections:   . Frequency of Communication with Friends and Family: Not on file  . Frequency of Social Gatherings with Friends and Family: Not on file  . Attends Religious Services: Not on file  . Active Member of Clubs or Organizations: Not on file  . Attends Banker Meetings: Not on file  . Marital Status: Not on file   No Known Allergies Family History  Problem Relation Age of Onset  . Parkinson's disease Mother   . Hypertension Father     Current  Outpatient Medications (Endocrine & Metabolic):  .  levothyroxine (SYNTHROID, LEVOTHROID) 100 MCG tablet, Take 100 mcg by mouth daily before breakfast. .  norethindrone-ethinyl estradiol (LOESTRIN) 1-20 MG-MCG tablet, TAKE 1 TABLET DAILY   Current Outpatient Medications (Respiratory):  .  cetirizine-pseudoephedrine (ZYRTEC-D) 5-120 MG tablet, Take 1 tablet by mouth 2 (two) times daily as needed for allergies. .  fluticasone (FLONASE) 50 MCG/ACT nasal spray, Place 2 sprays into both nostrils daily.    Current Outpatient Medications (Other):  Marland Kitchen  buPROPion (WELLBUTRIN XL) 150 MG 24 hr tablet, Take 150 mg by mouth daily. .  clonazePAM (KLONOPIN) 0.5 MG tablet, Take 0.5 mg by mouth daily. .  divalproex (DEPAKOTE ER) 500 MG 24 hr tablet,    Reviewed prior  external information including notes and imaging from  primary care provider As well as notes that were available from care everywhere and other healthcare systems.  Past medical history, social, surgical and family history all reviewed in electronic medical record.  No pertanent information unless stated regarding to the chief complaint.   Review of Systems:  No headache, visual changes, nausea, vomiting, diarrhea, constipation, dizziness, abdominal pain, skin rash, fevers, chills, night sweats, weight loss, swollen lymph nodes, body aches,chest pain, shortness of breath, mood changes. POSITIVE muscle aches, joint swelling  Objective  Blood pressure 130/80, pulse 95, height 5\' 5"  (1.651 m), weight 157 lb (71.2 kg), SpO2 98 %.   General: No apparent distress alert and oriented x3 mood and affect normal, dressed appropriately.  HEENT: Pupils equal, extraocular movements intact  Respiratory: Patient's speak in full sentences and does not appear short of breath  Cardiovascular: No lower extremity edema, non tender, no erythema  Neuro: Cranial nerves II through XII are intact, neurovascularly intact in all extremities with 2+ DTRs and 2+ pulses.  Gait mild antalgic Right knee exam seems to not fully extend lacking the last 5 degrees.  Tender to palpation over the medial joint line.  Severely positive McMurray's noted.  Lacks the last 5 degrees of flexion as well.  Very mild trace effusion compared to the contralateral side    Impression and Recommendations:     The above documentation has been reviewed and is accurate and complete , DO       Note: This dictation was prepared with Dragon dictation along with smaller phrase technology. Any transcriptional errors that result from this process are unintentional.

## 2020-09-05 NOTE — Patient Instructions (Signed)
[facility] °[phone] °

## 2020-09-05 NOTE — Assessment & Plan Note (Signed)
Patient is having worsening symptoms with locking at this point.  Before we saw some calcific things and there could be a possibility for a loose body in the knee.  With patient having instability of the knee and the locking mechanism I do feel advanced imaging is warranted at this time.  Depending on findings we will see if patient needs surgical intervention but I am hoping that that will not be likely.  Follow-up again after imaging and will discuss.

## 2020-09-08 ENCOUNTER — Encounter: Payer: Self-pay | Admitting: Family Medicine

## 2020-09-11 ENCOUNTER — Ambulatory Visit (INDEPENDENT_AMBULATORY_CARE_PROVIDER_SITE_OTHER): Payer: BC Managed Care – PPO

## 2020-09-11 ENCOUNTER — Ambulatory Visit (INDEPENDENT_AMBULATORY_CARE_PROVIDER_SITE_OTHER): Payer: BC Managed Care – PPO | Admitting: Family Medicine

## 2020-09-11 ENCOUNTER — Encounter: Payer: Self-pay | Admitting: Family Medicine

## 2020-09-11 ENCOUNTER — Other Ambulatory Visit: Payer: Self-pay

## 2020-09-11 VITALS — BP 124/86 | HR 93 | Ht 65.0 in | Wt 152.0 lb

## 2020-09-11 DIAGNOSIS — G5701 Lesion of sciatic nerve, right lower limb: Secondary | ICD-10-CM

## 2020-09-11 DIAGNOSIS — S39012A Strain of muscle, fascia and tendon of lower back, initial encounter: Secondary | ICD-10-CM | POA: Diagnosis not present

## 2020-09-11 DIAGNOSIS — M545 Low back pain: Secondary | ICD-10-CM | POA: Diagnosis not present

## 2020-09-11 IMAGING — DX DG LUMBAR SPINE 2-3V
3 series · 3 of 3 positions shown · non-contrast
Comparison: None.

CLINICAL DATA: Low back pain with right-sided radicular symptoms

EXAM:
LUMBAR SPINE - 2-3 VIEW

[l-spine ap]
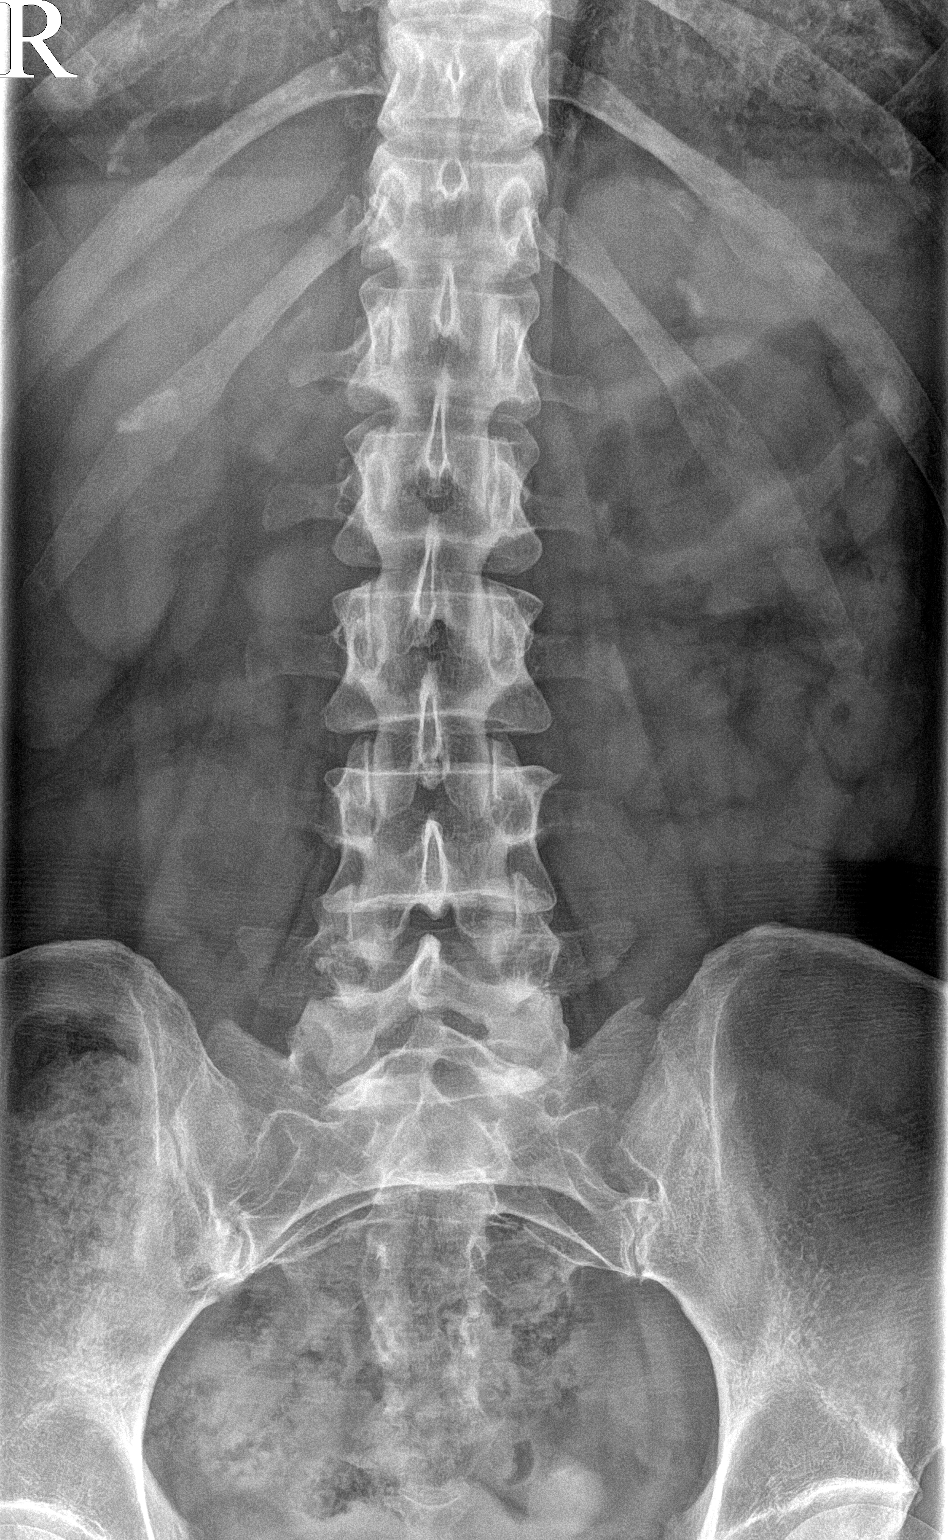

[l-spine lateral (1 of 2)]
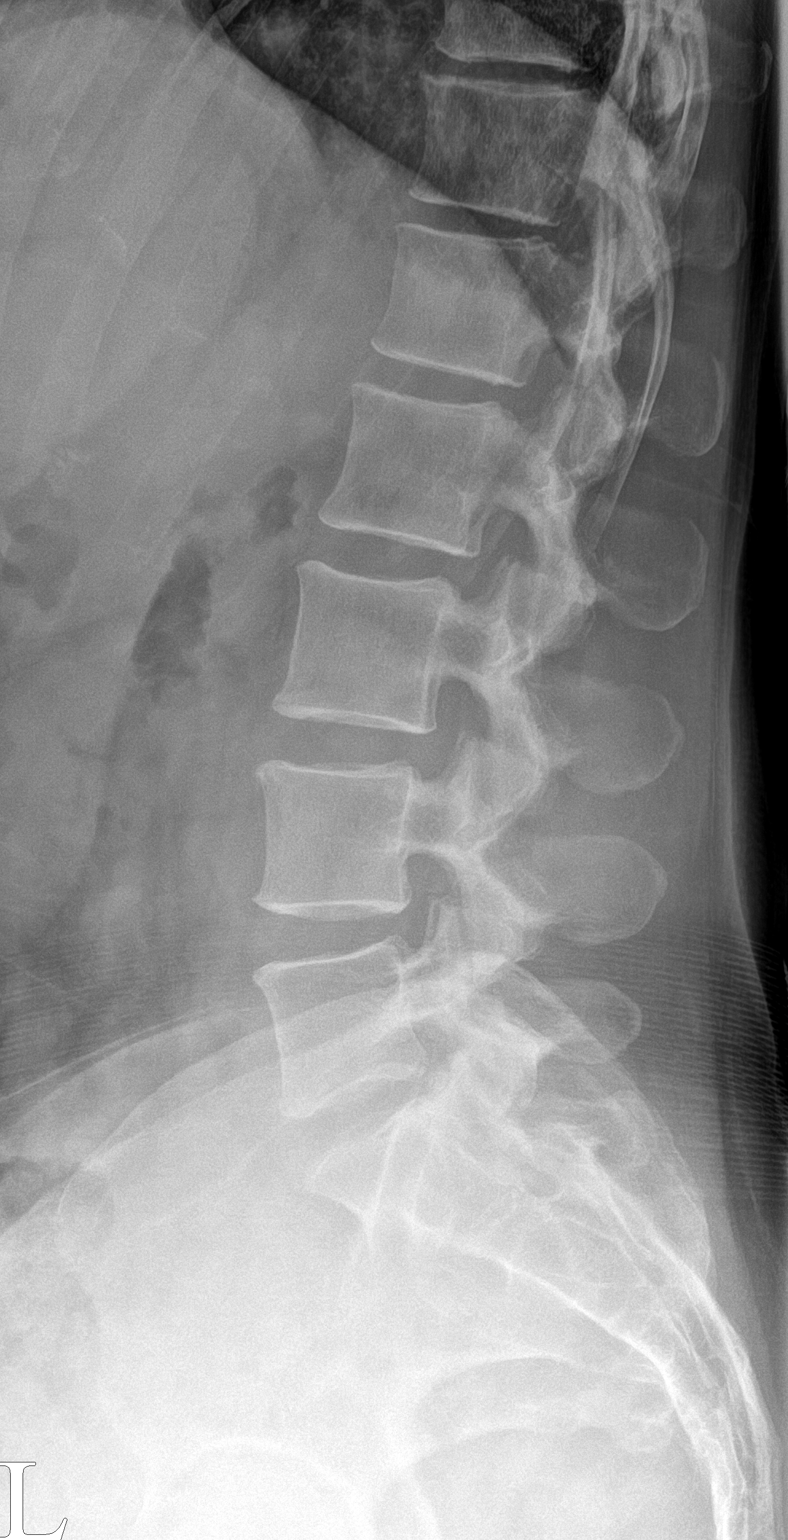

[l-spine lateral (2 of 2)]
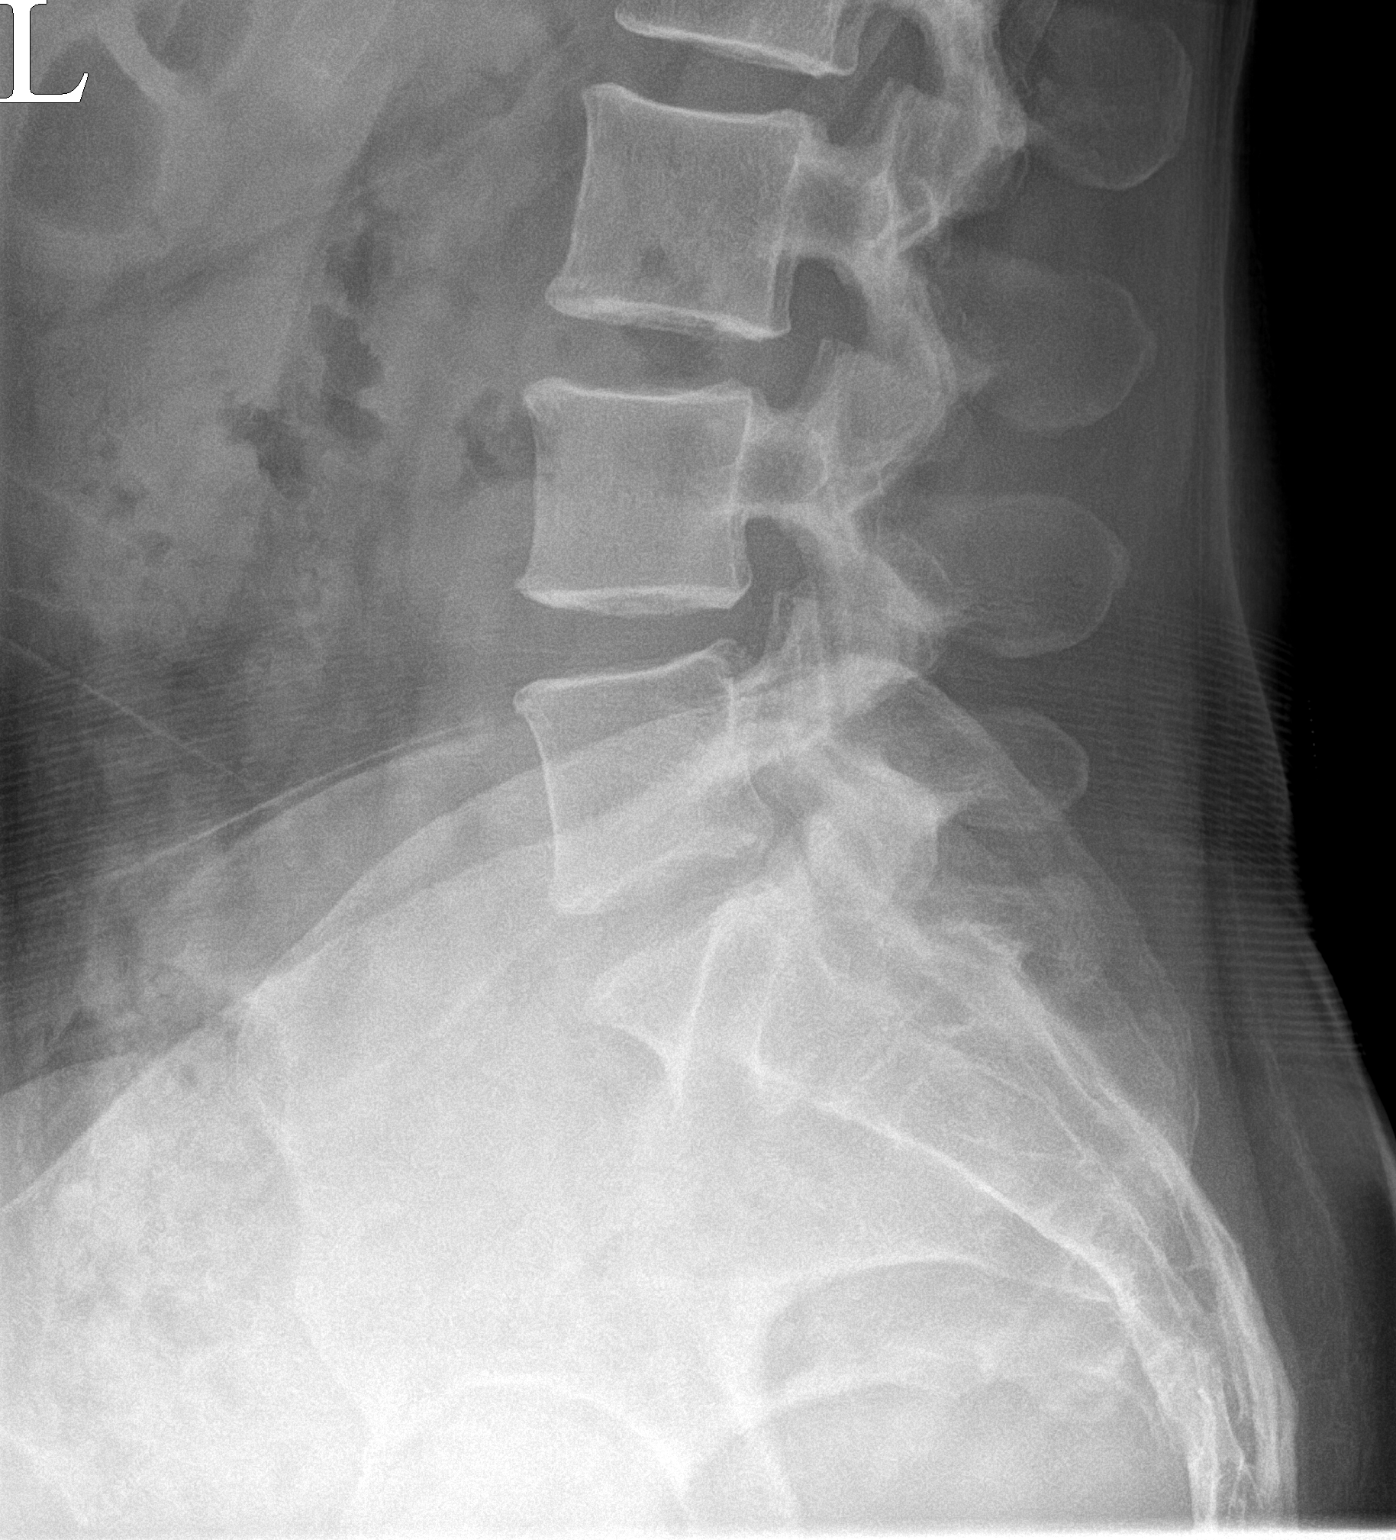

[3 of 3 positions shown; findings below may reference images not displayed]

FINDINGS: Frontal, lateral, and spot lumbosacral lateral images were obtained.
There are 5 non-rib-bearing lumbar type vertebral bodies. There is
minimal lumbar dextroscoliosis. There is no fracture or
spondylolisthesis. Disc spaces appear normal. No erosive change.
IMPRESSION: Slight scoliosis. No fracture or spondylolisthesis. No appreciable
arthropathy.

## 2020-09-11 MED ORDER — PREDNISONE 50 MG PO TABS: 50.0000 mg | ORAL_TABLET | Freq: Every day | ORAL | 0 refills | Status: DC

## 2020-09-11 MED ORDER — GABAPENTIN 300 MG PO CAPS: 300.0000 mg | ORAL_CAPSULE | Freq: Three times a day (TID) | ORAL | 3 refills | Status: DC | PRN

## 2020-09-11 NOTE — Progress Notes (Signed)
Bruce Donath, am serving as a scribe for Dr. Clementeen Graham. This visit occurred during the SARS-CoV-2 public health emergency.  Safety protocols were in place, including screening questions prior to the visit, additional usage of staff PPE, and extensive cleaning of exam room while observing appropriate contact time as indicated for disinfecting solutions.   Stephanie Mcguire is a 49 y.o. female who presents to [facility] today for low back pain.  She was most recently seen by Dr. Katrinka Blazing regarding her R knee and R knee meniscal tear.  Since her last visit w/ Dr. Katrinka Blazing, pt reports that her pain increased over the weekend. Pain intermittent since knee injury. Pain in right side of lower back and radiates down into the buttocks into the posterior thigh.  She does not have much pain radiating to the knee or below the level of the knee.. States that she has barely been able to walk. Using heating pad and Doans. Had similar pain about 8 years ago.     Pertinent review of systems: No fevers or chills  Relevant historical information: History of interstitial lung disease and goiter.  MRI right knee pending later this month.   Exam:  BP 124/86   Pulse 93   Ht 5\' 5"  (1.651 m)   Wt 152 lb (68.9 kg)   LMP 09/04/2020   SpO2 98%   BMI 25.29 kg/m  General: Well Developed, well nourished, and in no acute distress.   MSK: L-spine normal-appearing Nontender midline. Normal lumbar motion.  Negative slump test.  Extremity strength reflexes and sensation are intact distally.  Right hip normal-appearing normal motion.  Mildly tender palpation greater trochanter. Hip abduction and external rotation strength are intact however somewhat painful.    Lab and Radiology Results X-ray images lumbar spine obtained today personally and independently reviewed. Mild L5-S1 DDD. Await for radiology review    Assessment and Plan: 49 y.o. female with right low back pain  buttocks and hip pain.  Patient does not have pain radiating below the level of the posterior thigh or buttocks much at all.  Pain mostly due to either lumbosacral strain or hip abductor or rotator dysfunction or spasm possibly even piriformis syndrome.  Plan for trial of physical therapy.  We will go ahead and prescribe a short course of prednisone and gabapentin.  If not improving consider further imaging lumbar spine.    Orders Placed This Encounter  Procedures  . DG Lumbar Spine 2-3 Views    Standing Status:   Future    Number of Occurrences:   1    Standing Expiration Date:   09/11/2021    Order Specific Question:   Reason for Exam (SYMPTOM  OR DIAGNOSIS REQUIRED)    Answer:   eval poss right sciatica    Order Specific Question:   Is patient pregnant?    Answer:   No    Order Specific Question:   Preferred imaging location?    Answer:   09/13/2021    Order Specific Question:   Radiology Contrast Protocol - do NOT remove file path    Answer:   [email or web address]  . Ambulatory referral to Physical Therapy    Referral Priority:   Routine    Referral Type:   Physical Medicine    Referral Reason:   Specialty Services Required    Requested Specialty:   Physical Therapy   Meds ordered this encounter  Medications  . predniSONE (DELTASONE) 50 MG  tablet    Sig: Take 1 tablet (50 mg total) by mouth daily.    Dispense:  5 tablet    Refill:  0  . gabapentin (NEURONTIN) 300 MG capsule    Sig: Take 1 capsule (300 mg total) by mouth 3 (three) times daily as needed (nerve pain).    Dispense:  90 capsule    Refill:  3     Discussed warning signs or symptoms. Please see discharge instructions. Patient expresses understanding.   The above documentation has been reviewed and is accurate and complete Clementeen Graham, M.D.

## 2020-09-11 NOTE — Patient Instructions (Addendum)
Thank you for coming in today.  Plan for PT  Use heating pad and TENS unit.  Take the gabapentin as need for nerve pain mostly at bedtime.  Take the prednisone for 5 days if not better.  Get xray today.   Let me know if this is not working.   Can do MRI if needed in the future.    Piriformis Syndrome Rehab Ask your health care provider which exercises are safe for you. Do exercises exactly as told by your health care provider and adjust them as directed. It is normal to feel mild stretching, pulling, tightness, or discomfort as you do these exercises. Stop right away if you feel sudden pain or your pain gets worse. Do not begin these exercises until told by your health care provider. Stretching and range-of-motion exercises These exercises warm up your muscles and joints and improve the movement and flexibility of your hip and pelvis. The exercises also help to relieve pain, numbness, and tingling. Hip rotation This is an exercise in which you lie on your back and stretch the muscles that rotate your hip (hip rotators) to stretch your buttocks. 1. Lie on your back on a firm surface. 2. Pull your left / right knee toward your same shoulder with your left / right hand until your knee is pointing toward the ceiling. Hold your left / right ankle with your other hand. 3. Keeping your knee steady, gently pull your left / right ankle toward your other shoulder until you feel a stretch in your buttocks. 4. Hold this position for __________ seconds. Repeat __________ times. Complete this exercise __________ times a day. Hip extensor This is an exercise in which you lie on your back and pull your knee to your chest. 1. Lie on your back on a firm surface. Both of your legs should be straight. 2. Pull your left / right knee to your chest. Hold your leg in this position by holding onto the back of your thigh or the front of your knee. 3. Hold this position for __________ seconds. 4. Slowly return to  the starting position. Repeat __________ times. Complete this exercise __________ times a day. Strengthening exercises These exercises build strength and endurance in your hip and thigh muscles. Endurance is the ability to use your muscles for a long time, even after they get tired. Straight leg raises, side-lying This exercise strengthens the muscles that rotate the leg at the hip and move it away from your body (hip abductors). 1. Lie on your side with your left / right leg in the top position. Lie so your head, shoulder, knee, and hip line up. Bend your bottom knee to help you balance. 2. Lift your top leg 4-6 inches (10-15 cm) while keeping your toes pointed straight ahead. 3. Hold this position for __________ seconds. 4. Slowly lower your leg to the starting position. 5. Let your muscles relax completely after each repetition. Repeat __________ times. Complete this exercise __________ times a day. Hip abduction and rotation This is sometimes called quadruped (on hands and knees) exercises. 1. Get on your hands and knees on a firm, lightly padded surface. Your hands should be directly below your shoulders, and your knees should be directly below your hips. 2. Lift your left / right knee out to the side. Keep your knee bent. Do not twist your body. 3. Hold this position for __________ seconds. 4. Slowly lower your leg. Repeat __________ times. Complete this exercise __________ times a day. Straight leg  raises, face-down This exercise stretches the muscles that move your hips away from the front of the pelvis (hip extensors). 1. Lie on your abdomen on a bed or a firm surface with a pillow under your hips. 2. Squeeze your buttocks muscles and lift your left / right leg about 4-6 inches (10-15 cm) off the bed. Do not let your back arch. 3. Hold this position for __________ seconds. 4. Slowly lower your leg to the starting position. 5. Let your muscles relax completely after each  repetition. Repeat __________ times. Complete this exercise __________ times a day. This information is not intended to replace advice given to you by your health care provider. Make sure you discuss any questions you have with your health care provider. Document Revised: 04/06/2019 Document Reviewed: 10/06/2018 Elsevier Patient Education  2020 [employer]   Lumbosacral Strain Lumbosacral strain is an injury that causes pain in the lower back (lumbosacral spine). This injury usually happens from overstretching the muscles or ligaments along your spine. Ligaments are cord-like tissues that connect bones to other bones. A strain can affect one or more muscles or ligaments. What are the causes? This condition may be caused by:  A hard, direct hit to the back.  Overstretching the lower back muscles. This may result from: ? A fall. ? Lifting something heavy. ? Repetitive movements such as bending or crouching. What increases the risk? The following factors may make you more likely to develop this condition:  Participating in sports or activities that involve: ? A sudden twist of the back. ? Pushing or pulling motions.  Being overweight or obese.  Having poor strength and flexibility, especially tight hamstrings or weak muscles in the back or abdomen.  Having too much of a curve in the lower back.  Having a pelvis that is tilted forward. What are the signs or symptoms? The main symptom of this condition is pain in the lower back, at the site of the strain. Pain may also be felt down one or both legs. How is this diagnosed? This condition is diagnosed based on your symptoms, your medical history, and a physical exam. During the physical exam, your health care provider may push on certain areas of your back to find the source of your pain. You may be asked to bend forward, backward, and side to side to check your pain and range of motion. You may also have imaging tests, such as X-rays  and an MRI. How is this treated? This condition may be treated by:  Applying heat and cold on the affected area.  Taking medicines to help relieve pain and relax your muscles.  Taking NSAIDs, such as ibuprofen, to help reduce swelling and discomfort.  Doing stretching and strengthening exercises for your lower back. Symptoms usually improve within several weeks of treatment. However, recovery time varies. When your symptoms improve, gradually return to your normal routine as soon as possible to reduce pain, avoid stiffness, and keep muscle strength. Follow these instructions at home: Medicines  Take over-the-counter and prescription medicines only as told by your health care provider.  Ask your health care provider if the medicine prescribed to you: ? Requires you to avoid driving or using heavy machinery. ? Can cause constipation. You may need to take these actions to prevent or treat constipation:  Drink enough fluid to keep your urine pale yellow.  Take over-the-counter or prescription medicines.  Eat foods that are high in fiber, such as beans, whole grains, and fresh fruits and  vegetables.  Limit foods that are high in fat and processed sugars, such as fried or sweet foods. Managing pain, stiffness, and swelling      If directed, put ice on the injured area. To do this: ? Put ice in a plastic bag. ? Place a towel between your skin and the bag. ? Leave the ice on for 20 minutes, 2-3 times a day.  If directed, apply heat on the affected area as often as told by your health care provider. Use the heat source that your health care provider recommends, such as a moist heat pack or a heating pad. ? Place a towel between your skin and the heat source. ? Leave the heat on for 20-30 minutes. ? Remove the heat if your skin turns bright red. This is especially important if you are unable to feel pain, heat, or cold. You may have a greater risk of getting burned. Activity  Rest  as told by your health care provider.  Do not stay in bed. Staying in bed for more than 1-2 days can delay your recovery.  Return to your normal activities as told by your health care provider. Ask your health care provider what activities are safe for you.  Avoid activities that take a lot of energy for as long as told by your health care provider.  Do exercises as told by your health care provider. This includes stretching and strengthening exercises. General instructions  Sit up and stand up straight. Avoid leaning forward when you sit, or hunching over when you stand.  Do not use any products that contain nicotine or tobacco, such as cigarettes, e-cigarettes, and chewing tobacco. If you need help quitting, ask your health care provider.  Keep all follow-up visits as told by your health care provider. This is important. How is this prevented?   Use correct form when playing sports and lifting heavy objects.  Use good posture when sitting and standing.  Maintain a healthy weight.  Sleep on a mattress with medium firmness to support your back.  Do at least 150 minutes of moderate-intensity exercise each week, such as brisk walking or water aerobics. Try a form of exercise that takes stress off your back, such as swimming or stationary cycling.  Maintain physical fitness, including: ? Strength. ? Flexibility. Contact a health care provider if:  Your back pain does not improve after several weeks of treatment.  Your symptoms get worse. Get help right away if:  Your back pain is severe.  You cannot stand or walk.  You have difficulty controlling when you urinate or when you have a bowel movement.  You feel nauseous or you vomit.  Your feet or legs get very cold, turn pale, or look blue.  You have numbness, tingling, weakness, or problems using your arms or legs.  You develop any of the following: ? Shortness of breath. ? Dizziness. ? Pain in your legs. ? Weakness  in your buttocks or legs. Summary  Lumbosacral strain is an injury that causes pain in the lower back (lumbosacral spine).  This injury usually happens from overstretching the muscles or ligaments along your spine.  This condition may be caused by a direct hit to the lower back or by overstretching the lower back muscles.  Symptoms usually improve within several weeks of treatment. This information is not intended to replace advice given to you by your health care provider. Make sure you discuss any questions you have with your health care provider. Document Revised:  05/09/2019 Document Reviewed: 05/09/2019 Elsevier Patient Education  2020 [employer].

## 2020-09-12 NOTE — Addendum Note (Signed)
Addended by: Evon Slack on: 09/12/2020 03:14 PM   Modules accepted: Orders

## 2020-09-13 ENCOUNTER — Encounter: Payer: Self-pay | Admitting: Family Medicine

## 2020-09-13 NOTE — Progress Notes (Signed)
X-ray shows slight scoliosis of lumbar spine otherwise looks pretty normal to radiology.

## 2020-09-14 ENCOUNTER — Other Ambulatory Visit: Payer: BC Managed Care – PPO

## 2020-09-14 ENCOUNTER — Other Ambulatory Visit: Payer: Self-pay | Admitting: Critical Care Medicine

## 2020-09-14 DIAGNOSIS — Z20822 Contact with and (suspected) exposure to covid-19: Secondary | ICD-10-CM | POA: Diagnosis not present

## 2020-09-16 ENCOUNTER — Ambulatory Visit: Payer: BC Managed Care – PPO | Admitting: Family Medicine

## 2020-09-16 LAB — SARS-COV-2, NAA 2 DAY TAT

## 2020-09-16 LAB — NOVEL CORONAVIRUS, NAA: SARS-CoV-2, NAA: NOT DETECTED

## 2020-09-24 ENCOUNTER — Other Ambulatory Visit: Payer: Self-pay

## 2020-09-24 ENCOUNTER — Ambulatory Visit
Admission: RE | Admit: 2020-09-24 | Discharge: 2020-09-24 | Disposition: A | Discharge status: Home / Self Care | Payer: BC Managed Care – PPO | Source: Ambulatory Visit | Attending: Family Medicine | Admitting: Family Medicine

## 2020-09-24 DIAGNOSIS — M5116 Intervertebral disc disorders with radiculopathy, lumbar region: Secondary | ICD-10-CM | POA: Diagnosis not present

## 2020-09-24 DIAGNOSIS — M25561 Pain in right knee: Secondary | ICD-10-CM | POA: Diagnosis not present

## 2020-09-24 DIAGNOSIS — S83241A Other tear of medial meniscus, current injury, right knee, initial encounter: Secondary | ICD-10-CM

## 2020-09-24 IMAGING — MR MR KNEE*R* W/O CM
4 of 6 series · 23 of 40 positions shown · non-contrast
Comparison: Radiographs [DATE]

CLINICAL DATA: Right knee pain since [REDACTED].

EXAM:
MRI OF THE RIGHT KNEE WITHOUT CONTRAST
TECHNIQUE: Multiplanar, multisequence MR imaging of the knee was performed. No
intravenous contrast was administered.

[Series 4: T1 · coronal · 4.0mm · 0.29mm/px · 3 of 23 slices shown]
[im 5/23]
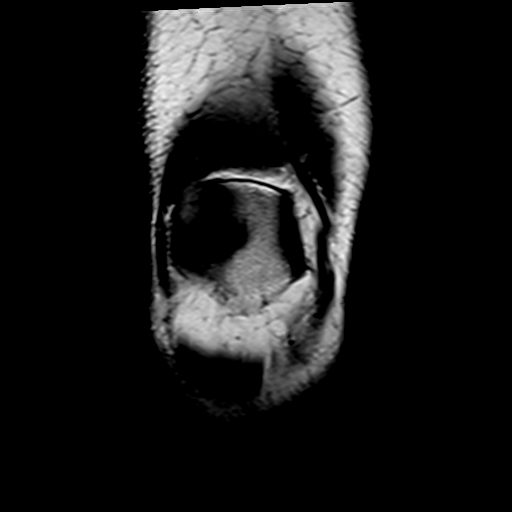
[im 14/23]
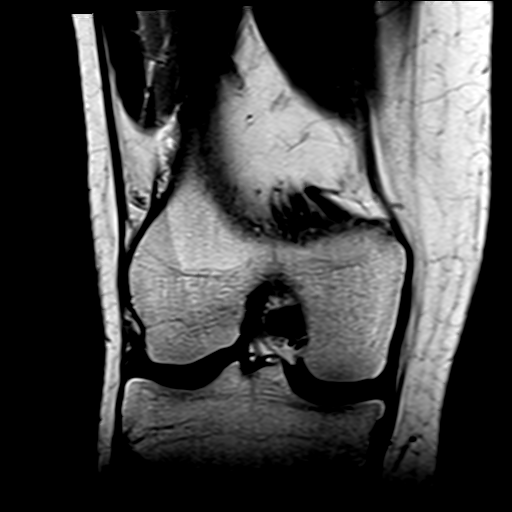
[im 23/23]
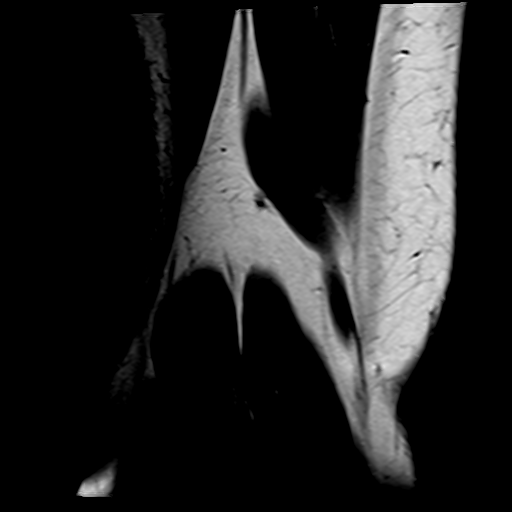

[Series 5: T2 fat-sat · coronal · 4.0mm · 0.59mm/px · 6 of 23 slices shown (1 of 2)]
[im 1/23]
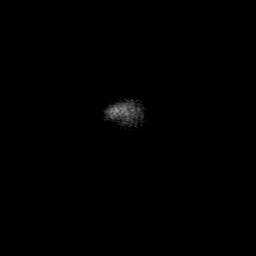
[im 5/23]
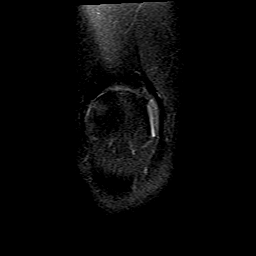
[im 9/23]
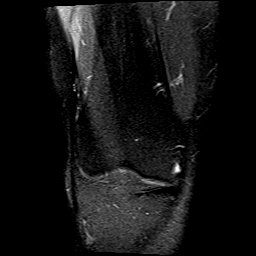
[im 14/23]
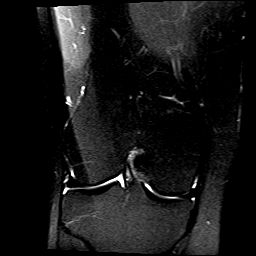
[im 18/23]
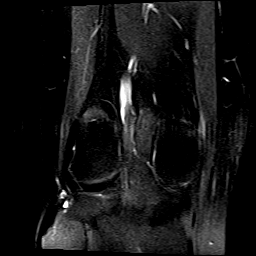
[im 23/23]
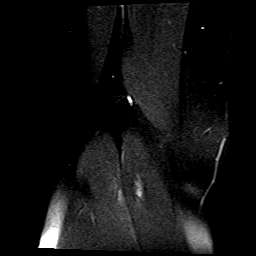

[Series 7: PD fat-sat · sagittal · 3.0mm · 0.29mm/px · 7 of 28 slices shown]
[im 1/28]
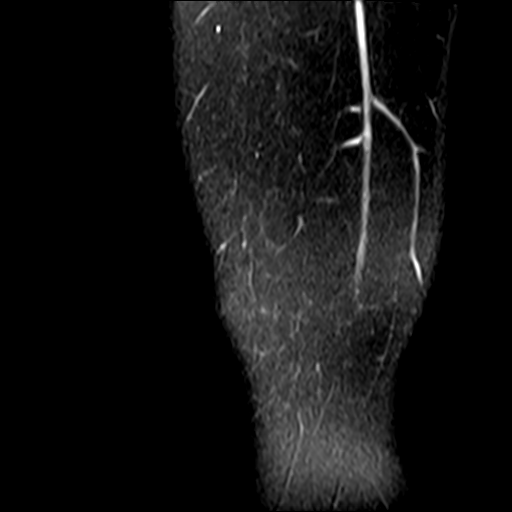
[im 5/28]
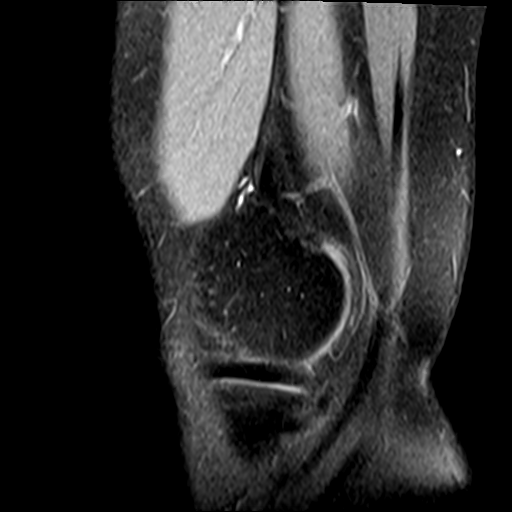
[im 10/28]
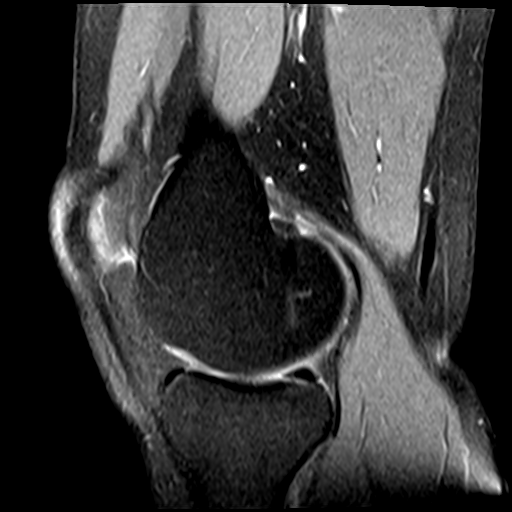
[im 14/28]
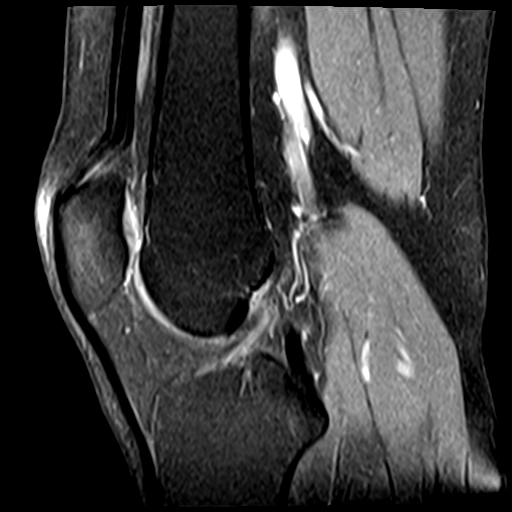
[im 19/28]
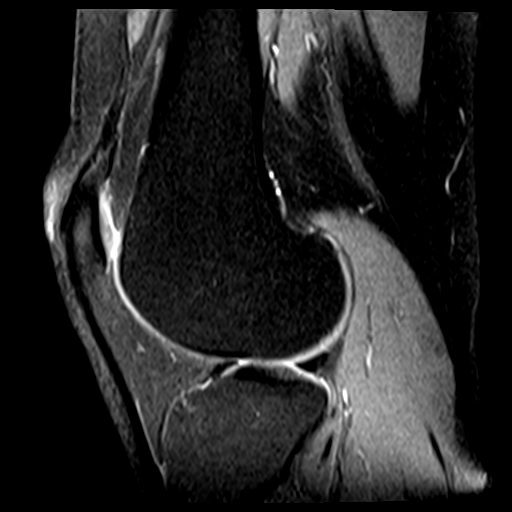
[im 23/28]
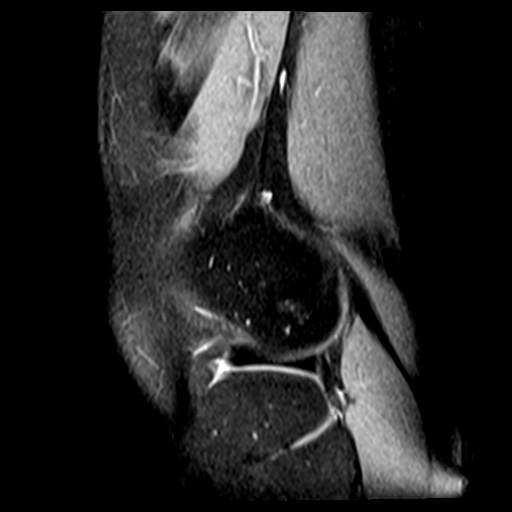
[im 28/28]
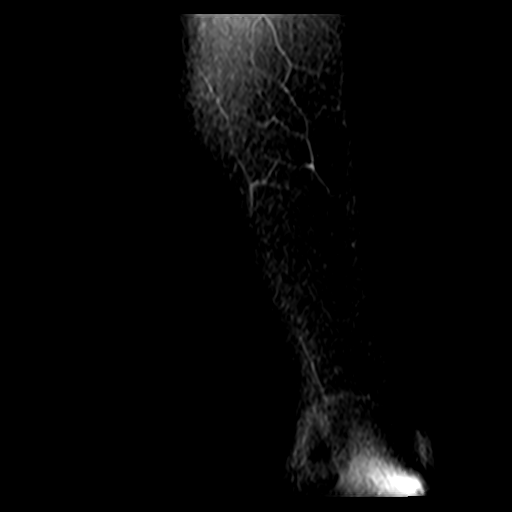

[Series 8: T2 fat-sat · sagittal · 3.0mm · 0.29mm/px · 7 of 28 slices shown (2 of 2)]
[im 1/28]
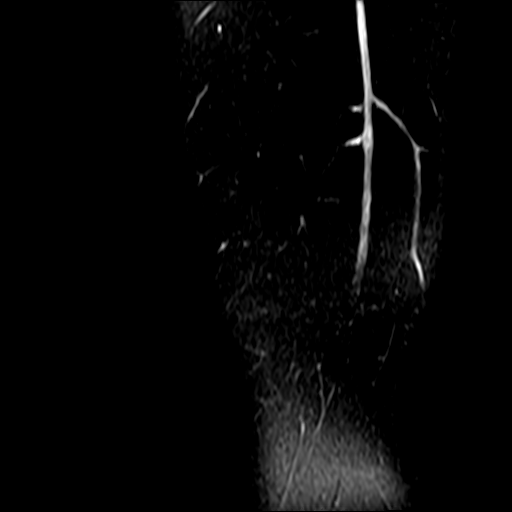
[im 5/28]
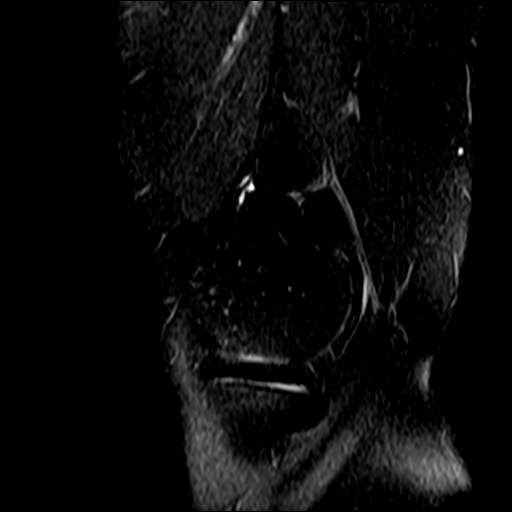
[im 10/28]
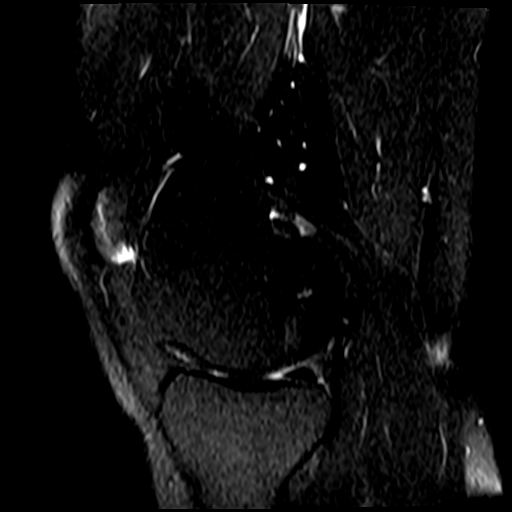
[im 14/28]
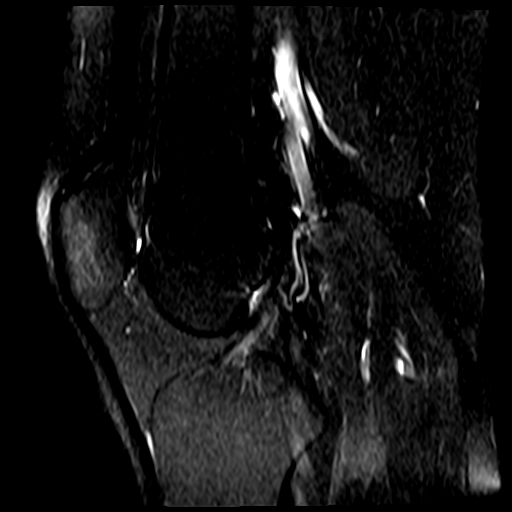
[im 19/28]
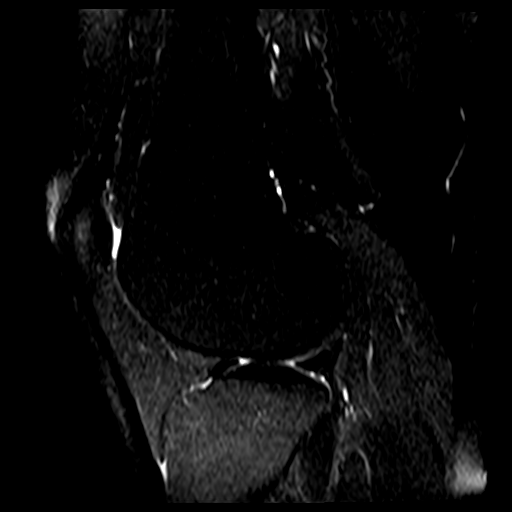
[im 23/28]
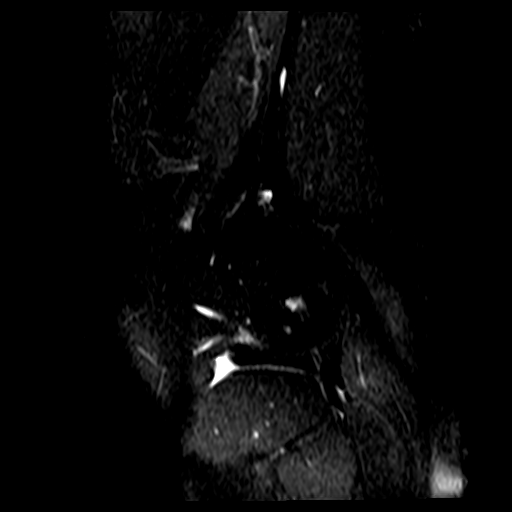
[im 28/28]
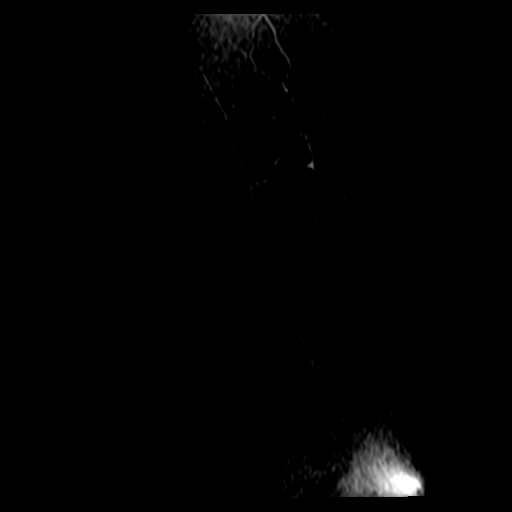

[23 of 40 positions shown; findings below may reference images not displayed]

FINDINGS: MENISCI

Medial meniscus:  Intact

Lateral meniscus:  Intact

LIGAMENTS

Cruciates:  Intact.  Mild mucoid degeneration of the ACL.

Collaterals:  Intact

CARTILAGE

Patellofemoral: Moderate degenerative chondrosis mainly involving
the lower patellar cartilage.

Medial:  Mild degenerative chondrosis.

Lateral:  Mild degenerative chondrosis.

Joint: Small amount of joint fluid but no overt joint effusion.
Slight thickening and minimal inflammation of the quadriceps fat
pad.

Popliteal Fossa:  Tiny Baker's cyst.

Extensor Mechanism: The patella retinacular structures are intact
and the quadriceps and patellar tendons are intact.

Bones: No acute bony findings. No bone lesion or osteochondral
abnormality.

Other: Unremarkable knee musculature.
IMPRESSION: 1. Intact ligamentous structures and no acute bony findings.
2. No meniscal tears.
3. Moderate degenerative chondrosis mainly involving the lower
patellar cartilage.
4. Slight thickening and minimal inflammation of the quadriceps fat
pad.
5. Tiny Baker's cyst.

## 2020-09-25 ENCOUNTER — Encounter: Payer: Self-pay | Admitting: Family Medicine

## 2020-09-30 DIAGNOSIS — M5116 Intervertebral disc disorders with radiculopathy, lumbar region: Secondary | ICD-10-CM | POA: Diagnosis not present

## 2020-09-30 NOTE — Progress Notes (Signed)
[name] [address] [place] [address] Phone: [phone] Subjective:   I Stephanie Mcguire am serving as a Neurosurgeon for Dr. Antoine Primas.  This visit occurred during the SARS-CoV-2 public health emergency.  Safety protocols were in place, including screening questions prior to the visit, additional usage of staff PPE, and extensive cleaning of exam room while observing appropriate contact time as indicated for disinfecting solutions.   I'm seeing this patient by the request  of:  Patient, No Pcp Per  CC: Mcguire pain follow-up  [id number]  Stephanie Mcguire is a 49 y.o. female coming in with complaint of right Mcguire pain. Patient states she is doing ok today. Believes she is doing better but still painful.  Still has feelings of instability and locking on her from time to time.  MRI 09/24/2020 IMPRESSION: 1. Intact ligamentous structures and no acute bony findings. 2. No meniscal tears. 3. 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 4. Slight thickening and minimal inflammation of the quadriceps fat pad. 5. Tiny Baker's cyst.     Past Medical History:  Diagnosis Date  . Asthma   . Bipolar 2 disorder (HCC)   . Thyroid disease    Past Surgical History:  Procedure Laterality Date  . BUNIONECTOMY    . MANDIBLE SURGERY  2009  . RECONSTRUCTION OF EYELID  1989 and 1990   Social History   Socioeconomic History  . Marital status: Married    Spouse name: Not on file  . Number of children: Not on file  . Years of education: Not on file  . Highest education level: Not on file  Occupational History  . Not on file  Tobacco Use  . Smoking status: Never Smoker  . Smokeless tobacco: Never Used  Vaping Use  . Vaping Use: Never used  Substance and Sexual Activity  . Alcohol use: Yes    Alcohol/week: 2.0 standard drinks    Types: 2 Glasses of wine per week  . Drug use: No  . Sexual activity: Yes    Birth  control/protection: Pill  Other Topics Concern  . Not on file  Social History Narrative  . Not on file   Social Determinants of Health   Financial Resource Strain:   . Difficulty of Paying Living Expenses: Not on file  Food Insecurity:   . Worried About Programme researcher, broadcasting/film/video in the Last Year: Not on file  . Ran Out of Food in the Last Year: Not on file  Transportation Needs:   . Lack of Transportation (Medical): Not on file  . Lack of Transportation (Non-Medical): Not on file  Physical Activity:   . Days of Exercise per Week: Not on file  . Minutes of Exercise per Session: Not on file  Stress:   . Feeling of Stress : Not on file  Social Connections:   . Frequency of Communication with Friends and Family: Not on file  . Frequency of Social Gatherings with Friends and Family: Not on file  . Attends Religious Services: Not on file  . Active Member of Clubs or Organizations: Not on file  . Attends Banker Meetings: Not on file  . Marital Status: Not on file   No Known Allergies Family History  Problem Relation Age of Onset  . Parkinson's disease Mother   . Hypertension Father     Current Outpatient Medications (Endocrine & Metabolic):  .  levothyroxine (SYNTHROID, LEVOTHROID) 100 MCG tablet, Take 100  mcg by mouth daily before breakfast. .  norethindrone-ethinyl estradiol (LOESTRIN) 1-20 MG-MCG tablet, TAKE 1 TABLET DAILY .  predniSONE (DELTASONE) 50 MG tablet, Take 1 tablet (50 mg total) by mouth daily.   Current Outpatient Medications (Respiratory):  .  cetirizine-pseudoephedrine (ZYRTEC-D) 5-120 MG tablet, Take 1 tablet by mouth 2 (two) times daily as needed for allergies. .  fluticasone (FLONASE) 50 MCG/ACT nasal spray, Place 2 sprays into both nostrils daily.    Current Outpatient Medications (Other):  Marland Kitchen  buPROPion (WELLBUTRIN XL) 150 MG 24 hr tablet, Take 150 mg by mouth daily. .  clonazePAM (KLONOPIN) 0.5 MG tablet, Take 0.5 mg by mouth daily. .   divalproex (DEPAKOTE ER) 500 MG 24 hr tablet,  .  gabapentin (NEURONTIN) 300 MG capsule, Take 1 capsule (300 mg total) by mouth 3 (three) times daily as needed (nerve pain).   Reviewed prior external information including notes and imaging from  primary care provider As well as notes that were available from care everywhere and other healthcare systems.  Past medical history, social, surgical and family history all reviewed in electronic medical record.  No pertanent information unless stated regarding to the chief complaint.   Review of Systems:  No headache, visual changes, nausea, vomiting, diarrhea, constipation, dizziness, abdominal pain, skin rash, fevers, chills, night sweats, weight loss, swollen lymph nodes, body aches, joint swelling, chest pain, shortness of breath, mood changes. POSITIVE muscle aches  Objective  Last menstrual period 09/04/2020.   General: No apparent distress alert and oriented x3 mood and affect normal, dressed appropriately.  HEENT: Pupils equal, extraocular movements intact  Respiratory: Patient's speak in full sentences and does not appear short of breath  Cardiovascular: No lower extremity edema, non tender, no erythema  Neuro: Cranial nerves II through XII are intact, neurovascularly intact in all extremities with 2+ DTRs and 2+ pulses.  Gait normal with good balance and coordination.  MSK:  Right Mcguire exam shows the patient does have mild lateral tracking of the patella noted.  Positive patellar grind.  No significant instability.  Still mild positive McMurray's noted though.  After informed written and verbal consent, patient was seated on exam table. Right Mcguire was prepped with alcohol swab and utilizing anterolateral approach, patient's right Mcguire space was injected with 4:1  marcaine 0.5%: Kenalog 40mg /dL. Patient tolerated the procedure well without immediate complications.    Impression and Recommendations:     The above documentation has been  reviewed and is accurate and complete , DO

## 2020-10-01 ENCOUNTER — Encounter: Payer: Self-pay | Admitting: Family Medicine

## 2020-10-01 ENCOUNTER — Other Ambulatory Visit: Payer: Self-pay

## 2020-10-01 ENCOUNTER — Ambulatory Visit (INDEPENDENT_AMBULATORY_CARE_PROVIDER_SITE_OTHER): Payer: BC Managed Care – PPO | Admitting: Family Medicine

## 2020-10-01 DIAGNOSIS — M1711 Unilateral primary osteoarthritis, right knee: Secondary | ICD-10-CM | POA: Diagnosis not present

## 2020-10-01 NOTE — Patient Instructions (Signed)
Good to see you injected the knee today  Ice is your friend Will take about 2 weeks Ok to increase activity starting tomorrow  See me again in 6 weeks and we will get approval for gel injection just in case

## 2020-10-01 NOTE — Assessment & Plan Note (Signed)
Chondral thinning noted on MRI.  Patient's meniscus appear to be unremarkable.  Discussed with patient about icing regimen, home exercises, patella strapping, patient will likely be a candidate for viscosupplementation and we will see if we can get her approval.  Increase activity slowly.  Follow-up again 6 to 8 weeks

## 2020-10-02 DIAGNOSIS — M5116 Intervertebral disc disorders with radiculopathy, lumbar region: Secondary | ICD-10-CM | POA: Diagnosis not present

## 2020-10-08 DIAGNOSIS — M5116 Intervertebral disc disorders with radiculopathy, lumbar region: Secondary | ICD-10-CM | POA: Diagnosis not present

## 2020-10-15 DIAGNOSIS — M5116 Intervertebral disc disorders with radiculopathy, lumbar region: Secondary | ICD-10-CM | POA: Diagnosis not present

## 2020-10-16 NOTE — Progress Notes (Signed)
[facility] [address] [place] [address] Phone: [phone] Subjective:    I'm seeing this patient by the request  of:  Patient, No Pcp Per  CC: Low back pain  UVO:[id number]   10/01/2020 Chondral thinning noted on MRI.  Patient's meniscus appear to be unremarkable.  Discussed with patient about icing regimen, home exercises, patella strapping, patient will likely be a candidate for viscosupplementation and we will see if we can get her approval.  Increase activity slowly.  Follow-up again 6 to 8 weeks  Update 10/16/2020 Stephanie Mcguire is a 49 y.o. female coming in with complaint of back pain. Saw Dr. Denyse Amass on 09/11/2020 and was diagnosed with piriformis syndrome. Patient states that she has been going to PT a couple times a week and physical therapist thinks it is a bulging disk issue. Patient feels better but still has unexpected sharp significant pain in lower back and sometimes radiates down R leg.  Patient states that it continues to give her radicular symptoms intermittently.  Patient is concerned because exam does continue to give her enough trouble to stop her from certain activities.     Past Medical History:  Diagnosis Date  . Asthma   . Bipolar 2 disorder (HCC)   . Thyroid disease    Past Surgical History:  Procedure Laterality Date  . BUNIONECTOMY    . MANDIBLE SURGERY  2009  . RECONSTRUCTION OF EYELID  1989 and 1990   Social History   Socioeconomic History  . Marital status: Married    Spouse name: Not on file  . Number of children: Not on file  . Years of education: Not on file  . Highest education level: Not on file  Occupational History  . Not on file  Tobacco Use  . Smoking status: Never Smoker  . Smokeless tobacco: Never Used  Vaping Use  . Vaping Use: Never used  Substance and Sexual Activity  . Alcohol use: Yes    Alcohol/week: 2.0 standard drinks    Types: 2 Glasses of wine per week  . Drug use: No  .  Sexual activity: Yes    Birth control/protection: Pill  Other Topics Concern  . Not on file  Social History Narrative  . Not on file   Social Determinants of Health   Financial Resource Strain:   . Difficulty of Paying Living Expenses: Not on file  Food Insecurity:   . Worried About Programme researcher, broadcasting/film/video in the Last Year: Not on file  . Ran Out of Food in the Last Year: Not on file  Transportation Needs:   . Lack of Transportation (Medical): Not on file  . Lack of Transportation (Non-Medical): Not on file  Physical Activity:   . Days of Exercise per Week: Not on file  . Minutes of Exercise per Session: Not on file  Stress:   . Feeling of Stress : Not on file  Social Connections:   . Frequency of Communication with Friends and Family: Not on file  . Frequency of Social Gatherings with Friends and Family: Not on file  . Attends Religious Services: Not on file  . Active Member of Clubs or Organizations: Not on file  . Attends Banker Meetings: Not on file  . Marital Status: Not on file   No Known Allergies Family History  Problem Relation Age of Onset  . Parkinson's disease Mother   . Hypertension Father     Current Outpatient Medications (Endocrine &  Metabolic):  .  levothyroxine (SYNTHROID, LEVOTHROID) 100 MCG tablet, Take 100 mcg by mouth daily before breakfast. .  norethindrone-ethinyl estradiol (LOESTRIN) 1-20 MG-MCG tablet, TAKE 1 TABLET DAILY .  predniSONE (DELTASONE) 50 MG tablet, Take 1 tablet (50 mg total) by mouth daily. (Patient not taking: Reported on 10/17/2020)   Current Outpatient Medications (Respiratory):  .  cetirizine-pseudoephedrine (ZYRTEC-D) 5-120 MG tablet, Take 1 tablet by mouth 2 (two) times daily as needed for allergies. .  fluticasone (FLONASE) 50 MCG/ACT nasal spray, Place 2 sprays into both nostrils daily.    Current Outpatient Medications (Other):  Marland Kitchen  buPROPion (WELLBUTRIN XL) 150 MG 24 hr tablet, Take 150 mg by mouth daily. .   clonazePAM (KLONOPIN) 0.5 MG tablet, Take 0.5 mg by mouth daily. .  divalproex (DEPAKOTE ER) 500 MG 24 hr tablet,  .  gabapentin (NEURONTIN) 300 MG capsule, Take 1 capsule (300 mg total) by mouth 3 (three) times daily as needed (nerve pain).   Reviewed prior external information including notes and imaging from  primary care provider As well as notes that were available from care everywhere and other healthcare systems.  Past medical history, social, surgical and family history all reviewed in electronic medical record.  No pertanent information unless stated regarding to the chief complaint.   Review of Systems:  No headache, visual changes, nausea, vomiting, diarrhea, constipation, dizziness, abdominal pain, skin rash, fevers, chills, night sweats, weight loss, swollen lymph nodes, body aches, joint swelling, chest pain, shortness of breath, mood changes. POSITIVE muscle aches  Objective  Blood pressure 130/80, pulse 97, height 5\' 5"  (1.651 m), weight 153 lb (69.4 kg), SpO2 98 %.   General: No apparent distress alert and oriented x3 mood and affect normal, dressed appropriately.  HEENT: Pupils equal, extraocular movements intact  Respiratory: Patient's speak in full sentences and does not appear short of breath  Cardiovascular: No lower extremity edema, non tender, no erythema  Neuro: Cranial nerves II through XII are intact, neurovascularly intact in all extremities with 2+ DTRs and 2+ pulses.  Gait normal with good balance and coordination.  MSK: Low back exam shows the patient does have some mild loss of lordosis.  Tenderness to palpation over the L4, L5 and S1 area of the lumbar spine.  Positive straight leg at 25 degrees with any type of flexion.  Neurovascular intact distally.   Impression and Recommendations:     The above documentation has been reviewed and is accurate and complete , DO

## 2020-10-17 ENCOUNTER — Other Ambulatory Visit: Payer: Self-pay

## 2020-10-17 ENCOUNTER — Encounter: Payer: Self-pay | Admitting: Family Medicine

## 2020-10-17 ENCOUNTER — Ambulatory Visit (INDEPENDENT_AMBULATORY_CARE_PROVIDER_SITE_OTHER): Payer: BC Managed Care – PPO | Admitting: Family Medicine

## 2020-10-17 VITALS — BP 130/80 | HR 97 | Ht 65.0 in | Wt 153.0 lb

## 2020-10-17 DIAGNOSIS — M545 Low back pain, unspecified: Secondary | ICD-10-CM | POA: Diagnosis not present

## 2020-10-17 DIAGNOSIS — M5416 Radiculopathy, lumbar region: Secondary | ICD-10-CM | POA: Insufficient documentation

## 2020-10-17 NOTE — Assessment & Plan Note (Signed)
Lumbar radiculopathy.  Patient was treated with physical therapy and the piriformis.  Very minimal improvement overall she states.  Continues to have radicular symptoms with a positive straight leg test noted.  Patient is still adamant that the pain is stopping daily activities.  And sometimes even is painful at night.  We will get advanced imaging at this time because patient would be a candidate for potential injections.  Continue the gabapentin for now.

## 2020-10-17 NOTE — Patient Instructions (Addendum)
Good to see you  Ordered MRI for [facility] [phone] Ill write you after the MRI with next steps

## 2020-11-08 ENCOUNTER — Ambulatory Visit
Admission: RE | Admit: 2020-11-08 | Discharge: 2020-11-08 | Disposition: A | Discharge status: Home / Self Care | Payer: BC Managed Care – PPO | Source: Ambulatory Visit | Attending: Family Medicine | Admitting: Family Medicine

## 2020-11-08 ENCOUNTER — Other Ambulatory Visit: Payer: Self-pay

## 2020-11-08 DIAGNOSIS — M545 Low back pain, unspecified: Secondary | ICD-10-CM | POA: Diagnosis not present

## 2020-11-08 IMAGING — MR MR LUMBAR SPINE W/O CM
4 of 5 series · 26 of 48 positions shown · non-contrast
Comparison: [DATE].  [DATE].

CLINICAL DATA: Lumbar region back pain radiating to the right leg
beginning 2 months ago.

EXAM:
MRI LUMBAR SPINE WITHOUT CONTRAST
TECHNIQUE: Multiplanar, multisequence MR imaging of the lumbar spine was
performed. No intravenous contrast was administered.

[Series 3: T2 post-contrast · sagittal · 4.0mm · 0.53mm/px · 6 of 16 slices shown]
[im 1/16]
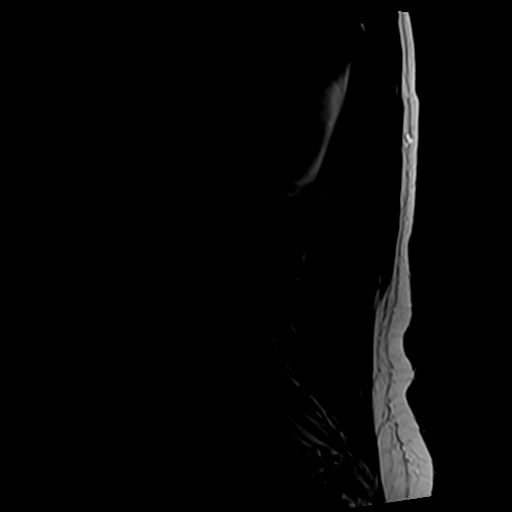
[im 4/16]
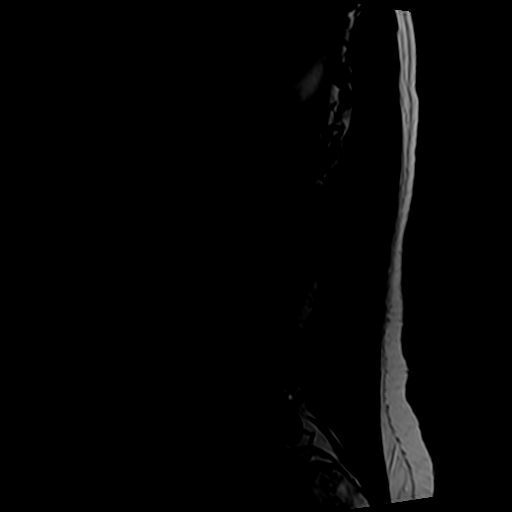
[im 7/16]
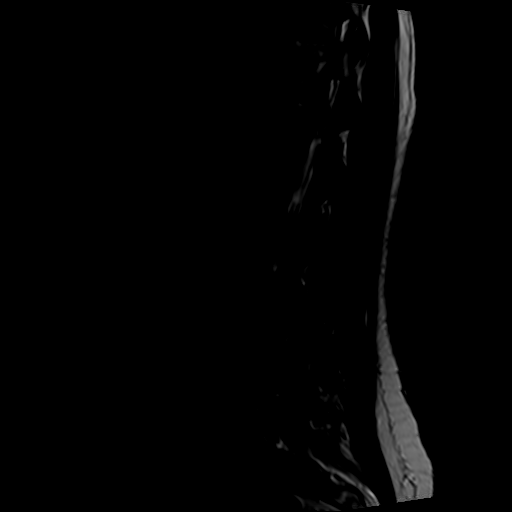
[im 10/16]
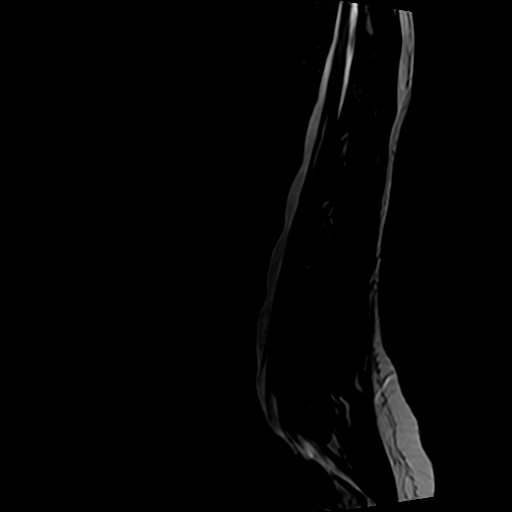
[im 13/16]
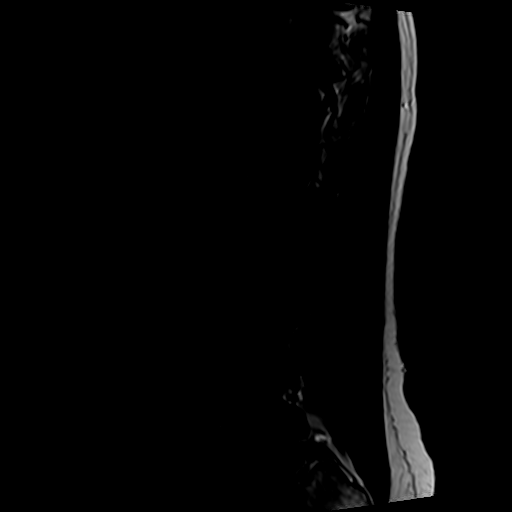
[im 16/16]
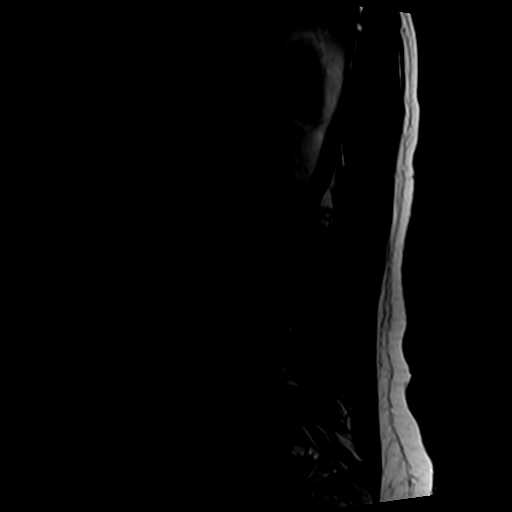

[Series 5: T1 · sagittal · 4.0mm · 0.53mm/px · 6 of 16 slices shown (1 of 2)]
[im 1/16]
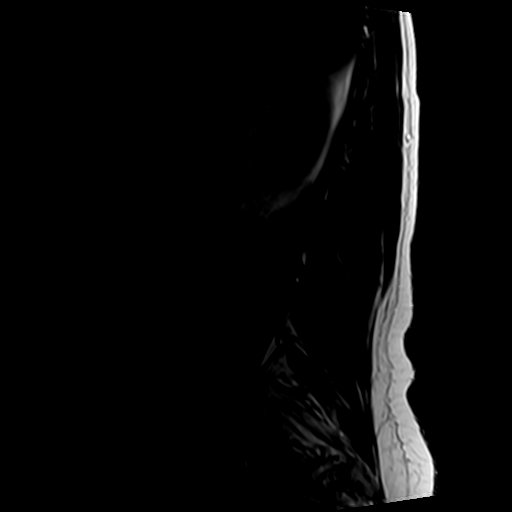
[im 4/16]
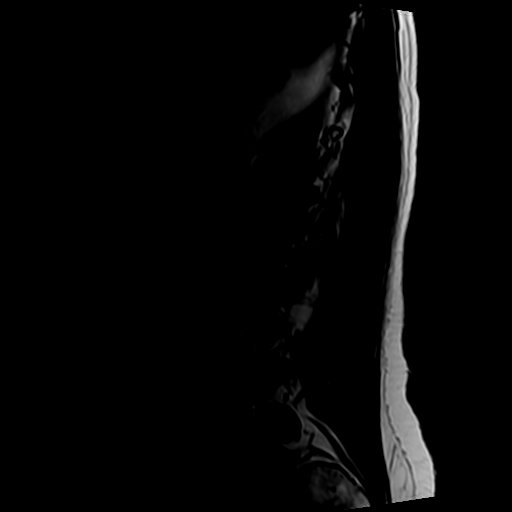
[im 7/16]
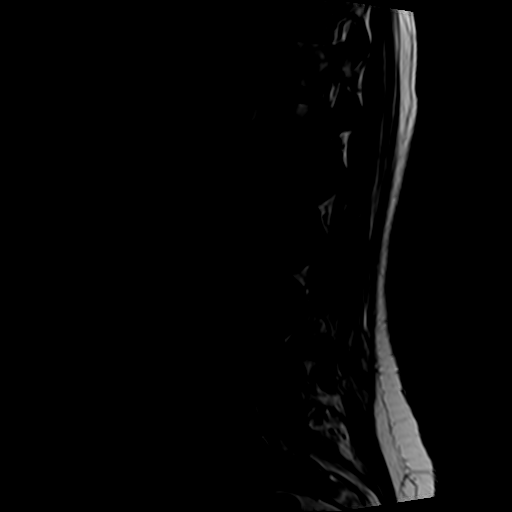
[im 10/16]
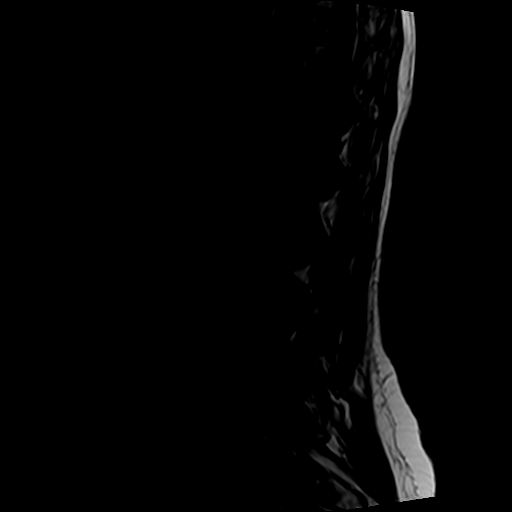
[im 13/16]
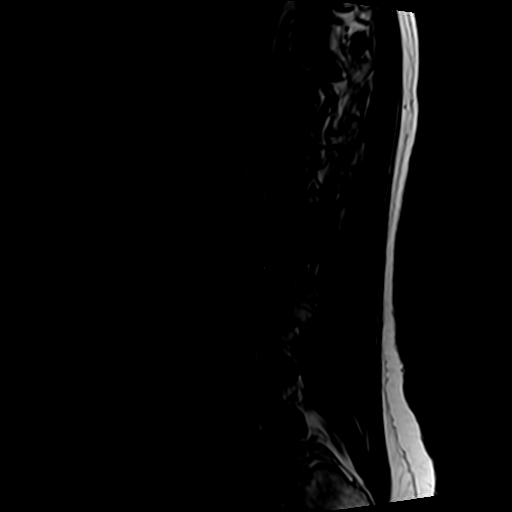
[im 16/16]
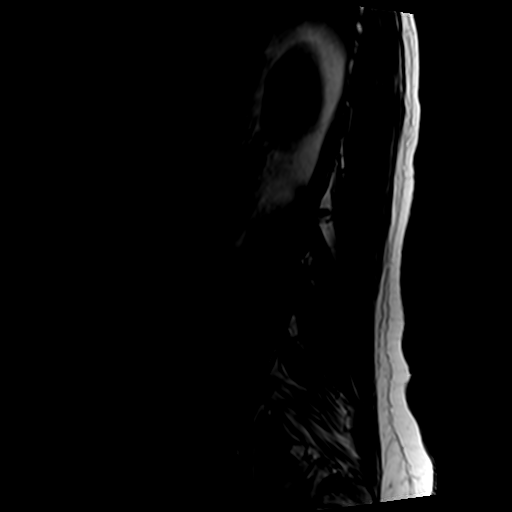

[Series 6: T2 · axial · 4.0mm · 0.70mm/px · z∈[-162,+51]mm · 9 of 41 slices shown]
[im 1/41]
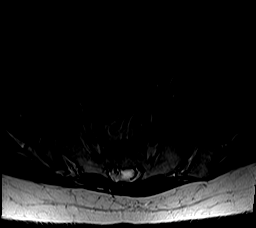
[im 6/41]
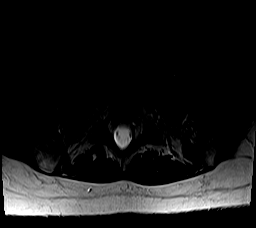
[im 12/41]
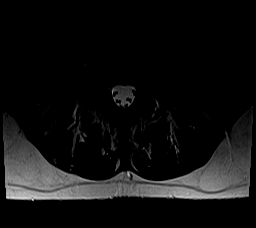
[im 18/41]
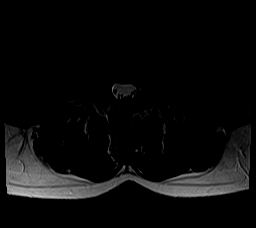
[im 21/41]
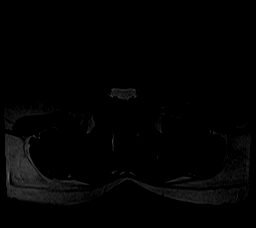
[im 23/41]
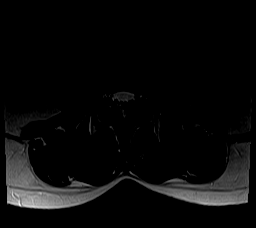
[im 29/41]
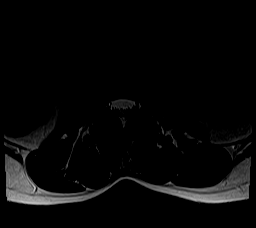
[im 35/41]
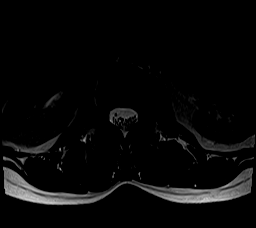
[im 41/41]
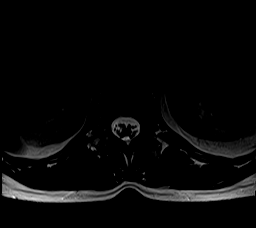

[Series 7: T1 · axial · 4.0mm · 0.35mm/px · z∈[-162,+19]mm · 5 of 41 slices shown (2 of 2)]
[im 1/41]
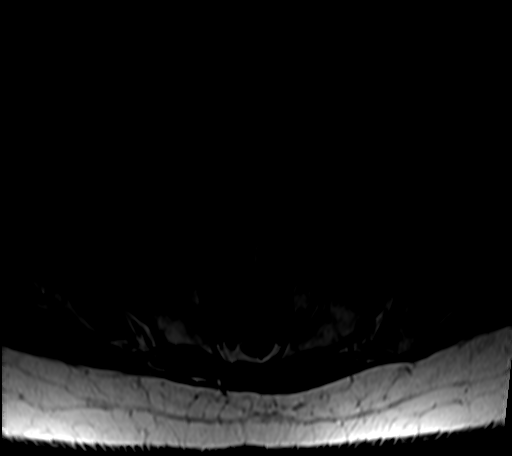
[im 6/41]
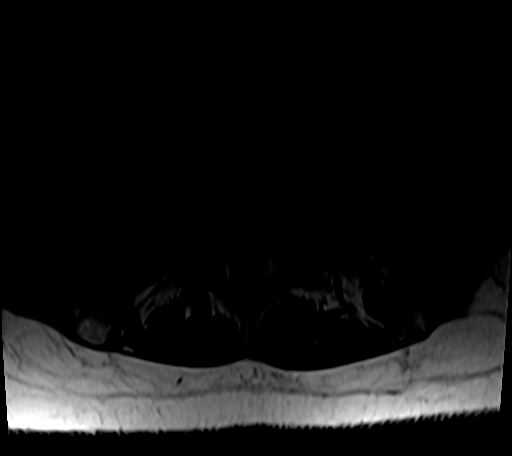
[im 12/41]
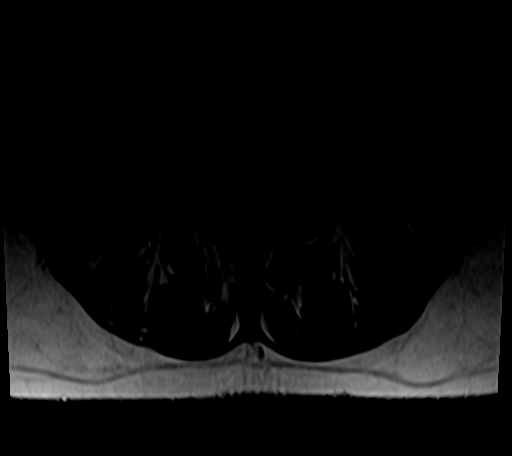
[im 21/41]
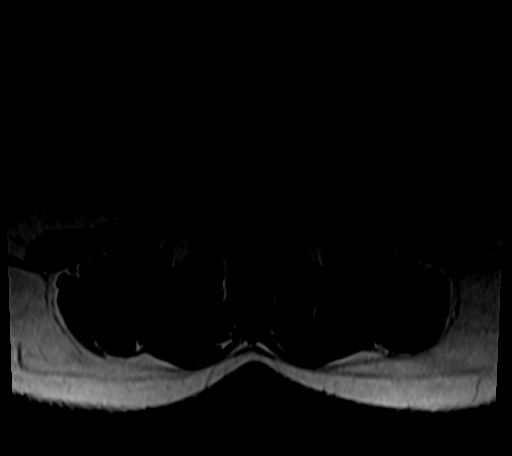
[im 35/41]
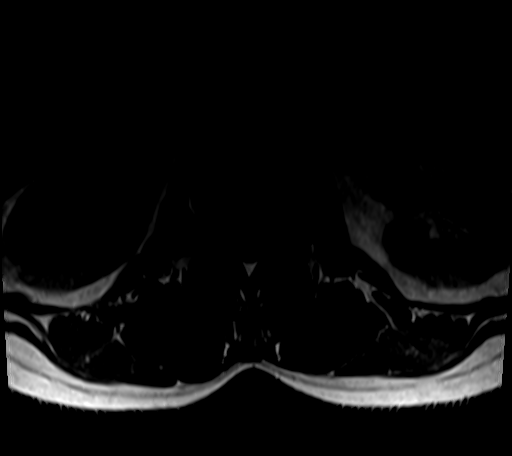

[26 of 48 positions shown; findings below may reference images not displayed]

FINDINGS: Segmentation:  5 lumbar type vertebral bodies.

Alignment:  Normal

Vertebrae:  Normal

Conus medullaris and cauda equina: Conus extends to the L1 level.
Conus and cauda equina appear normal.

Paraspinal and other soft tissues: Normal

Disc levels:

No abnormality at L3-4 or above.

L4-5: Normal appearance of the disc. Minimal facet and ligamentous
prominence. No compressive stenosis. This minimal facet arthritis
could possibly be associated with low back discomfort.

L5-S1: Mild degenerative desiccation of the disc. Minimal annular
bulging with an annular fissure in the left posterolateral corner,
both findings less noticeable than were seen in [XY]. Minimal facet
hypertrophy without edema. No canal or foraminal stenosis.
IMPRESSION: 1. No advanced finding. Mild degenerative disc disease at L5-S1 with
mild desiccation of the disc with minimal annular bulging and a
small annular fissure in the left posterolateral corner, less
noticeable than were seen in [XY].
2. Mild facet osteoarthritis at L4-5 and L5-S1 without edema. This
could possibly be associated with low back discomfort or referred
facet syndrome pain.

## 2020-11-10 ENCOUNTER — Encounter: Payer: Self-pay | Admitting: Family Medicine

## 2020-11-11 ENCOUNTER — Other Ambulatory Visit: Payer: Self-pay

## 2020-11-11 DIAGNOSIS — M5416 Radiculopathy, lumbar region: Secondary | ICD-10-CM

## 2020-11-11 MED ORDER — MONOVISC 88 MG/4ML IX SOSY: PREFILLED_SYRINGE | INTRA_ARTICULAR | 0 refills | Status: DC

## 2020-11-12 ENCOUNTER — Ambulatory Visit: Payer: BC Managed Care – PPO | Admitting: Family Medicine

## 2020-11-13 MED ORDER — MONOVISC 88 MG/4ML IX SOSY: PREFILLED_SYRINGE | INTRA_ARTICULAR | 0 refills | Status: DC

## 2020-11-13 NOTE — Addendum Note (Signed)
Addended by: Evon Slack on: 11/13/2020 08:48 AM   Modules accepted: Orders

## 2020-11-14 ENCOUNTER — Ambulatory Visit: Payer: BC Managed Care – PPO | Admitting: Family Medicine

## 2020-11-18 ENCOUNTER — Other Ambulatory Visit: Payer: BC Managed Care – PPO

## 2020-11-19 ENCOUNTER — Other Ambulatory Visit: Payer: Self-pay

## 2020-11-19 ENCOUNTER — Ambulatory Visit
Admission: RE | Admit: 2020-11-19 | Discharge: 2020-11-19 | Disposition: A | Discharge status: Home / Self Care | Payer: BC Managed Care – PPO | Source: Ambulatory Visit | Attending: Family Medicine | Admitting: Family Medicine

## 2020-11-19 DIAGNOSIS — M47816 Spondylosis without myelopathy or radiculopathy, lumbar region: Secondary | ICD-10-CM | POA: Diagnosis not present

## 2020-11-19 DIAGNOSIS — M5416 Radiculopathy, lumbar region: Secondary | ICD-10-CM

## 2020-11-19 IMAGING — XA Imaging study
4 series · 4 of 4 positions shown · non-contrast
Comparison: none

CLINICAL DATA: Low back pain radiating into the back of the right
thigh for the past 3-4 months. Mild lower lumbar spondylosis without
advanced stenosis or impingement. L5-S1 injection requested, however
due to lack of posterior epidural fat at this level, injection will
be performed at L4-L5.

[Series 1: ortho adipose · 1 of 1 slices shown (1 of 4)]
[im 1/1]
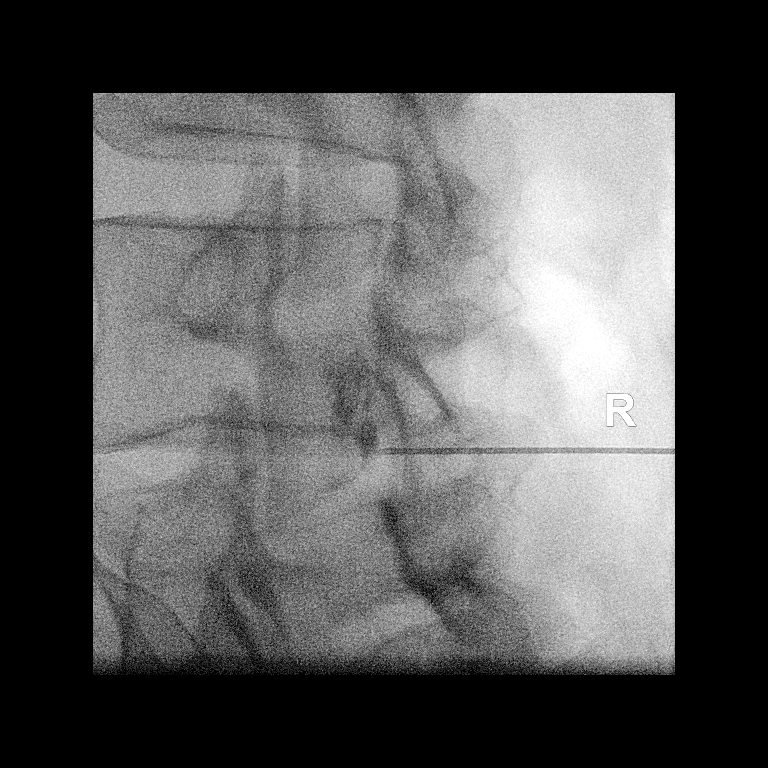

[Series 2: ortho adipose · 1 of 1 slices shown (2 of 4)]
[im 1/1]
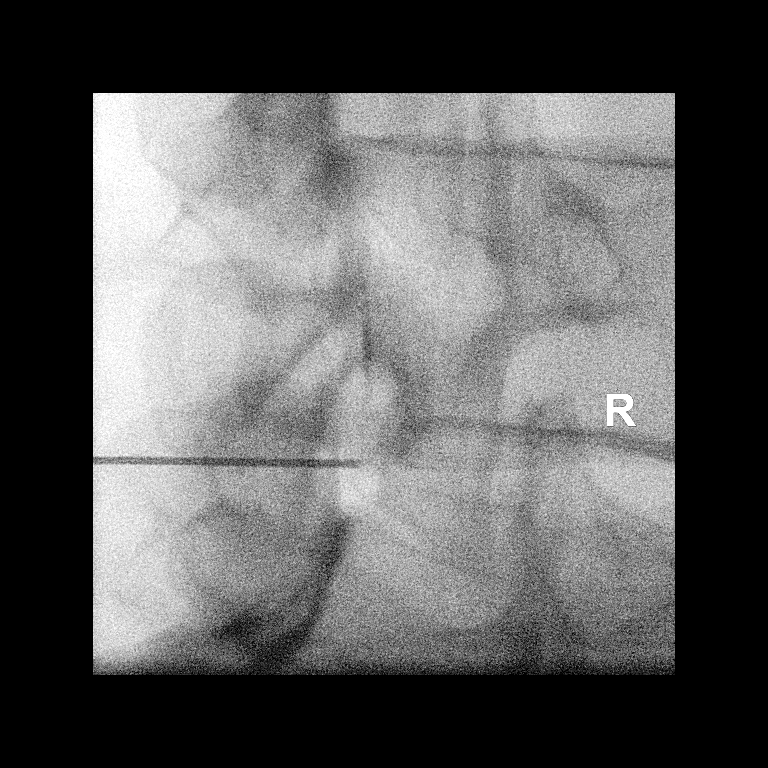

[Series 3: ortho adipose · 1 of 1 slices shown (3 of 4)]
[im 1/1]
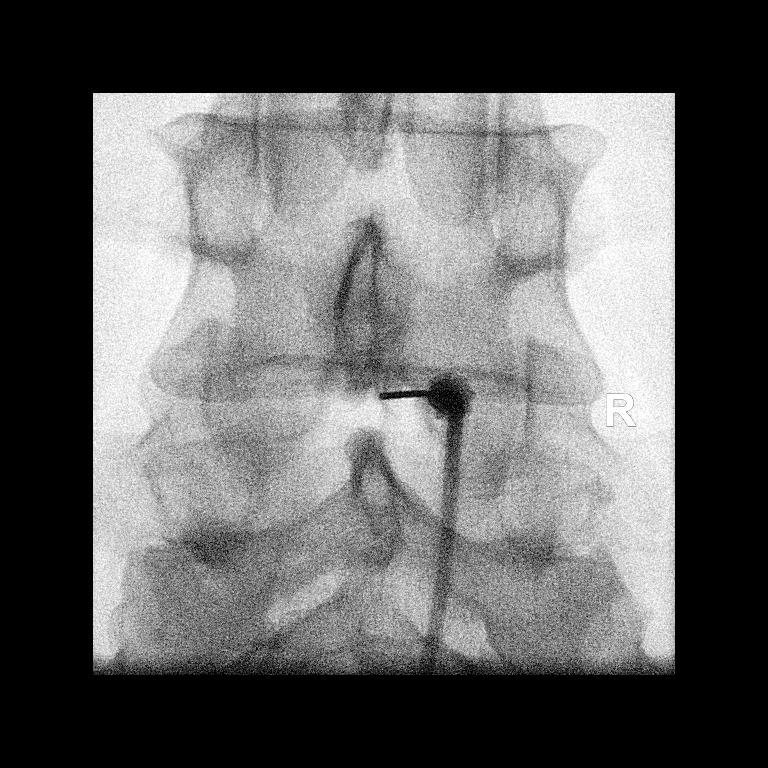

[Series 4: ortho adipose · 1 of 1 slices shown (4 of 4)]
[im 1/1]
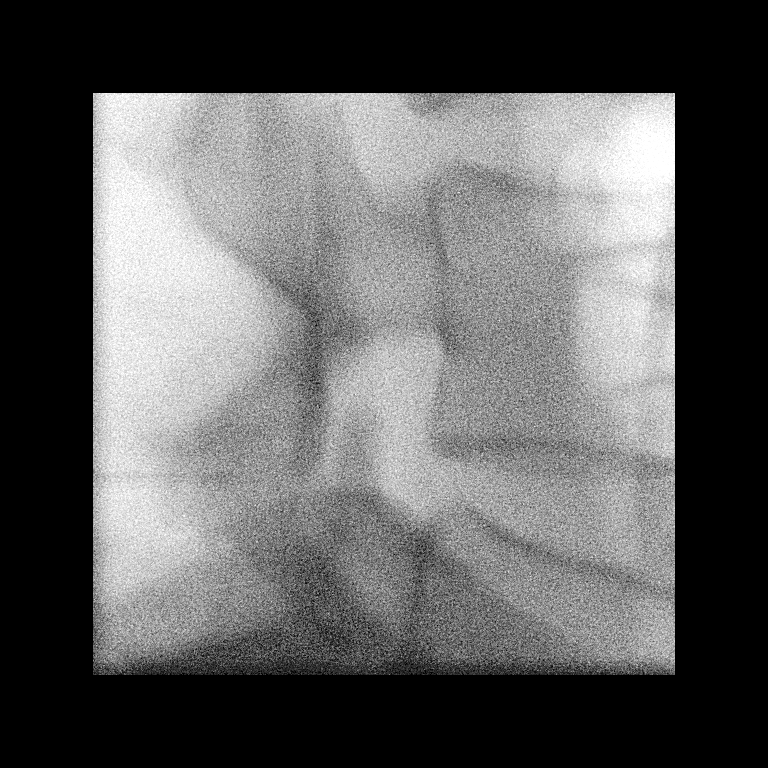

[4 of 4 positions shown; findings below may reference images not displayed]

FLUOROSCOPY TIME:  Radiation Exposure Index (as provided by the
fluoroscopic device): 2.2 mGy

Fluoroscopy Time:  18 seconds

Number of Acquired Images:  0

PROCEDURE:
The procedure, risks, benefits, and alternatives were explained to
the patient. Questions regarding the procedure were encouraged and
answered. The patient understands and consents to the procedure.

LUMBAR EPIDURAL INJECTION:

An interlaminar approach was performed on the right at L4-L5. The
overlying skin was cleansed and anesthetized. A 3.5 inch 20 gauge
epidural needle was advanced using loss-of-resistance technique.

DIAGNOSTIC EPIDURAL INJECTION:

Injection of Isovue-M 200 shows a good epidural pattern with spread
above and below the level of needle placement to both sides. No
vascular opacification is seen.

THERAPEUTIC EPIDURAL INJECTION:

120 mg of Depo-Medrol mixed with 3 mL of 1% lidocaine were
instilled. The procedure was well-tolerated, and the patient was
discharged thirty minutes following the injection in good condition.

COMPLICATIONS:
None immediate.
IMPRESSION: Technically successful interlaminar epidural injection on the right
at L4-L5.

## 2020-11-19 MED ORDER — IOPAMIDOL (ISOVUE-M 200) INJECTION 41%
1.0000 mL | Freq: Once | INTRAMUSCULAR | Status: AC
  Administered 2020-11-19: 1 mL via EPIDURAL

## 2020-11-19 MED ORDER — METHYLPREDNISOLONE ACETATE 40 MG/ML INJ SUSP (RADIOLOG
120.0000 mg | Freq: Once | INTRAMUSCULAR | Status: AC
  Administered 2020-11-19: 120 mg via EPIDURAL

## 2020-11-19 NOTE — Discharge Instructions (Signed)

## 2020-12-04 ENCOUNTER — Other Ambulatory Visit: Payer: Self-pay

## 2020-12-04 ENCOUNTER — Ambulatory Visit (INDEPENDENT_AMBULATORY_CARE_PROVIDER_SITE_OTHER): Payer: BC Managed Care – PPO | Admitting: Family Medicine

## 2020-12-04 ENCOUNTER — Encounter: Payer: Self-pay | Admitting: Family Medicine

## 2020-12-04 DIAGNOSIS — M1711 Unilateral primary osteoarthritis, right knee: Secondary | ICD-10-CM | POA: Diagnosis not present

## 2020-12-04 NOTE — Progress Notes (Signed)
[facility] [address] [place] [address] Phone: [phone] Subjective:   Stephanie Mcguire, am serving as a scribe for Dr. Antoine Mcguire. This visit occurred during the SARS-CoV-2 public health emergency.  Safety protocols were in place, including screening questions prior to the visit, additional usage of staff PPE, and extensive cleaning of exam room while observing appropriate contact time as indicated for disinfecting solutions.   I'm seeing this patient by the request  of:  Patient, No Pcp Per  CC: knee pain follow up   KCL:[id number]    Last visit 10/01/2020 Chondral thinning noted on MRI.  Patient's meniscus appear to be unremarkable.  Discussed with patient about icing regimen, home exercises, patella strapping, patient will likely be a candidate for viscosupplementation and we will see if we can get her approval.  Increase activity slowly.  Follow-up again 6 to 8 weeks  Update 12/04/2020 Stephanie Mcguire is a 49 y.o. female coming in with complaint of R knee pain. Patient has brought Monovisc in for R knee injection. Patient states that her knee has been achy and is ready for the injection.      Past Medical History:  Diagnosis Date  . Asthma   . Bipolar 2 disorder (HCC)   . Thyroid disease    Past Surgical History:  Procedure Laterality Date  . BUNIONECTOMY    . MANDIBLE SURGERY  2009  . RECONSTRUCTION OF EYELID  1989 and 1990   Social History   Socioeconomic History  . Marital status: Married    Spouse name: Not on file  . Number of children: Not on file  . Years of education: Not on file  . Highest education level: Not on file  Occupational History  . Not on file  Tobacco Use  . Smoking status: Never Smoker  . Smokeless tobacco: Never Used  Vaping Use  . Vaping Use: Never used  Substance and Sexual Activity  . Alcohol use: Yes    Alcohol/week: 2.0 standard drinks    Types: 2 Glasses of wine per week  . Drug  use: No  . Sexual activity: Yes    Birth control/protection: Pill  Other Topics Concern  . Not on file  Social History Narrative  . Not on file   Social Determinants of Health   Financial Resource Strain:   . Difficulty of Paying Living Expenses: Not on file  Food Insecurity:   . Worried About Programme researcher, broadcasting/film/video in the Last Year: Not on file  . Ran Out of Food in the Last Year: Not on file  Transportation Needs:   . Lack of Transportation (Medical): Not on file  . Lack of Transportation (Non-Medical): Not on file  Physical Activity:   . Days of Exercise per Week: Not on file  . Minutes of Exercise per Session: Not on file  Stress:   . Feeling of Stress : Not on file  Social Connections:   . Frequency of Communication with Friends and Family: Not on file  . Frequency of Social Gatherings with Friends and Family: Not on file  . Attends Religious Services: Not on file  . Active Member of Clubs or Organizations: Not on file  . Attends Banker Meetings: Not on file  . Marital Status: Not on file   No Known Allergies Family History  Problem Relation Age of Onset  . Parkinson's disease Mother   . Hypertension Father     Current Outpatient Medications (  Endocrine & Metabolic):  .  levothyroxine (SYNTHROID, LEVOTHROID) 100 MCG tablet, Take 100 mcg by mouth daily before breakfast. .  norethindrone-ethinyl estradiol (LOESTRIN) 1-20 MG-MCG tablet, TAKE 1 TABLET DAILY   Current Outpatient Medications (Respiratory):  .  cetirizine-pseudoephedrine (ZYRTEC-D) 5-120 MG tablet, Take 1 tablet by mouth 2 (two) times daily as needed for allergies. .  fluticasone (FLONASE) 50 MCG/ACT nasal spray, Place 2 sprays into both nostrils daily.    Current Outpatient Medications (Other):  Marland Kitchen  buPROPion (WELLBUTRIN XL) 150 MG 24 hr tablet, Take 150 mg by mouth daily. .  clonazePAM (KLONOPIN) 0.5 MG tablet, Take 0.5 mg by mouth daily. .  divalproex (DEPAKOTE ER) 500 MG 24 hr tablet,   .  Hyaluronan (MONOVISC) 88 MG/4ML SOSY, Patient will bring to clinic where injection will be preformed   Reviewed prior external information including notes and imaging from  primary care provider As well as notes that were available from care everywhere and other healthcare systems.  Past medical history, social, surgical and family history all reviewed in electronic medical record.  No pertanent information unless stated regarding to the chief complaint.   Review of Systems:  No headache, visual changes, nausea, vomiting, diarrhea, constipation, dizziness, abdominal pain, skin rash, fevers, chills, night sweats, weight loss, swollen lymph nodes, body aches, joint swelling, chest pain, shortness of breath, mood changes. POSITIVE muscle aches  Objective  Blood pressure 112/70, pulse 94, height 5\' 5"  (1.651 m), weight 156 lb (70.8 kg), SpO2 98 %.   General: No apparent distress alert and oriented x3 mood and affect normal, dressed appropriately.  HEENT: Pupils equal, extraocular movements intact  Respiratory: Patient's speak in full sentences and does not appear short of breath  Cardiovascular: No lower extremity edema, non tender, no erythema  Antalgic gait   Knee:right  Mild valgus deformity noted. Large thigh to calf ratio.  Tender to palpation over PF joint line.  ROM full in flexion and extension and lower leg rotation.  painful patellar compression. Patellar glide with moderate crepitus. Patellar and quadriceps tendons unremarkable. Hamstring and quadriceps strength is normal.  After informed written and verbal consent, patient was seated on exam table. Right knee was prepped with alcohol swab and utilizing anterolateral approach, patient's right knee space was injected with 48 mg per 3 mL of Monovisc (sodium hyaluronate) in a prefilled syringe was injected easily into the knee through a 22-gauge needle..Patient tolerated the procedure well without immediate complications.    Patient supplied her own viscosupplementation Impression and Recommendations:     The above documentation has been reviewed and is accurate and complete , DO

## 2020-12-04 NOTE — Patient Instructions (Addendum)
Good to see you  Ice is yoru friend Will take some time for this to work and will be full for 1-2 days Call us if anything such as swelling or redness.  See me again in 6 weeks to make sure doing better   See me again in

## 2020-12-04 NOTE — Assessment & Plan Note (Signed)
Patient given viscosupplementation.  Discussed with patient at great length about icing regimen and home exercise, which activities to do which wants to avoid.  Patient is to increase activity slowly.  Patient warned of any type of the different side effects.  Follow-up again in 6 to 8 weeks for this chronic problem with exacerbation

## 2020-12-16 NOTE — Progress Notes (Signed)
[name] [address] [place] [address] Phone: [phone] Subjective:   I Stephanie Mcguire am serving as a Neurosurgeon for Dr. Antoine Primas.  This visit occurred during the SARS-CoV-2 public health emergency.  Safety protocols were in place, including screening questions prior to the visit, additional usage of staff PPE, and extensive cleaning of exam room while observing appropriate contact time as indicated for disinfecting solutions.   I'm seeing this patient by the request  of:  Patient, No Pcp Per  CC: Low back and knee pain follow-up  [id number]   12/04/2020 Patient given viscosupplementation.  Discussed with patient at great length about icing regimen and home exercise, which activities to do which wants to avoid.  Patient is to increase activity slowly.  Patient warned of any type of the different side effects.  Follow-up again in 6 to 8 weeks for this chronic problem with exacerbation   Update 12/17/2020 Stephanie Mcguire is a 49 y.o. female coming in with complaint of right knee and back pain. Epidural on 11/19/2020. Patient states her back pain is a lot better. Still some knee pain but getting better.  Patient did have viscosupplementation at last exam 3 weeks ago.  Would state that she is probably about 45 to 55% better.  O     Past Medical History:  Diagnosis Date  . Asthma   . Bipolar 2 disorder (HCC)   . Thyroid disease    Past Surgical History:  Procedure Laterality Date  . BUNIONECTOMY    . MANDIBLE SURGERY  2009  . RECONSTRUCTION OF EYELID  1989 and 1990   Social History   Socioeconomic History  . Marital status: Married    Spouse name: Not on file  . Number of children: Not on file  . Years of education: Not on file  . Highest education level: Not on file  Occupational History  . Not on file  Tobacco Use  . Smoking status: Never Smoker  . Smokeless tobacco: Never Used  Vaping Use  . Vaping Use: Never used   Substance and Sexual Activity  . Alcohol use: Yes    Alcohol/week: 2.0 standard drinks    Types: 2 Glasses of wine per week  . Drug use: No  . Sexual activity: Yes    Birth control/protection: Pill  Other Topics Concern  . Not on file  Social History Narrative  . Not on file   Social Determinants of Health   Financial Resource Strain: Not on file  Food Insecurity: Not on file  Transportation Needs: Not on file  Physical Activity: Not on file  Stress: Not on file  Social Connections: Not on file   No Known Allergies Family History  Problem Relation Age of Onset  . Parkinson's disease Mother   . Hypertension Father     Current Outpatient Medications (Endocrine & Metabolic):  .  levothyroxine (SYNTHROID, LEVOTHROID) 100 MCG tablet, Take 100 mcg by mouth daily before breakfast. .  norethindrone-ethinyl estradiol (LOESTRIN) 1-20 MG-MCG tablet, TAKE 1 TABLET DAILY   Current Outpatient Medications (Respiratory):  .  cetirizine-pseudoephedrine (ZYRTEC-D) 5-120 MG tablet, Take 1 tablet by mouth 2 (two) times daily as needed for allergies. .  fluticasone (FLONASE) 50 MCG/ACT nasal spray, Place 2 sprays into both nostrils daily.    Current Outpatient Medications (Other):  Marland Kitchen  buPROPion (WELLBUTRIN XL) 150 MG 24 hr tablet, Take 150 mg by mouth daily. .  clonazePAM (KLONOPIN) 0.5 MG tablet, Take 0.5 mg  by mouth daily. .  divalproex (DEPAKOTE ER) 500 MG 24 hr tablet,  .  Hyaluronan (MONOVISC) 88 MG/4ML SOSY, Patient will bring to clinic where injection will be preformed   Reviewed prior external information including notes and imaging from  primary care provider As well as notes that were available from care everywhere and other healthcare systems.  Past medical history, social, surgical and family history all reviewed in electronic medical record.  No pertanent information unless stated regarding to the chief complaint.   Review of Systems:  No headache, visual changes,  nausea, vomiting, diarrhea, constipation, dizziness, abdominal pain, skin rash, fevers, chills, night sweats, weight loss, swollen lymph nodes, body aches, joint swelling, chest pain, shortness of breath, mood changes. POSITIVE muscle aches  Objective  Blood pressure 120/82, pulse 96, height 5\' 5"  (1.651 m), weight 157 lb (71.2 kg), SpO2 98 %.   General: No apparent distress alert and oriented x3 mood and affect normal, dressed appropriately.  HEENT: Pupils equal, extraocular movements intact  Respiratory: Patient's speak in full sentences and does not appear short of breath  Cardiovascular: No lower extremity edema, non tender, no erythema  Patient back exam very mild loss of lordosis, negative straight leg testing.  Patient does have 5 out of 5 strength in lower extremities. Knee exam does have some mild lateral tracking of the patella noted.  Patient does have mild positive patellar grind.    Impression and Recommendations:     The above documentation has been reviewed and is accurate and complete , DO

## 2020-12-17 ENCOUNTER — Encounter: Payer: Self-pay | Admitting: Family Medicine

## 2020-12-17 ENCOUNTER — Ambulatory Visit (INDEPENDENT_AMBULATORY_CARE_PROVIDER_SITE_OTHER): Payer: BC Managed Care – PPO | Admitting: Family Medicine

## 2020-12-17 ENCOUNTER — Other Ambulatory Visit: Payer: Self-pay

## 2020-12-17 DIAGNOSIS — M5416 Radiculopathy, lumbar region: Secondary | ICD-10-CM

## 2020-12-17 DIAGNOSIS — M1711 Unilateral primary osteoarthritis, right knee: Secondary | ICD-10-CM | POA: Diagnosis not present

## 2020-12-17 NOTE — Patient Instructions (Addendum)
Good to see you Both doing much better Ok to work out [employer] or machine weights See me again in 6-8 weeks

## 2020-12-17 NOTE — Assessment & Plan Note (Signed)
Patient is responding very well to the epidural.  Patient had this on November 19, 2020.  Doing much better.  Starting to increase activity as tolerated.  Follow-up with me again 2 months

## 2020-12-17 NOTE — Assessment & Plan Note (Signed)
Patient is approximately 50% better now 2 weeks afterwards.  Discussed with patient about icing regimen and home exercises.  Discussed avoiding certain activities.  Patient will increase activity slowly.  Follow-up again in 2 months.  Hopefully though that this will continue to improve patient given a mild return to walking and increasing progression.

## 2020-12-25 ENCOUNTER — Emergency Department: Payer: BC Managed Care – PPO

## 2020-12-25 ENCOUNTER — Encounter: Payer: Self-pay | Admitting: Emergency Medicine

## 2020-12-25 ENCOUNTER — Other Ambulatory Visit: Payer: Self-pay

## 2020-12-25 ENCOUNTER — Emergency Department
Admission: EM | Admit: 2020-12-25 | Discharge: 2020-12-25 | Disposition: A | Discharge status: Home / Self Care | Payer: BC Managed Care – PPO | Attending: Emergency Medicine | Admitting: Emergency Medicine

## 2020-12-25 DIAGNOSIS — R079 Chest pain, unspecified: Secondary | ICD-10-CM | POA: Insufficient documentation

## 2020-12-25 DIAGNOSIS — R197 Diarrhea, unspecified: Secondary | ICD-10-CM | POA: Diagnosis not present

## 2020-12-25 DIAGNOSIS — R059 Cough, unspecified: Secondary | ICD-10-CM | POA: Diagnosis not present

## 2020-12-25 DIAGNOSIS — E039 Hypothyroidism, unspecified: Secondary | ICD-10-CM | POA: Insufficient documentation

## 2020-12-25 DIAGNOSIS — R509 Fever, unspecified: Secondary | ICD-10-CM | POA: Diagnosis not present

## 2020-12-25 DIAGNOSIS — J439 Emphysema, unspecified: Secondary | ICD-10-CM | POA: Diagnosis not present

## 2020-12-25 DIAGNOSIS — E860 Dehydration: Secondary | ICD-10-CM

## 2020-12-25 DIAGNOSIS — J45909 Unspecified asthma, uncomplicated: Secondary | ICD-10-CM | POA: Insufficient documentation

## 2020-12-25 DIAGNOSIS — J029 Acute pharyngitis, unspecified: Secondary | ICD-10-CM | POA: Diagnosis not present

## 2020-12-25 DIAGNOSIS — Z79899 Other long term (current) drug therapy: Secondary | ICD-10-CM | POA: Insufficient documentation

## 2020-12-25 DIAGNOSIS — U071 COVID-19: Secondary | ICD-10-CM | POA: Insufficient documentation

## 2020-12-25 DIAGNOSIS — R000 Tachycardia, unspecified: Secondary | ICD-10-CM | POA: Diagnosis not present

## 2020-12-25 LAB — CBC
HCT: 43.2 % (ref 36.0–46.0)
Hemoglobin: 14.7 g/dL (ref 12.0–15.0)
MCH: 31.8 pg (ref 26.0–34.0)
MCHC: 34 g/dL (ref 30.0–36.0)
MCV: 93.5 fL (ref 80.0–100.0)
Platelets: 268 10*3/uL (ref 150–400)
RBC: 4.62 MIL/uL (ref 3.87–5.11)
RDW: 13.2 % (ref 11.5–15.5)
WBC: 6.4 10*3/uL (ref 4.0–10.5)
nRBC: 0 % (ref 0.0–0.2)

## 2020-12-25 LAB — POC SARS CORONAVIRUS 2 AG: SARS Coronavirus 2 Ag: NEGATIVE

## 2020-12-25 LAB — BASIC METABOLIC PANEL
Anion gap: 9 (ref 5–15)
BUN: 7 mg/dL (ref 6–20)
CO2: 27 mmol/L (ref 22–32)
Calcium: 9.1 mg/dL (ref 8.9–10.3)
Chloride: 97 mmol/L — ABNORMAL LOW (ref 98–111)
Creatinine, Ser: 0.83 mg/dL (ref 0.44–1.00)
GFR, Estimated: >60 mL/min (ref >60)
Glucose, Bld: 91 mg/dL (ref 70–99)
Potassium: 3.7 mmol/L (ref 3.5–5.1)
Sodium: 133 mmol/L — ABNORMAL LOW (ref 135–145)

## 2020-12-25 LAB — PROCALCITONIN: Procalcitonin: <0.1 ng/mL

## 2020-12-25 LAB — RESP PANEL BY RT-PCR (FLU A&B, COVID) ARPGX2
Influenza A by PCR: NEGATIVE
Influenza B by PCR: NEGATIVE
SARS Coronavirus 2 by RT PCR: POSITIVE — AB

## 2020-12-25 LAB — POC URINE PREG, ED: Preg Test, Ur: NEGATIVE

## 2020-12-25 LAB — BRAIN NATRIURETIC PEPTIDE: B Natriuretic Peptide: 25.6 pg/mL (ref 0.0–100.0)

## 2020-12-25 LAB — TROPONIN I (HIGH SENSITIVITY): Troponin I (High Sensitivity): 6 ng/L (ref <18)

## 2020-12-25 IMAGING — CR DG CHEST 2V
1 series · 2 of 2 positions shown · non-contrast
Comparison: [DATE]

CLINICAL DATA: Shortness of breath and chest pain. COVID exposure.
Cough, fever, sore throat, and body aches.

EXAM:
CHEST - 2 VIEW

[Series 1: w chest pa · 0.14mm/px · 2 of 2 slices shown]
[im 1/2]
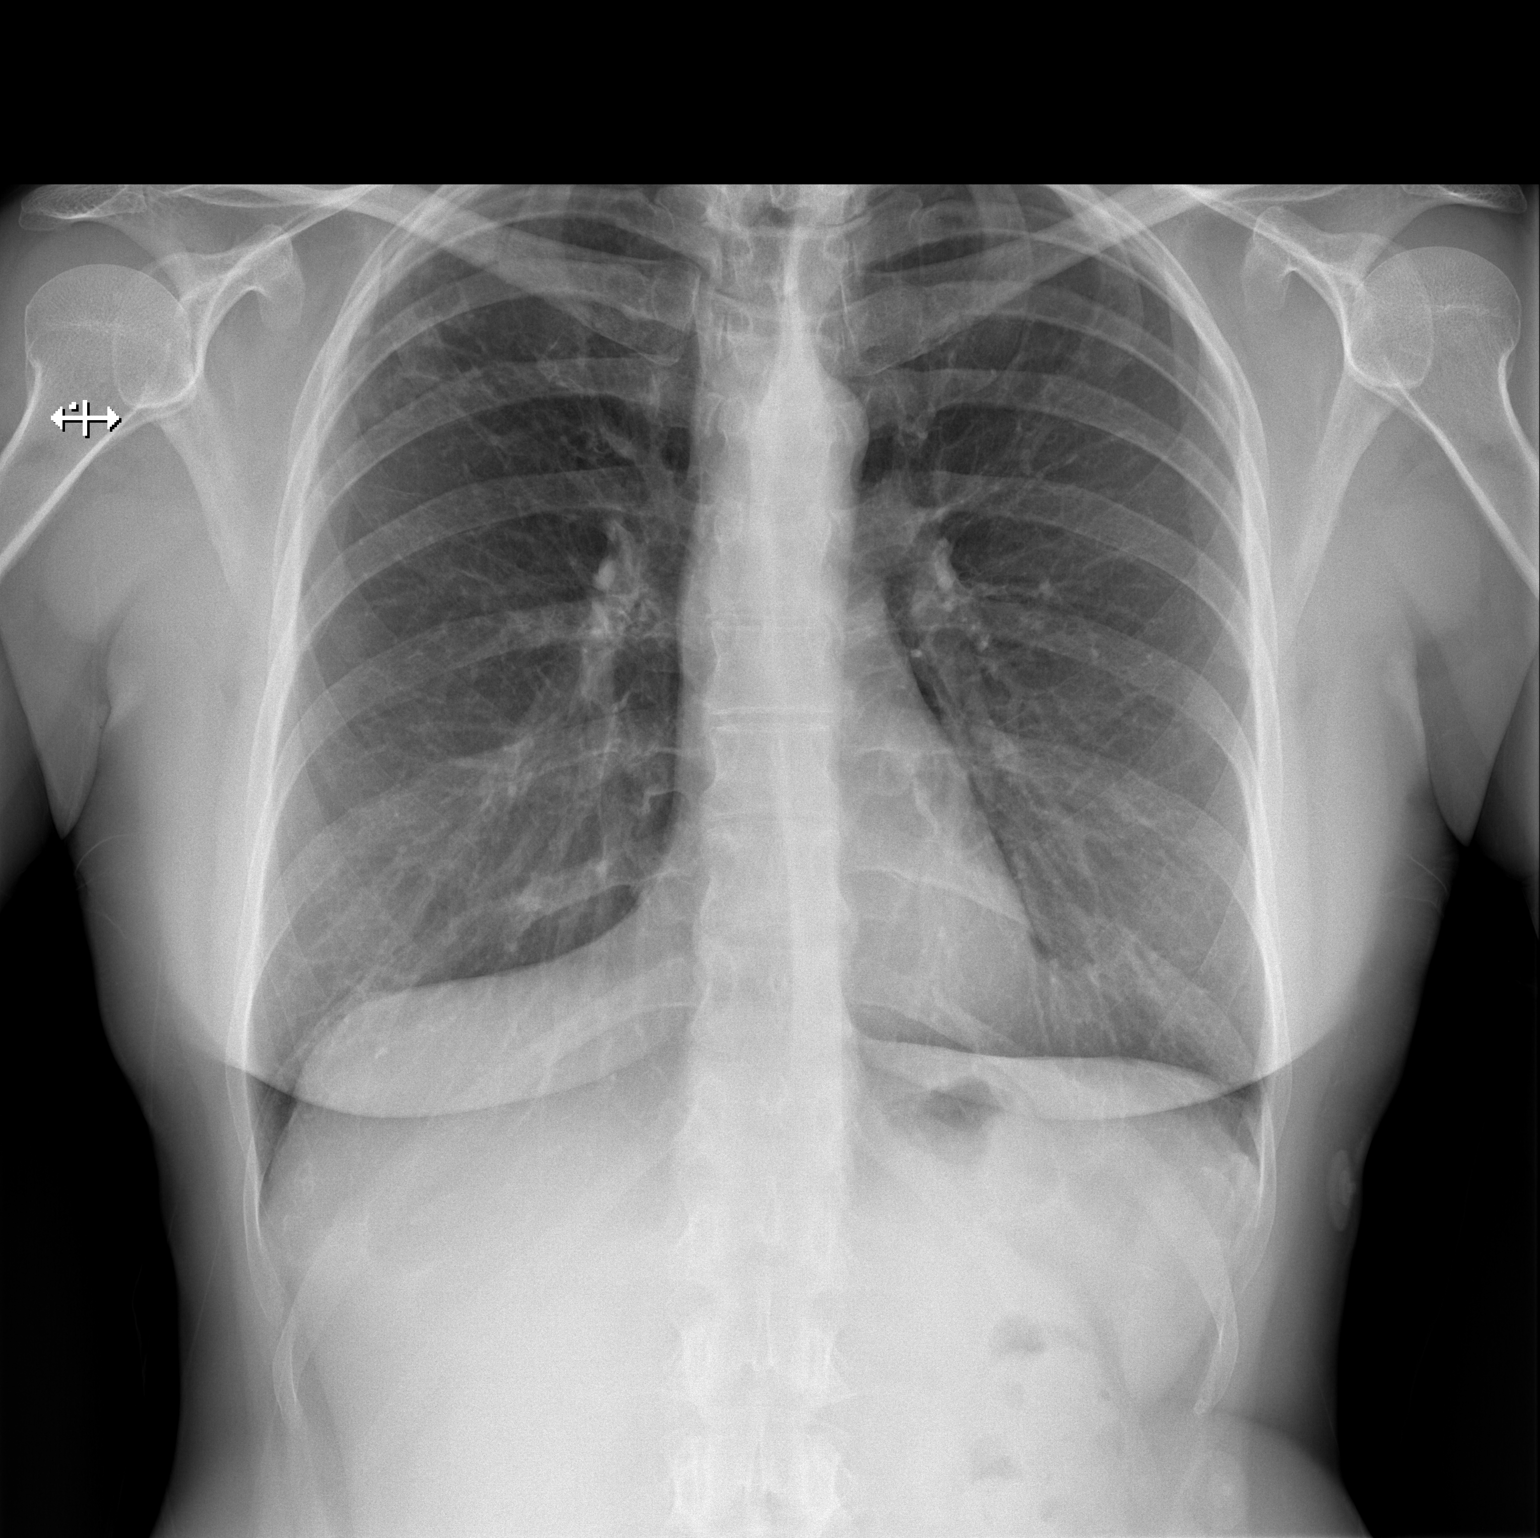
[im 2/2]
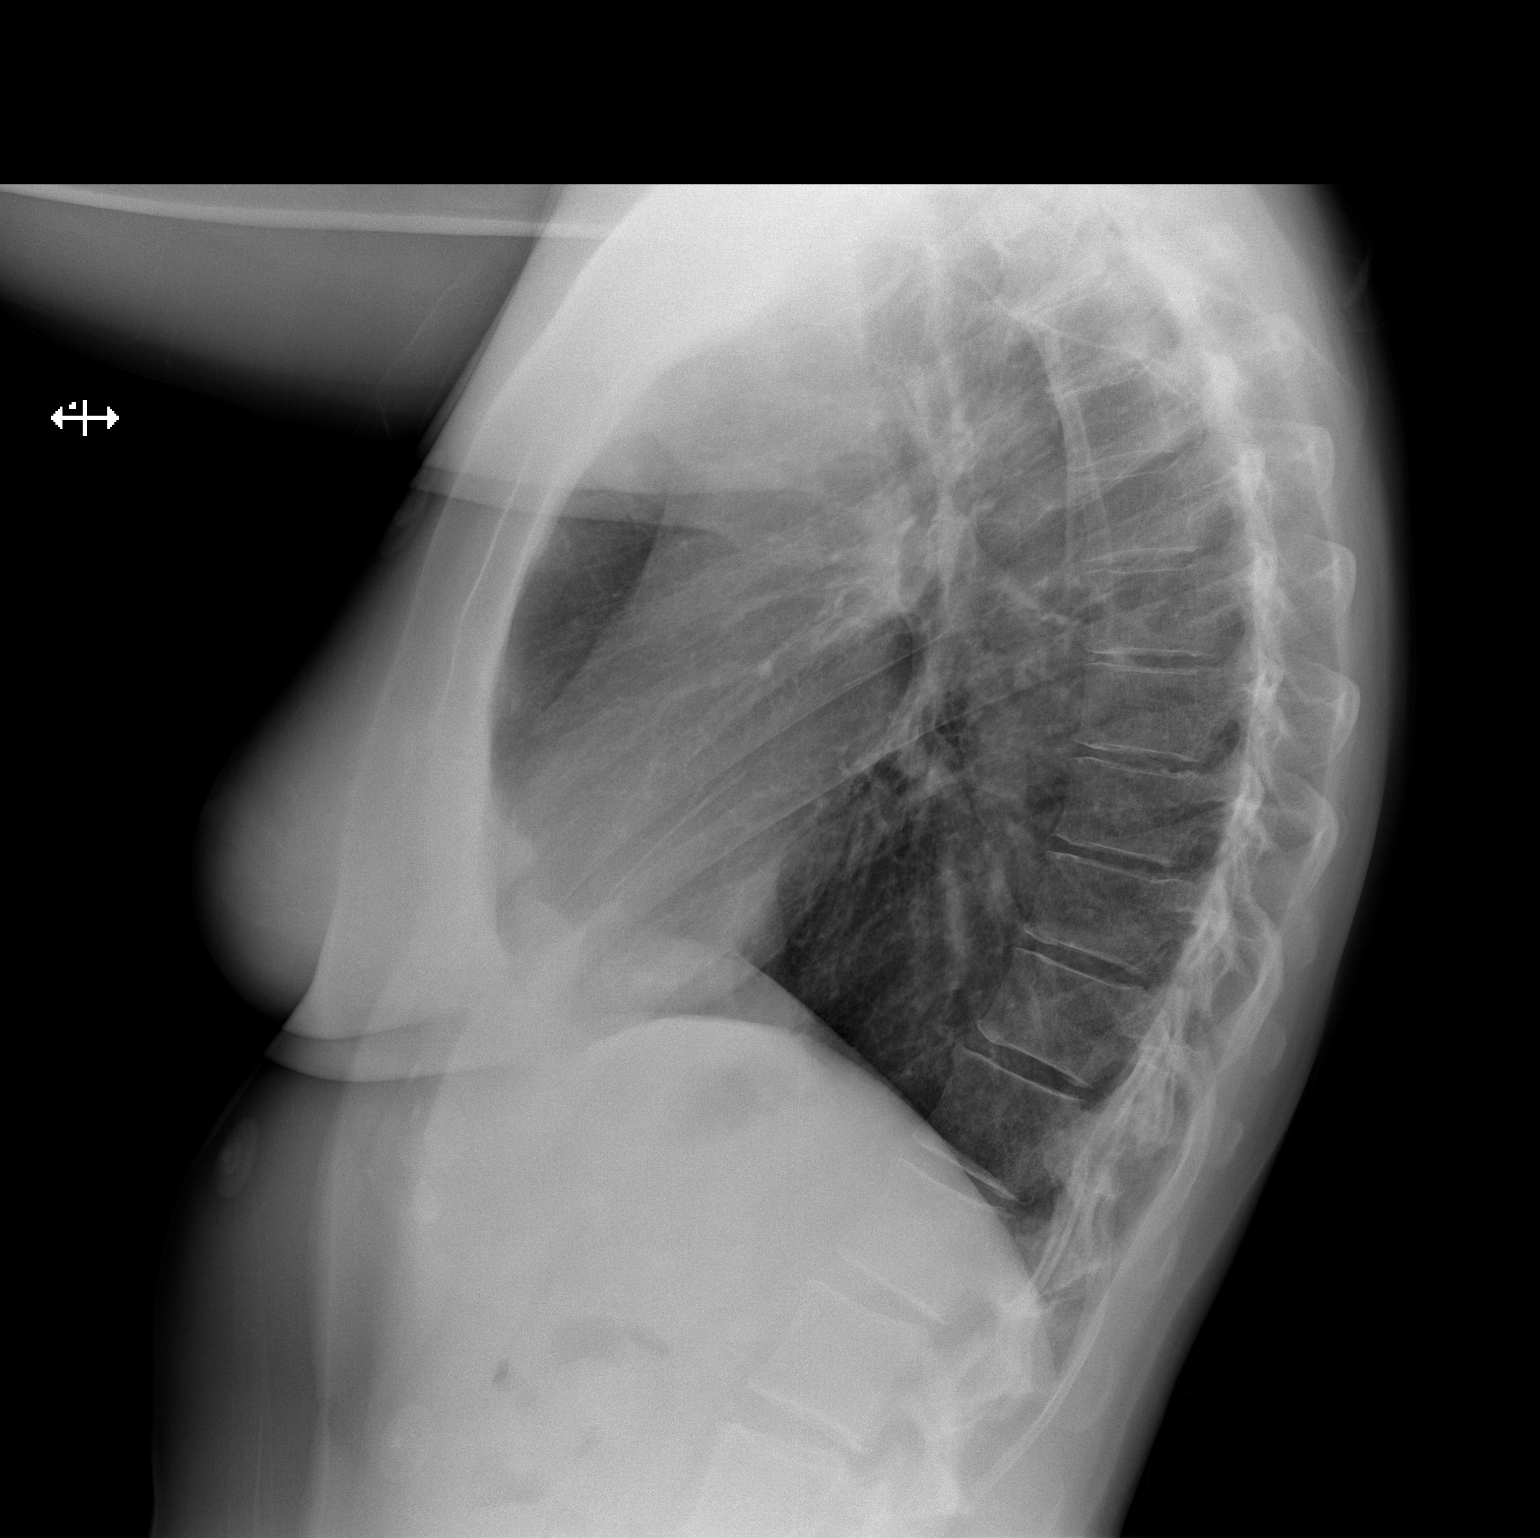

[2 of 2 positions shown; findings below may reference images not displayed]

FINDINGS: Normal heart size and pulmonary vascularity. Emphysematous changes
and fibrosis in the upper lungs. No airspace disease or
consolidation. No pleural effusions. No pneumothorax. Mediastinal
contours appear intact.
IMPRESSION: Emphysematous changes and fibrosis in the upper lungs. No evidence
of active pulmonary disease.

## 2020-12-25 IMAGING — CT CT ANGIO CHEST
2 of 7 series · 19 of 46 positions shown · IV contrast (APPLIED)
Comparison: Chest x-ray [DATE], [DATE], [DATE]

CLINICAL DATA: Chest pain fever cough COVID

EXAM:
CT ANGIOGRAPHY CHEST WITH CONTRAST
TECHNIQUE: Multidetector CT imaging of the chest was performed using the
standard protocol during bolus administration of intravenous
contrast. Multiplanar CT image reconstructions and MIPs were
obtained to evaluate the vascular anatomy.
CONTRAST:  75mL OMNIPAQUE IOHEXOL 350 MG/ML SOLN

[Series 5: thins · axial · 0.64mm/px · z∈[-319,-55]mm · 16 of 298 slices shown]
[im 17/298  lung]
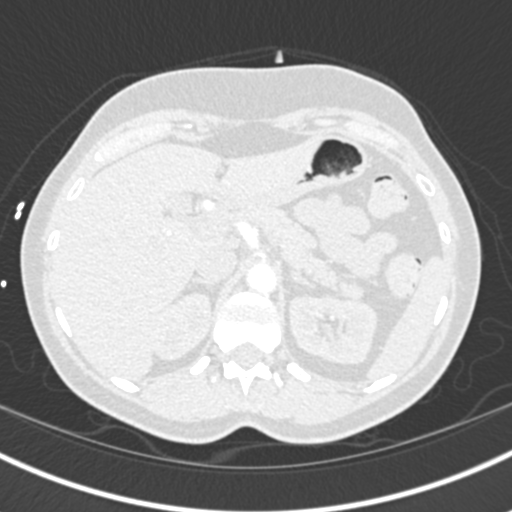
[im 34/298  soft-tissue]
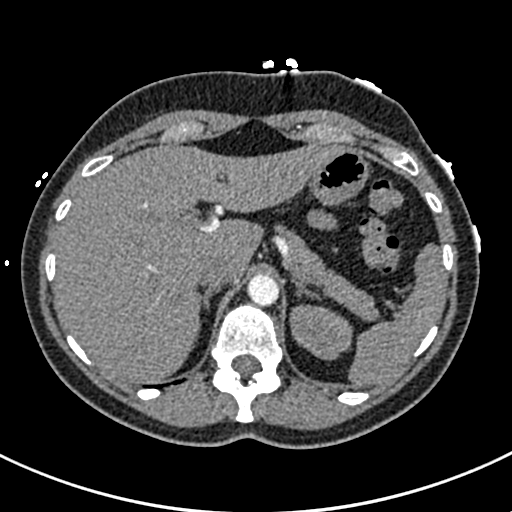
[im 50/298  lung]
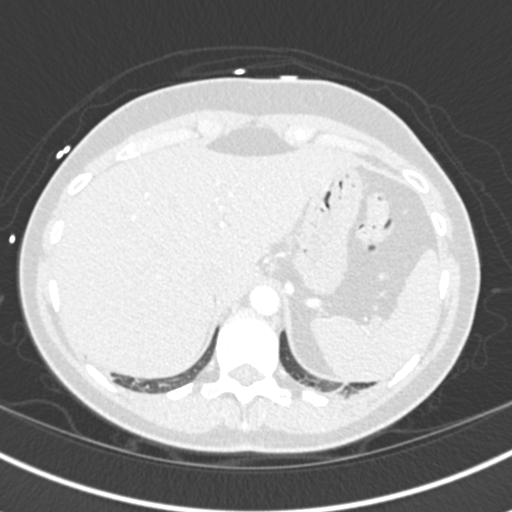
[im 67/298  soft-tissue]
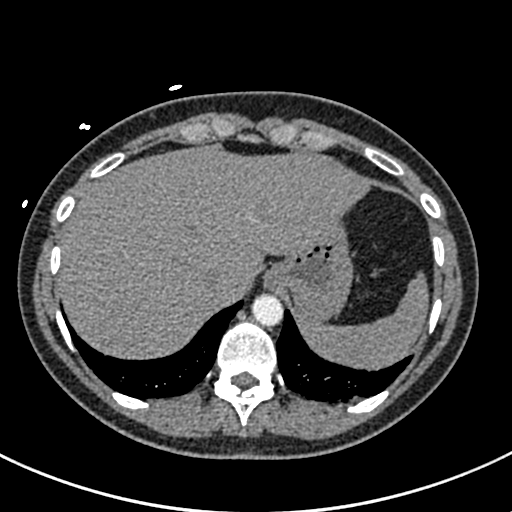
[im 83/298  lung]
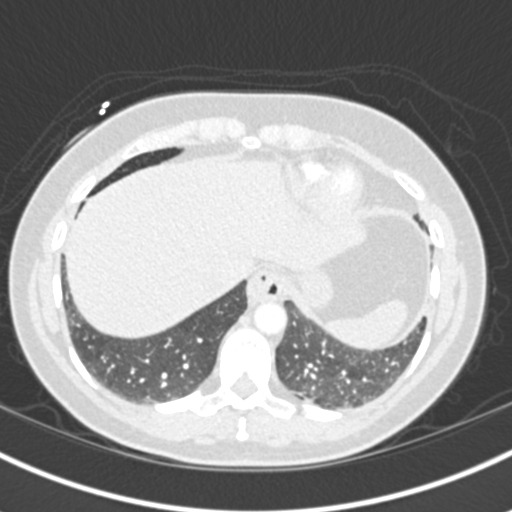
[im 100/298  soft-tissue]
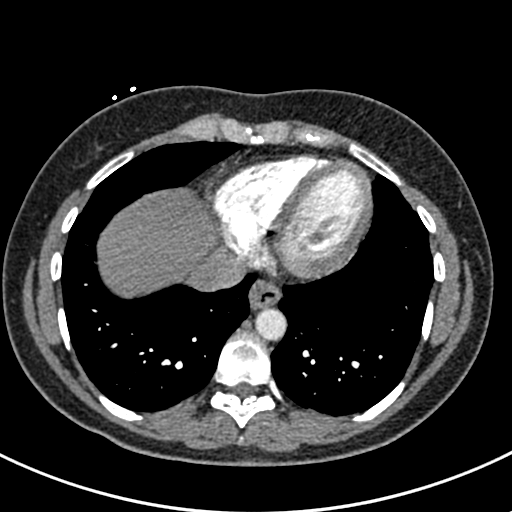
[im 116/298  lung]
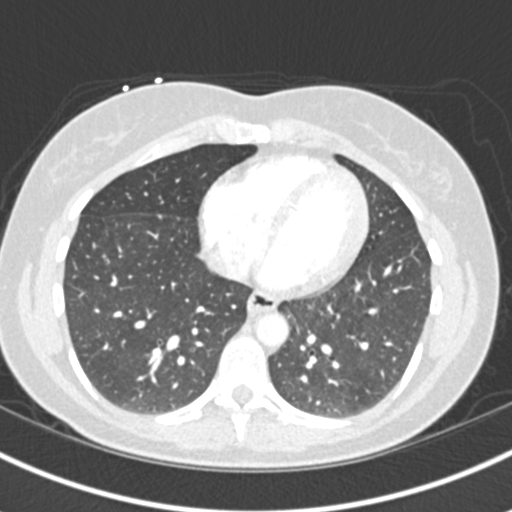
[im 133/298  soft-tissue]
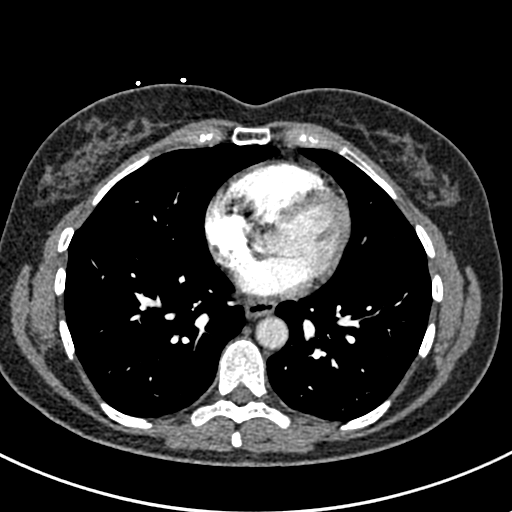
[im 166/298  lung]
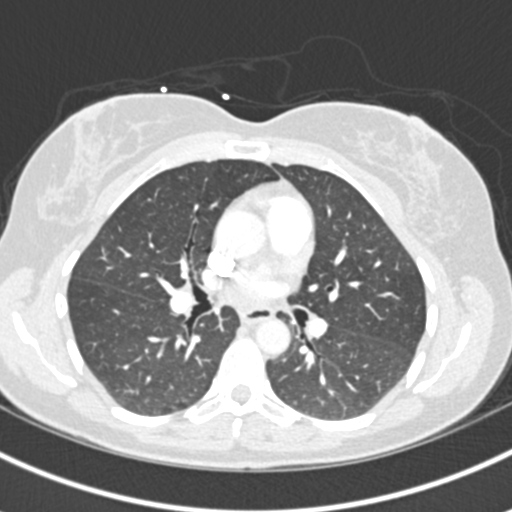
[im 182/298  soft-tissue]
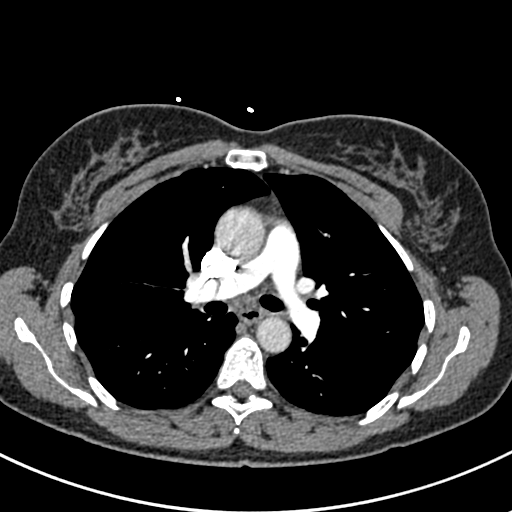
[im 199/298  lung]
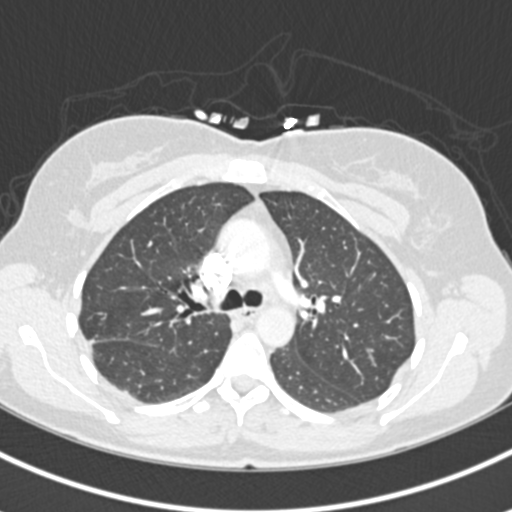
[im 215/298  soft-tissue]
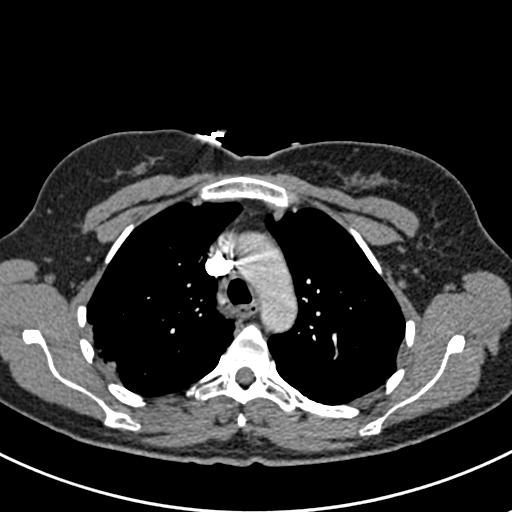
[im 232/298  lung]
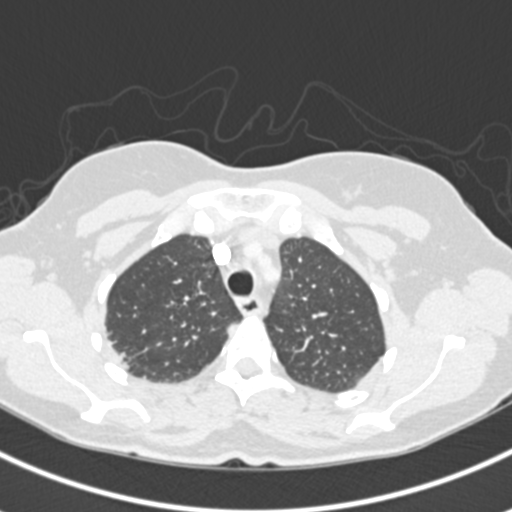
[im 248/298  soft-tissue]
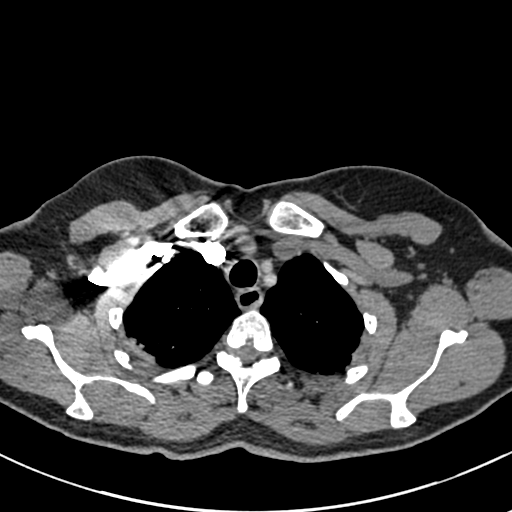
[im 265/298  lung]
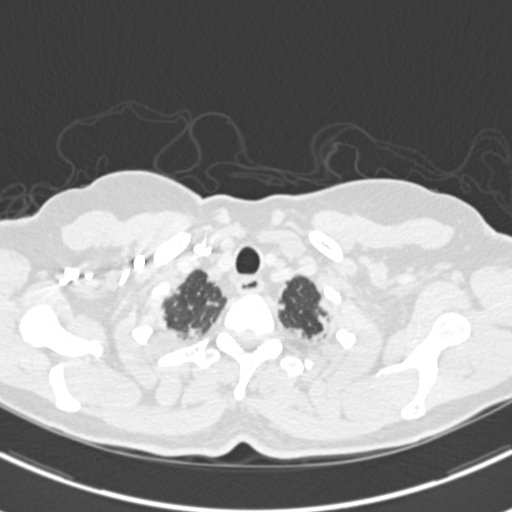
[im 281/298  soft-tissue]
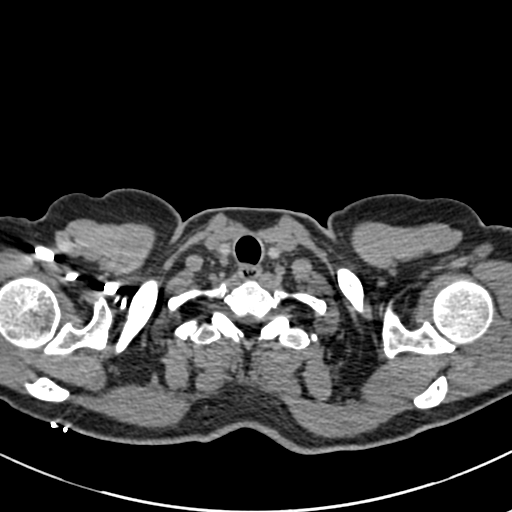

[Series 7: coronal mpr · coronal · 0.58mm/px · 3 of 84 slices shown]
[im 21/84  soft-tissue]
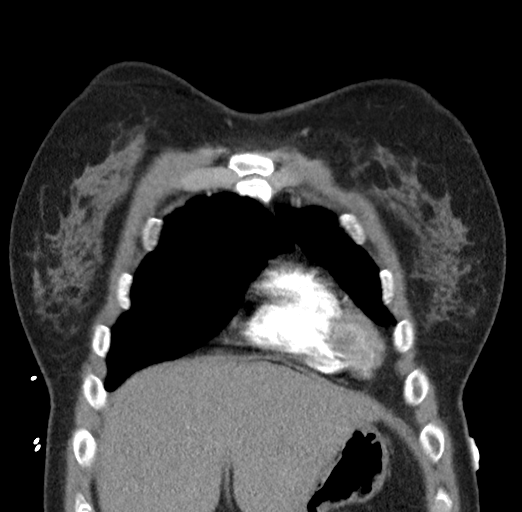
[im 42/84  soft-tissue]
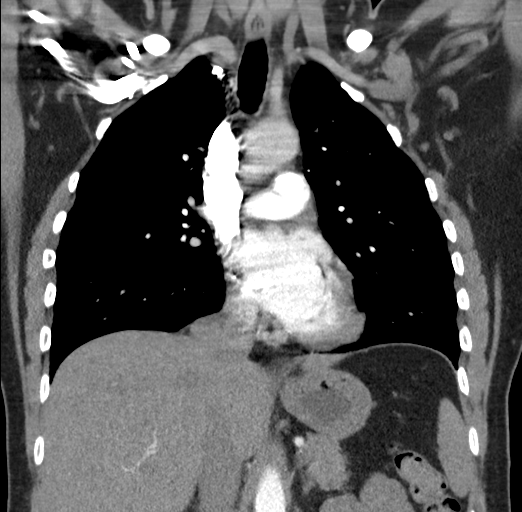
[im 63/84  soft-tissue]
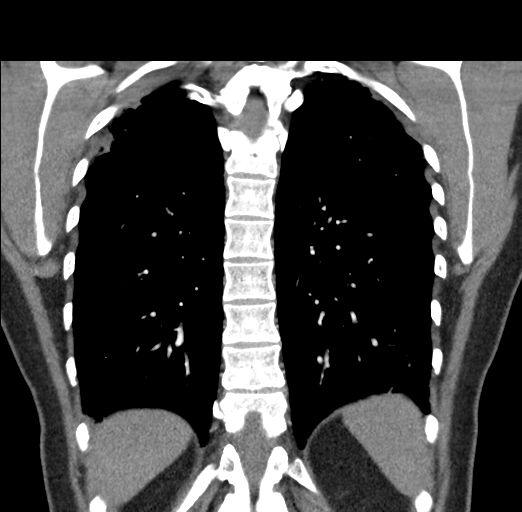

[19 of 46 positions shown; findings below may reference images not displayed]

FINDINGS: Cardiovascular: Satisfactory opacification of the pulmonary arteries
to the segmental level. No evidence of pulmonary embolism.
Nonaneurysmal aorta. Normal cardiac size. No pericardial effusion

Mediastinum/Nodes: No enlarged mediastinal, hilar, or axillary lymph
nodes. Thyroid gland, trachea, and esophagus demonstrate no
significant findings.

Lungs/Pleura: Right greater than left apical pleural and parenchymal
scarring. No acute consolidation, pleural effusion or pneumothorax.

Upper Abdomen: No acute abnormality.

Musculoskeletal: No chest wall abnormality. No acute or significant
osseous findings.

Review of the MIP images confirms the above findings.
IMPRESSION: 1. Negative for acute pulmonary embolus.
2. Right greater than left apical pleural and parenchymal scarring.
No acute airspace disease.

## 2020-12-25 MED ORDER — LACTATED RINGERS IV BOLUS
1000.0000 mL | Freq: Once | INTRAVENOUS | Status: AC
  Administered 2020-12-25: 1000 mL via INTRAVENOUS

## 2020-12-25 MED ORDER — ONDANSETRON HCL 4 MG/2ML IJ SOLN
4.0000 mg | Freq: Once | INTRAMUSCULAR | Status: AC
  Administered 2020-12-25: 4 mg via INTRAVENOUS
  Filled 2020-12-25: qty 2

## 2020-12-25 MED ORDER — ACETAMINOPHEN 500 MG PO TABS
1000.0000 mg | ORAL_TABLET | Freq: Once | ORAL | Status: AC
  Administered 2020-12-25: 1000 mg via ORAL
  Filled 2020-12-25: qty 2

## 2020-12-25 MED ORDER — IBUPROFEN 400 MG PO TABS
400.0000 mg | ORAL_TABLET | Freq: Once | ORAL | Status: AC
  Administered 2020-12-25: 400 mg via ORAL
  Filled 2020-12-25: qty 1

## 2020-12-25 MED ORDER — ONDANSETRON HCL 4 MG PO TABS: 4.0000 mg | ORAL_TABLET | Freq: Three times a day (TID) | ORAL | 0 refills | Status: DC | PRN

## 2020-12-25 MED ORDER — IOHEXOL 350 MG/ML SOLN
75.0000 mL | Freq: Once | INTRAVENOUS | Status: AC | PRN
  Administered 2020-12-25: 75 mL via INTRAVENOUS

## 2020-12-25 NOTE — ED Triage Notes (Signed)
Pt comes into the ED via POV c/o COVID exposure and now having chest pain, cough, fever, sore throat, and body aches.  Pt currently has even and unlabored respirations.  Pt states her symptoms started on 12/21/20.  Pt describes her CP as being centrally located with no radiation and no SHOB.  Pt does have some lightheadedness and nausea.

## 2020-12-25 NOTE — ED Provider Notes (Signed)
[facility] Emergency Department Provider Note  ____________________________________________   Event Date/Time   First MD Initiated Contact with Patient 12/25/20 2015     (approximate)  I have reviewed the triage vital signs and the nursing notes.   HISTORY  Chief Complaint Chest Pain and Covid Exposure   HPI Stephanie Mcguire is a 49 y.o. female with a past medical history of asthma, vocal disorder, and thyroid disease as well as interstitial lung disease of undetermined etiology who presents for assessment approximately 8 days of myalgias, nausea, diarrhea, sore throat, cough, and worsening chest pain.  Patient states her chest pain got particularly worse today is associated with some fever and shortness of breath and she was to be checked out.  She denies any vomiting, earache, hemoptysis, blood in her stool, urinary symptoms, rash or extremity pain.  Denies tobacco abuse, EtOH use or illicit drug use.  She has been vaccinated against COVID-19.  Denies illicit drug use or daily EtOH use.         Past Medical History:  Diagnosis Date  . Asthma   . Bipolar 2 disorder (HCC)   . Thyroid disease     Patient Active Problem List   Diagnosis Date Noted  . Lumbar radiculopathy 10/17/2020  . Patellofemoral arthritis of right knee 10/01/2020  . Acute medial meniscal tear, right, initial encounter 08/02/2020  . Interstitial lung disease (HCC) 10/14/2015  . Dyspnea on exertion 10/14/2015  . Congenital hypothyroidism without goiter 10/14/2015  . Abnormal chest CT 10/14/2015  . 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 BUNIONECTOMY    . MANDIBLE SURGERY  2009  . RECONSTRUCTION OF EYELID  1989 and 1990    Prior to Admission medications   Medication Sig Start Date End Date Taking? Authorizing Provider  ondansetron (ZOFRAN) 4 MG tablet Take 1 tablet (4 mg total) by mouth every 8 (eight) hours as needed for up  to 10 doses for nausea or vomiting. 12/25/20  Yes Gilles ChiquitoSmith, Jaicey Sweaney P, MD  buPROPion (WELLBUTRIN XL) 150 MG 24 hr tablet Take 150 mg by mouth daily.    [provider]  cetirizine-pseudoephedrine (ZYRTEC-D) 5-120 MG tablet Take 1 tablet by mouth 2 (two) times daily as needed for allergies. 08/12/16   Hassan RowanWade, Eugene, MD  clonazePAM (KLONOPIN) 0.5 MG tablet Take 0.5 mg by mouth daily.    [provider]  divalproex (DEPAKOTE ER) 500 MG 24 hr tablet  01/09/17   [provider]  fluticasone (FLONASE) 50 MCG/ACT nasal spray Place 2 sprays into both nostrils daily. 08/12/16   Hassan RowanWade, Eugene, MD  Hyaluronan (MONOVISC) 4288 MG/4ML SOSY Patient will bring to clinic where injection will be preformed 11/13/20   Judi SaaSmith, Harris Kistler M, DO  levothyroxine (SYNTHROID, LEVOTHROID) 100 MCG tablet Take 100 mcg by mouth daily before breakfast.    [provider]  norethindrone-ethinyl estradiol (LOESTRIN) 1-20 MG-MCG tablet TAKE 1 TABLET DAILY 04/02/20   Conard NovakJackson, Stephen D, MD    Allergies Patient has no known allergies.  Family History  Problem Relation Age of Onset  . Parkinson's disease Mother   . Hypertension Father     Social History Social History   Tobacco Use  . Smoking status: Never Smoker  . Smokeless tobacco: Never Used  Vaping Use  . Vaping Use: Never used  Substance Use Topics  . Alcohol use: Yes    Alcohol/week: 2.0 standard drinks    Types: 2 Glasses of wine  per week  . Drug use: No    Review of Systems  Review of Systems  Constitutional: Positive for chills, fever and malaise/fatigue.  HENT: Negative for sore throat.   Eyes: Negative for pain.  Respiratory: Positive for cough and shortness of breath. Negative for stridor.   Cardiovascular: Positive for chest pain.  Gastrointestinal: Positive for diarrhea and nausea. Negative for vomiting.  Genitourinary: Negative for dysuria.  Musculoskeletal: Positive for myalgias.  Skin: Negative for rash.  Neurological:  Positive for headaches. Negative for seizures and loss of consciousness.  Psychiatric/Behavioral: Negative for suicidal ideas.  All other systems reviewed and are negative.     ____________________________________________   PHYSICAL EXAM:  VITAL SIGNS: ED Triage Vitals  Enc Vitals Group     BP 12/25/20 1507 (!) 155/91     Pulse Rate 12/25/20 1507 (!) 123     Resp 12/25/20 1507 18     Temp 12/25/20 1507 98.4 F (36.9 C)     Temp Source 12/25/20 1507 Oral     SpO2 12/25/20 1507 98 %     Weight 12/25/20 1504 153 lb (69.4 kg)     Height 12/25/20 1504 5\' 6"  (1.676 m)     Head Circumference --      Peak Flow --      Pain Score 12/25/20 1504 5     Pain Loc --      Pain Edu? --      Excl. in GC? --    Vitals:   12/25/20 2030 12/25/20 2100  BP: (!) 134/101 (!) 142/99  Pulse: (!) 114 (!) 110  Resp: (!) 26 (!) 25  Temp:    SpO2: 97% 97%   Physical Exam Vitals and nursing note reviewed.  Constitutional:      General: She is not in acute distress.    Appearance: She is well-developed and well-nourished.  HENT:     Head: Normocephalic and atraumatic.     Right Ear: External ear normal.     Left Ear: External ear normal.     Nose: Nose normal.     Mouth/Throat:     Mouth: Mucous membranes are dry.  Eyes:     Conjunctiva/sclera: Conjunctivae normal.  Cardiovascular:     Rate and Rhythm: Regular rhythm. Tachycardia present.     Heart sounds: No murmur heard.   Pulmonary:     Effort: Pulmonary effort is normal. No respiratory distress.     Breath sounds: Normal breath sounds.  Abdominal:     Palpations: Abdomen is soft.     Tenderness: There is no abdominal tenderness. There is no right CVA tenderness or left CVA tenderness.  Musculoskeletal:        General: No edema.     Cervical back: Neck supple.     Right lower leg: No edema.     Left lower leg: No edema.  Skin:    General: Skin is warm and dry.     Capillary Refill: Capillary refill takes less than 2 seconds.   Neurological:     Mental Status: She is alert and oriented to person, place, and time.  Psychiatric:        Mood and Affect: Mood and affect and mood normal.      ____________________________________________   LABS (all labs ordered are listed, but only abnormal results are displayed)  Labs Reviewed  RESP PANEL BY RT-PCR (FLU A&B, COVID) ARPGX2 - Abnormal; Notable for the following components:      Result  Value   SARS Coronavirus 2 by RT PCR POSITIVE (*)    All other components within normal limits  BASIC METABOLIC PANEL - Abnormal; Notable for the following components:   Sodium 133 (*)    Chloride 97 (*)    All other components within normal limits  CBC  BRAIN NATRIURETIC PEPTIDE  PROCALCITONIN  MAGNESIUM  HEPATIC FUNCTION PANEL  POC SARS CORONAVIRUS 2 AG  POC URINE PREG, ED  TROPONIN I (HIGH SENSITIVITY)  TROPONIN I (HIGH SENSITIVITY)   ____________________________________________  EKG  Sinus tachycardia with ventricular rate of 115, normal axis, unremarkable intervals, and no evidence of acute ischemia although some nonspecific change in lead III without any other significant underlying arrhythmia. ____________________________________________  RADIOLOGY  ED MD interpretation: Some fibrosis in the upper lobes without focal consolidation, pneumothorax, large effusion, overt edema or other acute intrathoracic process.  Official radiology report(s): DG Chest 2 View  Result Date: 12/25/2020 CLINICAL DATA:  Shortness of breath and chest pain. COVID exposure. Cough, fever, sore throat, and body aches. EXAM: CHEST - 2 VIEW COMPARISON:  04/18/2020 FINDINGS: Normal heart size and pulmonary vascularity. Emphysematous changes and fibrosis in the upper lungs. No airspace disease or consolidation. No pleural effusions. No pneumothorax. Mediastinal contours appear intact. IMPRESSION: Emphysematous changes and fibrosis in the upper lungs. No evidence of active pulmonary disease.  Electronically Signed   By: Burman Nieves M.D.   On: 12/25/2020 15:31   CT Angio Chest PE W and/or Wo Contrast  Result Date: 12/25/2020 CLINICAL DATA:  Chest pain fever cough COVID EXAM: CT ANGIOGRAPHY CHEST WITH CONTRAST TECHNIQUE: Multidetector CT imaging of the chest was performed using the standard protocol during bolus administration of intravenous contrast. Multiplanar CT image reconstructions and MIPs were obtained to evaluate the vascular anatomy. CONTRAST:  55mL OMNIPAQUE IOHEXOL 350 MG/ML SOLN COMPARISON:  Chest x-ray 12/25/2020, 04/18/2020, 03/16/2013 FINDINGS: Cardiovascular: Satisfactory opacification of the pulmonary arteries to the segmental level. No evidence of pulmonary embolism. Nonaneurysmal aorta. Normal cardiac size. No pericardial effusion Mediastinum/Nodes: No enlarged mediastinal, hilar, or axillary lymph nodes. Thyroid gland, trachea, and esophagus demonstrate no significant findings. Lungs/Pleura: Right greater than left apical pleural and parenchymal scarring. No acute consolidation, pleural effusion or pneumothorax. Upper Abdomen: No acute abnormality. Musculoskeletal: No chest wall abnormality. No acute or significant osseous findings. Review of the MIP images confirms the above findings. IMPRESSION: 1. Negative for acute pulmonary embolus. 2. Right greater than left apical pleural and parenchymal scarring. No acute airspace disease. Electronically Signed   By: Jasmine Pang M.D.   On: 12/25/2020 22:15    ____________________________________________   PROCEDURES  Procedure(s) performed (including Critical Care):  .1-3 Lead EKG Interpretation Performed by: Gilles Chiquito, MD Authorized by: Gilles Chiquito, MD     Interpretation: abnormal     ECG rate assessment: tachycardic     Rhythm: sinus tachycardia     Ectopy: none     Conduction: normal       ____________________________________________   INITIAL IMPRESSION / ASSESSMENT AND PLAN / ED COURSE       Patient presents with above-stated history exam for assessment of myalgias, sore throat, nausea, diarrhea, cough, shortness of breath and worsening chest pain.  Patient is tachycardic with otherwise stable vital signs on arrival.  Rapid Covid is positive.  Chest x-ray shows no evidence of bacterial pneumonia, large effusion, overt edema, pneumothorax or focal consolidation suggestive of bacterial pneumonia.  ECG and troponin of 6 obtained greater than 3 hours after symptom onset  is not suggestive of ACS or myocarditis.  CBC is unremarkable.  BMP shows no significant electrolyte or metabolic derangements.  Given tachycardia and chest pain CT obtained to assess for evidence of PE.  CTA shows no evidence of PE or other acute intrathoracic process.  BMP is unremarkable procalcitonin also not suggestive of bacterial infection this time.  Patient's heart rate did decrease to the mid 90s after 2 L of IV fluid, Zofran, Tylenol, and ibuprofen.  She states she felt much better.  Given improvement in heart rate with otherwise reassuring exam and work-up however she is safe for discharge with plan for outpatient follow-up.  Rx written for Zofran.  Discharged stable condition.   ____________________________________________   FINAL CLINICAL IMPRESSION(S) / ED DIAGNOSES  Final diagnoses:  COVID  Dehydration    Medications  lactated ringers bolus 1,000 mL (0 mLs Intravenous Stopped 12/25/20 2215)  ondansetron (ZOFRAN) injection 4 mg (4 mg Intravenous Given 12/25/20 2034)  acetaminophen (TYLENOL) tablet 1,000 mg (1,000 mg Oral Given 12/25/20 2035)  ibuprofen (ADVIL) tablet 400 mg (400 mg Oral Given 12/25/20 2034)  iohexol (OMNIPAQUE) 350 MG/ML injection 75 mL (75 mLs Intravenous Contrast Given 12/25/20 2159)  lactated ringers bolus 1,000 mL (1,000 mLs Intravenous New Bag/Given 12/25/20 2215)     ED Discharge Orders         Ordered    ondansetron (ZOFRAN) 4 MG tablet  Every 8 hours PRN         12/25/20 2231           Note:  This document was prepared using Dragon voice recognition software and may include unintentional dictation errors.   Gilles Chiquito, MD 12/25/20 2234

## 2021-01-06 ENCOUNTER — Ambulatory Visit: Payer: Self-pay | Admitting: *Deleted

## 2021-01-06 NOTE — Telephone Encounter (Signed)
Patient calling with concerns of O2 sates being between 93-95%.Patient diagnosed with COVID on 12/25/20.Hx of interstitial lund scarring. Patient states at times she has chest pain. Patient states on Saturday she noticed her reading was below 90% with activity. No worsening of symptoms currently. Care advice given. Patient advised to seek treatment in the ED or UC if chest pain becomes worse or if O2 sats drop below 90%. Understanding verbalized.  Reason for Disposition . Oxygen level (e.g., pulse oximetry) 91 to 94 percent  Answer Assessment - Initial Assessment Questions 1. MAIN CONCERN OR SYMPTOM: "What's your main concern?" (e.g., low oxygen level, breathing difficulty) "What question do you have?"     SOB at times 2. ONSET: "When did the  Low O2 sats  start?"     Since diagnosis of COVID 3. OXYGEN THERAPY:    - "Do you currently use home oxygen?"    - If Yes, ask: "What is your oxygen source?" (e.g., O2 tank, O2 concentrator).    - If Yes, ask: "How do you get the oxygen?" (e.g., nasal prongs, face mask).    - If Yes, ask: "How much oxygen are you supposed to use?" (e.g., 1-2 L [place])     no 4.  OXYGEN EQUIPMENT:  "Are you having any trouble with your oxygen equipment?"  (e.g., cannula, mask, tubing, tank, concentrator)     n/a 5. PULSE OXIMETER:    - "Do you have a pulse oximeter (pulse ox)?"     - If Yes, ask: "Where do you place the probe?" (e.g., fingertip, ear lobe)     Yes, reading 93% 6. O2 MONITORING: "What is the oxygen level (pulse ox reading)?" (e.g., 70-100%)     93% at the time of call 7. VSS MONITORING: "Do you monitor/measure your oxygen level or vital signs?" (e.g., yes, no, measurements are automatically sent to provider/call center). Document CURRENT and NORMAL BASELINE values if available.     -  P: "What is your pulse rate per minute?"   -  RR: "What is your respiratory rate per minute?"     Other VS not taken  8. BREATHING DIFFICULTY: "Are you having any difficulty  breathing?" If Yes, ask: "How bad is it?"  (e.g., none, mild, moderate, severe)    - MILD: No SOB at rest, mild SOB with walking, speaks normally in sentences, able to lie down, no retractions, pulse < 100.    - MODERATE: SOB at rest, SOB with minimal exertion and prefers to sit, cannot lie down flat, speaks in phrases, mild retractions, audible wheezing, pulse 100-120.    - SEVERE: Very SOB at rest, speaks in single words, struggling to breathe, sitting hunched forward, retractions, pulse > 120      SOB at times 9. OTHER SYMPTOMS: "Do you have any other symptoms?" (e.g., fever, change in sputum)     no 10. SMOKING: "Do you smoke currently?" "Is there anyone that smokes around you?"  (Note: smoking around oxygen is dangerous!)       Not assessed  Protocols used: OXYGEN MONITORING AND HYPOXIA-A-AH

## 2021-01-20 ENCOUNTER — Encounter: Payer: Self-pay | Admitting: Family Medicine

## 2021-01-31 DIAGNOSIS — F3131 Bipolar disorder, current episode depressed, mild: Secondary | ICD-10-CM | POA: Diagnosis not present

## 2021-01-31 DIAGNOSIS — F3111 Bipolar disorder, current episode manic without psychotic features, mild: Secondary | ICD-10-CM | POA: Diagnosis not present

## 2021-02-05 ENCOUNTER — Ambulatory Visit: Payer: Self-pay | Admitting: *Deleted

## 2021-02-05 ENCOUNTER — Ambulatory Visit: Payer: BC Managed Care – PPO | Admitting: Family Medicine

## 2021-02-05 DIAGNOSIS — M25561 Pain in right knee: Secondary | ICD-10-CM | POA: Diagnosis not present

## 2021-02-05 DIAGNOSIS — R030 Elevated blood-pressure reading, without diagnosis of hypertension: Secondary | ICD-10-CM | POA: Diagnosis not present

## 2021-02-05 DIAGNOSIS — Z8616 Personal history of COVID-19: Secondary | ICD-10-CM | POA: Diagnosis not present

## 2021-02-05 DIAGNOSIS — J189 Pneumonia, unspecified organism: Secondary | ICD-10-CM | POA: Diagnosis not present

## 2021-02-05 DIAGNOSIS — R002 Palpitations: Secondary | ICD-10-CM | POA: Diagnosis not present

## 2021-02-05 NOTE — Telephone Encounter (Signed)
C/o persistent chest pain and pressure since having covid in 12/25/20. C/o chest pressure after taking deep breaths. Patient denies chest pain at this time. Reports chest pain lasting 2 minutes. Chest pressure after taking deep breaths is on and off since this am around 10. C/o dizziness, nausea, and coughing after taking a shower. Denies hot shower. Reports apple watch reports HR while eating is 116 and now at rest talking is 101. Reports hx of exercised induces asthma . Denies taking inhalers. Patient instructed to go to [facility] or ED . Care advise given. Patient verbalized understanding of care advise and to go to [facility] or ED . Patient will be a new patient of Dr. Yetta Barre.   Reason for Disposition . Taking a deep breath makes pain worse  Answer Assessment - Initial Assessment Questions 1. LOCATION: "Where does it hurt?"       Chest area 2. RADIATION: "Does the pain go anywhere else?" (e.g., into neck, jaw, arms, back)     No  3. ONSET: "When did the chest pain begin?" (Minutes, hours or days)       Started after covid positive in 12/25/20. 4. PATTERN "Does the pain come and go, or has it been constant since it started?"  "Does it get worse with exertion?"      Chest pain can last 2 minutes chest pressure comes and goes with taking a deep breath. 5. DURATION: "How long does it last" (e.g., seconds, minutes, hours)     Chest pain  2 minutes , chest pressure on and off 6. SEVERITY: "How bad is the pain?"  (e.g., Scale 1-10; mild, moderate, or severe)    - MILD (1-3): doesn't interfere with normal activities     - MODERATE (4-7): interferes with normal activities or awakens from sleep    - SEVERE (8-10): excruciating pain, unable to do any normal activities       mild 7. CARDIAC RISK FACTORS: "Do you have any history of heart problems or risk factors for heart disease?" (e.g., angina, prior heart attack; diabetes, high blood pressure, high cholesterol, smoker, or strong family history of heart disease)      High cholesterol no medication given . 8. PULMONARY RISK FACTORS: "Do you have any history of lung disease?"  (e.g., blood clots in lung, asthma, emphysema, birth control pills)     interstial scarring ,. Exercised induced asthma 9. CAUSE: "What do you think is causing the chest pain?"     Not sure  10. OTHER SYMPTOMS: "Do you have any other symptoms?" (e.g., dizziness, nausea, vomiting, sweating, fever, difficulty breathing, cough)       Not now . Does have dizziness, nausea and coughing after showering. 11. PREGNANCY: "Is there any chance you are pregnant?" "When was your last menstrual period?"       na  Protocols used: CHEST PAIN-A-AH

## 2021-02-06 NOTE — Telephone Encounter (Signed)
noted 

## 2021-02-11 ENCOUNTER — Ambulatory Visit (INDEPENDENT_AMBULATORY_CARE_PROVIDER_SITE_OTHER): Payer: BC Managed Care – PPO | Admitting: Family Medicine

## 2021-02-11 ENCOUNTER — Encounter: Payer: Self-pay | Admitting: Family Medicine

## 2021-02-11 ENCOUNTER — Other Ambulatory Visit: Payer: Self-pay

## 2021-02-11 VITALS — BP 140/100 | HR 80 | Resp 12 | Ht 65.0 in | Wt 157.0 lb

## 2021-02-11 DIAGNOSIS — U071 COVID-19: Secondary | ICD-10-CM | POA: Diagnosis not present

## 2021-02-11 DIAGNOSIS — J849 Interstitial pulmonary disease, unspecified: Secondary | ICD-10-CM

## 2021-02-11 DIAGNOSIS — Z7689 Persons encountering health services in other specified circumstances: Secondary | ICD-10-CM

## 2021-02-11 DIAGNOSIS — R079 Chest pain, unspecified: Secondary | ICD-10-CM | POA: Diagnosis not present

## 2021-02-11 DIAGNOSIS — R030 Elevated blood-pressure reading, without diagnosis of hypertension: Secondary | ICD-10-CM | POA: Diagnosis not present

## 2021-02-11 DIAGNOSIS — E031 Congenital hypothyroidism without goiter: Secondary | ICD-10-CM

## 2021-02-11 DIAGNOSIS — F319 Bipolar disorder, unspecified: Secondary | ICD-10-CM

## 2021-02-11 DIAGNOSIS — J1282 Pneumonia due to coronavirus disease 2019: Secondary | ICD-10-CM

## 2021-02-11 NOTE — Progress Notes (Signed)
Date:  02/11/2021   Name:  Stephanie Mcguire   DOB:  07/31/1971   MRN:  [id number]   Chief Complaint: Establish Care  Patient is a 50 year old female who presents for a establish care. The patient reports the following problems: chest tightness. Health maintenance has been reviewed up to date.      Lab Results  Component Value Date   CREATININE 0.83 12/25/2020   BUN 7 12/25/2020   NA 133 (L) 12/25/2020   K 3.7 12/25/2020   CL 97 (L) 12/25/2020   CO2 27 12/25/2020   No results found for: CHOL, HDL, LDLCALC, LDLDIRECT, TRIG, CHOLHDL No results found for: TSH No results found for: HGBA1C Lab Results  Component Value Date   WBC 6.4 12/25/2020   HGB 14.7 12/25/2020   HCT 43.2 12/25/2020   MCV 93.5 12/25/2020   PLT 268 12/25/2020   No results found for: ALT, AST, GGT, ALKPHOS, BILITOT   Review of Systems  Constitutional: Negative.  Negative for chills, fatigue, fever and unexpected weight change.  HENT: Negative for congestion, ear discharge, ear pain, rhinorrhea, sinus pressure, sneezing and sore throat.   Eyes: Negative for double vision, photophobia, pain, discharge, redness and itching.  Respiratory: Negative for cough, shortness of breath, wheezing and stridor.   Gastrointestinal: Negative for abdominal pain, blood in stool, constipation, diarrhea, nausea and vomiting.  Endocrine: Negative for cold intolerance, heat intolerance, polydipsia, polyphagia and polyuria.  Genitourinary: Negative for dysuria, flank pain, frequency, hematuria, menstrual problem, pelvic pain, urgency, vaginal bleeding and vaginal discharge.  Musculoskeletal: Negative for arthralgias, back pain and myalgias.  Skin: Negative for rash.  Allergic/Immunologic: Negative for environmental allergies and food allergies.  Neurological: Negative for dizziness, weakness, light-headedness, numbness and headaches.  Hematological: Negative for adenopathy. Does not bruise/bleed easily.   Psychiatric/Behavioral: Negative for dysphoric mood. The patient is not nervous/anxious.     Patient Active Problem List   Diagnosis Date Noted  . Lumbar radiculopathy 10/17/2020  . Patellofemoral arthritis of right knee 10/01/2020  . Acute medial meniscal tear, right, initial encounter 08/02/2020  . Interstitial lung disease ([place]) 10/14/2015  . Dyspnea on exertion 10/14/2015  . Congenital hypothyroidism without goiter 10/14/2015  . Abnormal chest CT 10/14/2015  . Essential vulvodynia 12/10/2011    No Known Allergies  Past Surgical History:  Procedure Laterality Date  . BUNIONECTOMY    . MANDIBLE SURGERY  2009  . RECONSTRUCTION OF EYELID  1989 and 1990    Social History   Tobacco Use  . Smoking status: Never Smoker  . Smokeless tobacco: Never Used  Vaping Use  . Vaping Use: Never used  Substance Use Topics  . Alcohol use: Yes    Alcohol/week: 2.0 standard drinks    Types: 2 Glasses of wine per week  . Drug use: No     Medication list has been reviewed and updated.  Current Meds  Medication Sig  . albuterol (VENTOLIN HFA) 108 (90 Base) MCG/ACT inhaler Inhale into the lungs.  Marland Kitchen amoxicillin (AMOXIL) 500 MG capsule Take by mouth.  Marland Kitchen azithromycin (ZITHROMAX) 250 MG tablet Take 2 tablets orally today, then 1 tablet for the next 4 days.  Marland Kitchen buPROPion (WELLBUTRIN XL) 150 MG 24 hr tablet Take 150 mg by mouth daily.  . carbamazepine (TEGRETOL) 200 MG tablet Dr Toy Care  . cetirizine-pseudoephedrine (ZYRTEC-D) 5-120 MG tablet Take 1 tablet by mouth 2 (two) times daily as needed for allergies.  . clonazePAM (KLONOPIN) 0.5 MG tablet Take 0.5  mg by mouth as needed. Dr Toy Care  . fluticasone (FLONASE) 50 MCG/ACT nasal spray Place 2 sprays into both nostrils daily.  Marland Kitchen levothyroxine (SYNTHROID, LEVOTHROID) 100 MCG tablet Take 100 mcg by mouth daily before breakfast.  . LORazepam (ATIVAN) 1 MG tablet Take 1 mg by mouth daily as needed. Dr Toy Care  . meloxicam (MOBIC) 15 MG tablet Take 15 mg  by mouth daily. ortho  . methylPREDNISolone (MEDROL DOSEPAK) 4 MG TBPK tablet Follow schedule on package instructions  . norethindrone-ethinyl estradiol (LOESTRIN) 1-20 MG-MCG tablet TAKE 1 TABLET DAILY (Patient taking differently: [facility])    PHQ 2/9 Scores 02/11/2021  PHQ - 2 Score 4  PHQ- 9 Score 17    GAD 7 : Generalized Anxiety Score 02/11/2021  Nervous, Anxious, on Edge 3  Control/stop worrying 2  Worry too much - different things 2  Trouble relaxing 3  Restless 2  Easily annoyed or irritable 3  Afraid - awful might happen 1  Total GAD 7 Score 16  Anxiety Difficulty Somewhat difficult    BP Readings from Last 3 Encounters:  02/11/21 (!) 140/100  12/25/20 130/83  12/17/20 120/82    Physical Exam Constitutional:      Appearance: She is well-developed and well-nourished.  HENT:     Head: Normocephalic.     Right Ear: Tympanic membrane, ear canal and external ear normal.     Left Ear: Tympanic membrane, ear canal and external ear normal.     Nose: Nose normal.     Mouth/Throat:     Mouth: Oropharynx is clear and moist.  Eyes:     General: Lids are everted, no foreign bodies appreciated. No scleral icterus.       Left eye: No foreign body or hordeolum.     Extraocular Movements: EOM normal.     Conjunctiva/sclera: Conjunctivae normal.     Right eye: Right conjunctiva is not injected.     Left eye: Left conjunctiva is not injected.     Pupils: Pupils are equal, round, and reactive to light.  Neck:     Thyroid: No thyromegaly.     Vascular: No JVD.     Trachea: No tracheal deviation.  Cardiovascular:     Rate and Rhythm: Normal rate and regular rhythm.     Pulses: Intact distal pulses.     Heart sounds: Normal heart sounds, S1 normal and S2 normal. No murmur heard.  No systolic murmur is present.  No diastolic murmur is present. No friction rub. No gallop. No S3 or S4 sounds.   Pulmonary:     Effort: Pulmonary effort is normal. No respiratory distress.      Breath sounds: Normal breath sounds. No decreased breath sounds, wheezing, rhonchi or rales.  Chest:     Chest wall: Tenderness present.    Abdominal:     General: Bowel sounds are normal.     Palpations: Abdomen is soft. There is no hepatomegaly, splenomegaly, hepatosplenomegaly or mass.     Tenderness: There is no abdominal tenderness. There is no guarding or rebound.  Musculoskeletal:        General: No tenderness or edema. Normal range of motion.     Cervical back: Normal range of motion and neck supple.  Lymphadenopathy:     Cervical: No cervical adenopathy.  Skin:    General: Skin is warm.     Findings: No rash.  Neurological:     Mental Status: She is alert and oriented to person, place, and time.  Cranial Nerves: No cranial nerve deficit.     Deep Tendon Reflexes: Strength normal. Reflexes normal.  Psychiatric:        Mood and Affect: Mood and affect normal. Mood is not anxious or depressed.     Wt Readings from Last 3 Encounters:  02/11/21 157 lb (71.2 kg)  12/25/20 153 lb (69.4 kg)  12/17/20 157 lb (71.2 kg)    BP (!) 140/100   Pulse 80   Ht $R'5\' 5"'px$  (1.651 m)   Wt 157 lb (71.2 kg)   BMI 26.13 kg/m   Assessment and Plan:  1. Establishing care with new doctor, encounter for Patient establishing care with new physician with multiple concerns.  Patient was recently diagnosed with Covid and has been having some symptoms including chest discomfort, history of psychiatric depression, and thyroid disease. COVID-19 positive test (U07.1, COVID-19) with Acute Pneumonia (J12.89, Other viral pneumonia) (If respiratory failure or sepsis present, add as separate assessment)    2. Elevated blood-pressure reading without diagnosis of hypertension Patient's had some elevated blood pressure readings however they have not been significantly elevated and not consistent with intermittent elevations due to anxiety.  Patient is to return in 3 weeks at which time we will recheck  a blood pressure in addition to the chest x-rays that need to be repeated.  3. Chest pain at rest Patient appears today with tightness in her chest and we are the third place that this has occurred.  Patient has been seen recently in the ER as well as urgent care and here today.  It is substernal she has tenderness along the costochondral margin but describes it as tightness and we will obtain an EKG on her today.  EKG as follows. I have reviewed EKG which shows rate 87 and regular intervals are normal.  There is no voltage criteria met for cardiomyopathy, there is no ischemic changes today's EKG notes no Q waves no ST interval abnormalities there is some negative T waves in V1 and V2 but there is also a negative T waves in V1 from the previous EKG.  This is likely normal for age there is no delay in the R waves.  We will attempt to get an EKG reading if patient is still having symptoms when she returns in 3 weeks but patient has been encouraged to go to the ER if chest tightness continues.. Comparison to previous EKG dated compared to previous EKG that was done at [facility] regional notes sinus tachycardia otherwise normal. - EKG 12-Lead  4. Pneumonia due to COVID-19 virus Patient was diagnosed with pneumonia at the most recent urgent care by x-ray.  Chest auscultation reveals no rales rhonchi's or wheezes or rub.  Pulse ox was noted to be 98%.  We will recheck patient in 3 weeks at which time we will repeat her chest x-ray.  5. Congenital hypothyroidism without goiter Patient has a history of hypothyroid disease which I will be assuming care of she is currently on 100 mcg daily except for the weekend of which 1 day she takes 150 mcg.  6. Interstitial lung disease ([place]) Patient has a history of interstitial lung disease which we will recheck when we do her repeat chest x-ray.  7. Bipolar depression ([place]) Patient has a bipolar depression history for which she is on Depakote and other medications for  depression.  She will be continuing care for her's psychiatric concerns with her psychiatrist.

## 2021-03-05 ENCOUNTER — Ambulatory Visit (INDEPENDENT_AMBULATORY_CARE_PROVIDER_SITE_OTHER): Payer: BC Managed Care – PPO | Admitting: Family Medicine

## 2021-03-05 ENCOUNTER — Other Ambulatory Visit: Payer: Self-pay

## 2021-03-05 ENCOUNTER — Encounter: Payer: Self-pay | Admitting: Family Medicine

## 2021-03-05 ENCOUNTER — Ambulatory Visit
Admission: RE | Admit: 2021-03-05 | Discharge: 2021-03-05 | Disposition: A | Discharge status: Home / Self Care | Payer: BC Managed Care – PPO | Attending: Family Medicine | Admitting: Family Medicine

## 2021-03-05 ENCOUNTER — Ambulatory Visit
Admission: RE | Admit: 2021-03-05 | Discharge: 2021-03-05 | Disposition: A | Discharge status: Home / Self Care | Payer: BC Managed Care – PPO | Source: Ambulatory Visit | Attending: Family Medicine | Admitting: Family Medicine

## 2021-03-05 VITALS — BP 130/87 | HR 68 | Ht 65.0 in | Wt 156.0 lb

## 2021-03-05 DIAGNOSIS — J1282 Pneumonia due to coronavirus disease 2019: Secondary | ICD-10-CM | POA: Insufficient documentation

## 2021-03-05 DIAGNOSIS — U071 COVID-19: Secondary | ICD-10-CM | POA: Diagnosis not present

## 2021-03-05 DIAGNOSIS — R0602 Shortness of breath: Secondary | ICD-10-CM | POA: Diagnosis not present

## 2021-03-05 DIAGNOSIS — R251 Tremor, unspecified: Secondary | ICD-10-CM | POA: Diagnosis not present

## 2021-03-05 DIAGNOSIS — E031 Congenital hypothyroidism without goiter: Secondary | ICD-10-CM | POA: Diagnosis not present

## 2021-03-05 IMAGING — CR DG CHEST 2V
2 series · 2 of 2 positions shown · non-contrast
Comparison: CTA chest and chest x-ray dated [DATE].

CLINICAL DATA: Chronic shortness of breath since COVID infection
last [REDACTED].

EXAM:
CHEST - 2 VIEW

[chest pa]
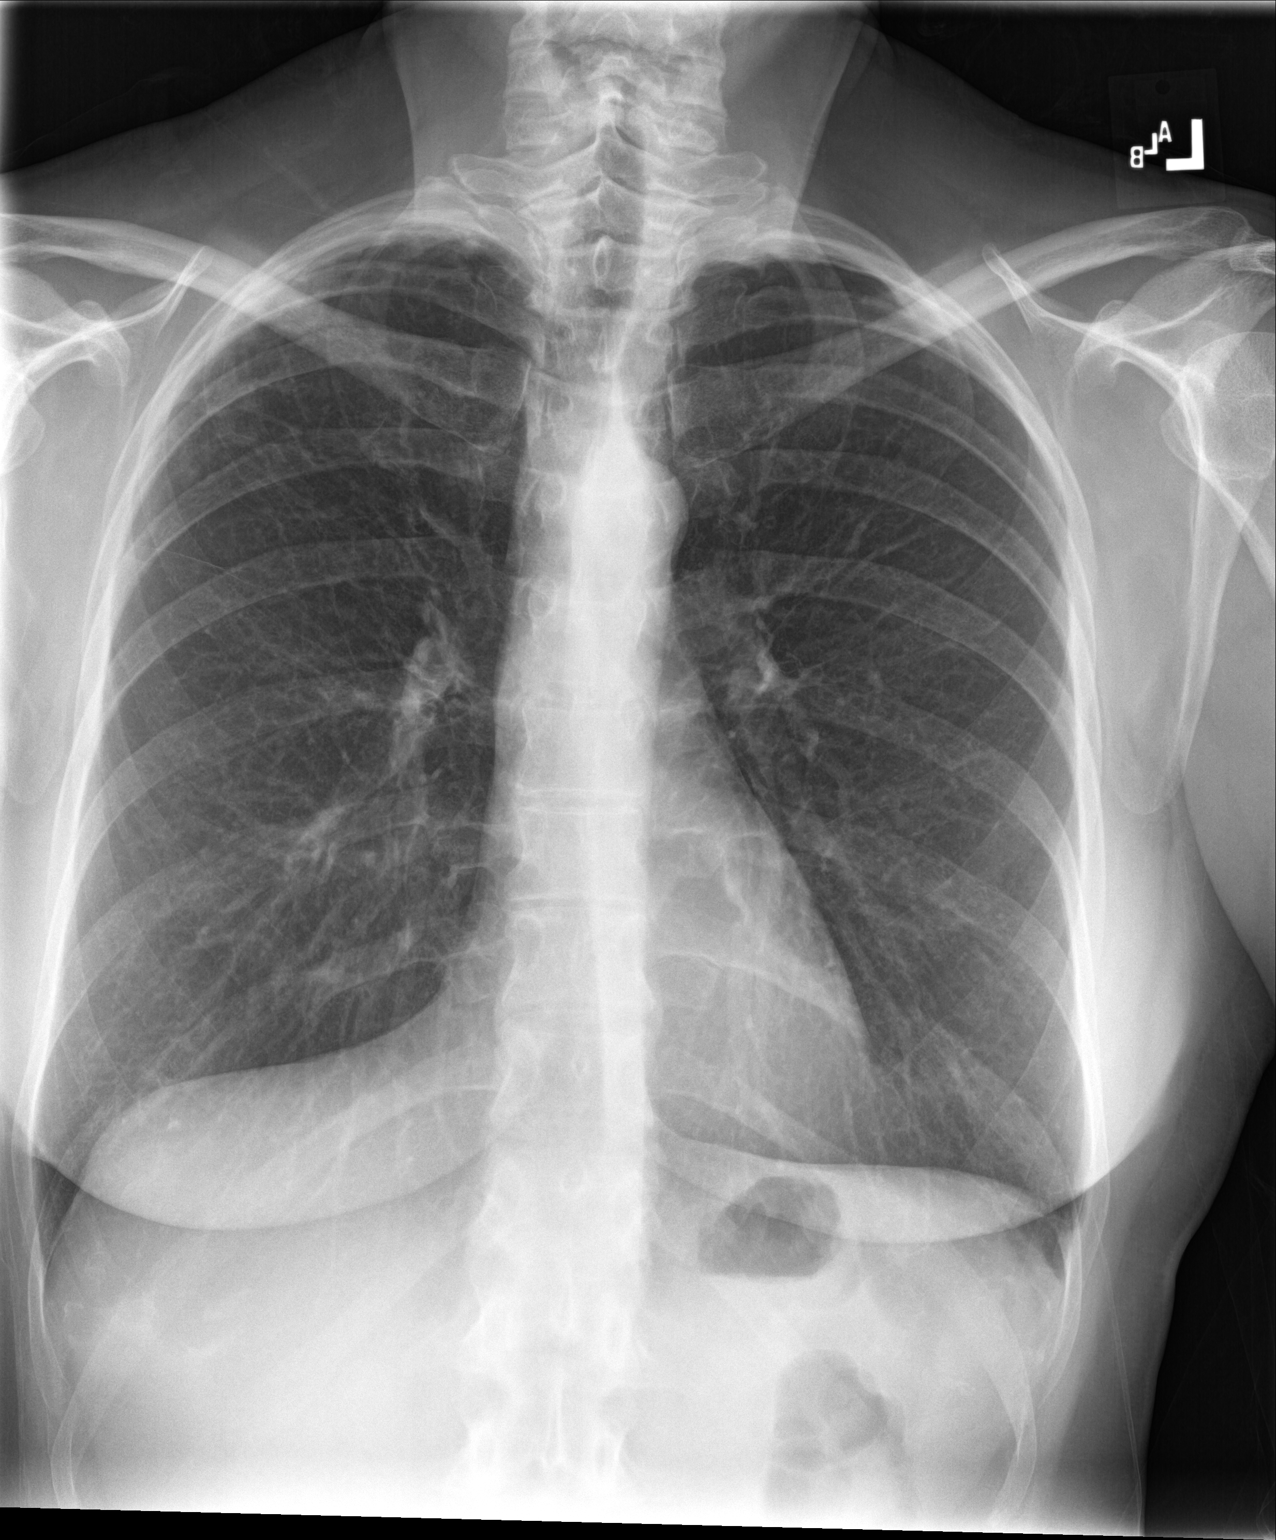

[chest lat]
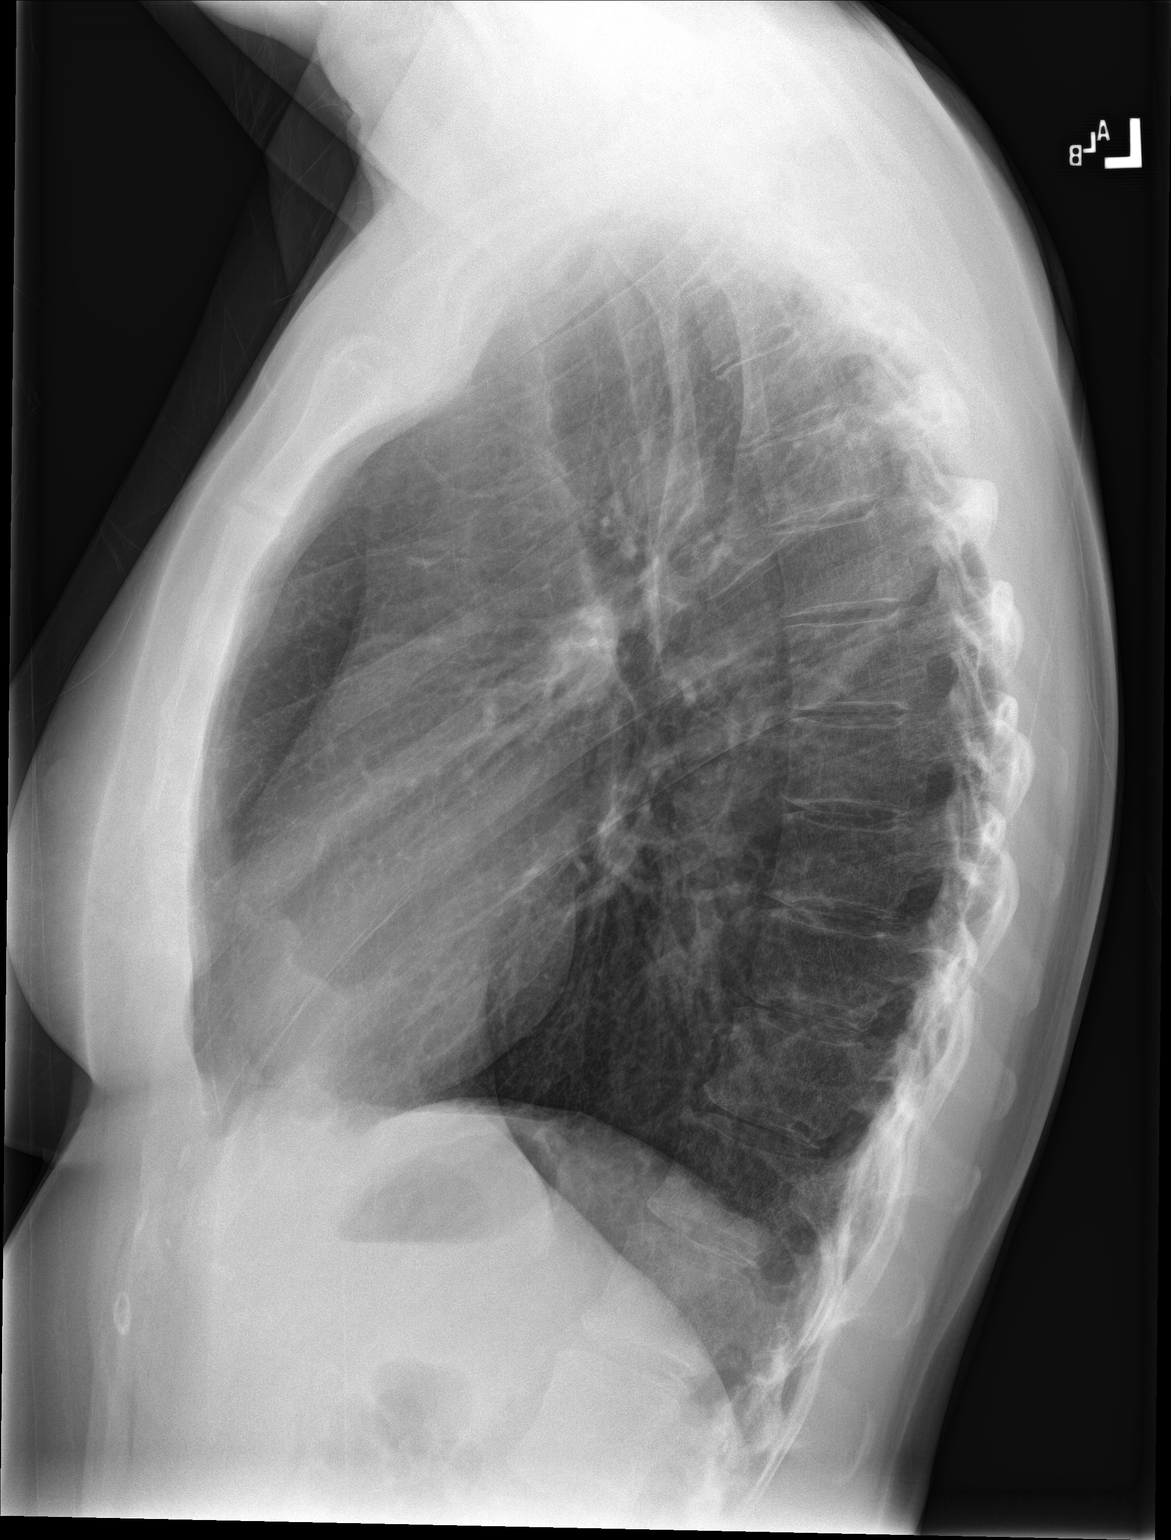

[2 of 2 positions shown; findings below may reference images not displayed]

FINDINGS: The heart size and mediastinal contours are within normal limits.
Normal pulmonary vascularity. Chronic pleural-parenchymal scarring
in the right upper lobe. No focal consolidation, pleural effusion,
or pneumothorax. No acute osseous abnormality.
IMPRESSION: No active cardiopulmonary disease.

## 2021-03-05 NOTE — Patient Instructions (Signed)

## 2021-03-05 NOTE — Progress Notes (Signed)
tsh   Date:  03/05/2021   Name:  Stephanie Mcguire   DOB:  [birth year]   MRN:  [id number]   Chief Complaint: Shortness of Breath (Repeat chest xray from COVID in Dec- still having some SOB at night when lying down) and Tremors (Referral to neurology for tremors and dropping things)  Shortness of Breath This is a new problem. The current episode started more than 1 month ago (had covid latter december). The problem has been gradually improving. Pertinent negatives include no abdominal pain, chest pain, claudication, coryza, ear pain, fever, headaches, hemoptysis, leg pain, leg swelling, neck pain, orthopnea, PND, rash, rhinorrhea, sore throat, sputum production, swollen glands, syncope, vomiting or wheezing. The symptoms are aggravated by lying flat. She has tried nothing for the symptoms. The treatment provided moderate relief. Her past medical history is significant for pneumonia. There is no history of allergies, aspirin allergies, asthma, bronchiolitis, CAD, chronic lung disease, COPD, DVT, a heart failure, PE or a recent surgery.  Neurologic Problem The patient's primary symptoms include clumsiness and a loss of balance. The patient's pertinent negatives include no altered mental status, focal sensory loss, focal weakness, memory loss, near-syncope, slurred speech, syncope, visual change or weakness. Primary symptoms comment: tremor/shaking with glass/handwriting worsen. This is a chronic (tremor for years) problem. The current episode started more than 1 year ago. The neurological problem developed gradually. The problem has been gradually worsening since onset. There was upper extremity (bilateral R>.L) focality noted. Associated symptoms include shortness of breath. Pertinent negatives include no abdominal pain, back pain, chest pain, dizziness, fatigue, fever, headaches, light-headedness, nausea, neck pain or vomiting.    Lab Results  Component Value Date   CREATININE 0.83 12/25/2020   BUN 7  12/25/2020   NA 133 (L) 12/25/2020   K 3.7 12/25/2020   CL 97 (L) 12/25/2020   CO2 27 12/25/2020   No results found for: CHOL, HDL, LDLCALC, LDLDIRECT, TRIG, CHOLHDL No results found for: TSH No results found for: HGBA1C Lab Results  Component Value Date   WBC 6.4 12/25/2020   HGB 14.7 12/25/2020   HCT 43.2 12/25/2020   MCV 93.5 12/25/2020   PLT 268 12/25/2020   No results found for: ALT, AST, GGT, ALKPHOS, BILITOT   Review of Systems  Constitutional: Negative.  Negative for chills, fatigue, fever and unexpected weight change.  HENT: Negative for congestion, ear discharge, ear pain, rhinorrhea, sinus pressure, sneezing and sore throat.   Eyes: Negative for double vision, photophobia, pain, discharge, redness and itching.  Respiratory: Positive for shortness of breath. Negative for cough, hemoptysis, sputum production, wheezing and stridor.   Cardiovascular: Negative for chest pain, orthopnea, claudication, leg swelling, syncope, PND and near-syncope.  Gastrointestinal: Negative for abdominal pain, blood in stool, constipation, diarrhea, nausea and vomiting.  Endocrine: Negative for cold intolerance, heat intolerance, polydipsia, polyphagia and polyuria.  Genitourinary: Negative for dysuria, flank pain, frequency, hematuria, menstrual problem, pelvic pain, urgency, vaginal bleeding and vaginal discharge.  Musculoskeletal: Negative for arthralgias, back pain, myalgias and neck pain.  Skin: Negative for rash.  Allergic/Immunologic: Negative for environmental allergies and food allergies.  Neurological: Positive for tremors and loss of balance. Negative for dizziness, focal weakness, syncope, weakness, light-headedness, numbness and headaches.  Hematological: Negative for adenopathy. Does not bruise/bleed easily.  Psychiatric/Behavioral: Negative for dysphoric mood and memory loss. The patient is not nervous/anxious.     Patient Active Problem List   Diagnosis Date Noted  . Lumbar  radiculopathy 10/17/2020  . Patellofemoral arthritis of  right knee 10/01/2020  . Acute medial meniscal tear, right, initial encounter 08/02/2020  . Interstitial lung disease ([facility]) 10/14/2015  . Dyspnea on exertion 10/14/2015  . Congenital hypothyroidism without goiter 10/14/2015  . Abnormal chest CT 10/14/2015  . Essential vulvodynia 12/10/2011    No Known Allergies  Past Surgical History:  Procedure Laterality Date  . BUNIONECTOMY    . MANDIBLE SURGERY  2009  . RECONSTRUCTION OF EYELID  1989 and 1990    Social History   Tobacco Use  . Smoking status: Never Smoker  . Smokeless tobacco: Never Used  Vaping Use  . Vaping Use: Never used  Substance Use Topics  . Alcohol use: Yes    Alcohol/week: 2.0 standard drinks    Types: 2 Glasses of wine per week  . Drug use: No     Medication list has been reviewed and updated.  Current Meds  Medication Sig  . albuterol (VENTOLIN HFA) 108 (90 Base) MCG/ACT inhaler Inhale into the lungs.  . carbamazepine (TEGRETOL) 200 MG tablet Dr Evelene Croon  . fluticasone (FLONASE) 50 MCG/ACT nasal spray Place 2 sprays into both nostrils daily.  Marland Kitchen lamoTRIgine (LAMICTAL) 150 MG tablet Take 150 mg by mouth daily. Evelene Croon  . levothyroxine (SYNTHROID, LEVOTHROID) 100 MCG tablet Take 100 mcg by mouth daily before breakfast.  . LORazepam (ATIVAN) 1 MG tablet Take 1 mg by mouth daily as needed. Dr Evelene Croon  . meloxicam (MOBIC) 15 MG tablet Take 15 mg by mouth daily. ortho  . norethindrone-ethinyl estradiol (LOESTRIN) 1-20 MG-MCG tablet TAKE 1 TABLET DAILY (Patient taking differently: [facility])    PHQ 2/9 Scores 02/11/2021  PHQ - 2 Score 4  PHQ- 9 Score 17    GAD 7 : Generalized Anxiety Score 02/11/2021  Nervous, Anxious, on Edge 3  Control/stop worrying 2  Worry too much - different things 2  Trouble relaxing 3  Restless 2  Easily annoyed or irritable 3  Afraid - awful might happen 1  Total GAD 7 Score 16  Anxiety Difficulty Somewhat difficult     BP Readings from Last 3 Encounters:  03/05/21 120/76  02/11/21 (!) 140/100  12/25/20 130/83    Physical Exam Vitals and nursing note reviewed.  Constitutional:      Appearance: She is well-developed and well-nourished.  HENT:     Head: Normocephalic.     Right Ear: External ear normal.     Left Ear: External ear normal.     Mouth/Throat:     Mouth: Oropharynx is clear and moist. Mucous membranes are moist.  Eyes:     General: Lids are everted, no foreign bodies appreciated. No scleral icterus.       Left eye: No foreign body or hordeolum.     Extraocular Movements: EOM normal.     Conjunctiva/sclera: Conjunctivae normal.     Right eye: Right conjunctiva is not injected.     Left eye: Left conjunctiva is not injected.     Pupils: Pupils are equal, round, and reactive to light.  Neck:     Thyroid: No thyromegaly.     Vascular: No JVD.     Trachea: No tracheal deviation.  Cardiovascular:     Rate and Rhythm: Normal rate and regular rhythm.     Pulses: Intact distal pulses.     Heart sounds: Normal heart sounds. No murmur heard. No friction rub. No gallop.   Pulmonary:     Effort: Pulmonary effort is normal. No respiratory distress.     Breath  sounds: Normal breath sounds. No wheezing or rales.  Abdominal:     General: Bowel sounds are normal.     Palpations: Abdomen is soft. There is no hepatosplenomegaly or mass.     Tenderness: There is no abdominal tenderness. There is no guarding or rebound.  Musculoskeletal:        General: No tenderness or edema. Normal range of motion.     Cervical back: Normal range of motion and neck supple.     Right lower leg: No edema.  Lymphadenopathy:     Cervical: No cervical adenopathy.  Skin:    General: Skin is warm.     Findings: No rash.  Neurological:     Mental Status: She is alert and oriented to person, place, and time.     Cranial Nerves: No cranial nerve deficit.     Deep Tendon Reflexes: Strength normal. Reflexes  normal.  Psychiatric:        Mood and Affect: Mood and affect normal. Mood is not anxious or depressed.     Wt Readings from Last 3 Encounters:  03/05/21 156 lb (70.8 kg)  02/11/21 157 lb (71.2 kg)  12/25/20 153 lb (69.4 kg)    BP 120/76   Pulse 68   Ht 5\' 5"  (1.651 m)   Wt 156 lb (70.8 kg)   LMP 02/26/2021 (Approximate)   SpO2 98%   BMI 25.96 kg/m   Assessment and Plan: 1. Pneumonia due to COVID-19 virus New onset.  Recheck.  Patient is currently stable and gradually improving with still some cough and exertional dyspnea.  We will check chest x-ray to see what the current status is and continue to evaluate on a periodic basis. - DG Chest 2 View; Future  2. Tremor of both hands New problem.  Patient relates that she has been having tremors for years to the point that she has trouble with the shaking drinking glass.  Patient has concerns for this time we will refer to neurology for evaluation of tremor which is gradually worsening. - Ambulatory referral to Neurology  3. Congenital hypothyroidism without goiter Patient has a history of hypothyroidism for which she is on levothyroxine and we will recheck a TSH on next visit to determine if we continue at current dosing.

## 2021-03-18 DIAGNOSIS — Z79899 Other long term (current) drug therapy: Secondary | ICD-10-CM | POA: Diagnosis not present

## 2021-03-18 DIAGNOSIS — Z130 Encounter for screening for diseases of the blood and blood-forming organs and certain disorders involving the immune mechanism: Secondary | ICD-10-CM | POA: Diagnosis not present

## 2021-03-31 DIAGNOSIS — M2241 Chondromalacia patellae, right knee: Secondary | ICD-10-CM | POA: Diagnosis not present

## 2021-04-08 DIAGNOSIS — M2241 Chondromalacia patellae, right knee: Secondary | ICD-10-CM | POA: Diagnosis not present

## 2021-04-30 DIAGNOSIS — F3131 Bipolar disorder, current episode depressed, mild: Secondary | ICD-10-CM | POA: Diagnosis not present

## 2021-04-30 DIAGNOSIS — F3174 Bipolar disorder, in full remission, most recent episode manic: Secondary | ICD-10-CM | POA: Diagnosis not present

## 2021-04-30 DIAGNOSIS — F4321 Adjustment disorder with depressed mood: Secondary | ICD-10-CM | POA: Diagnosis not present

## 2021-05-14 DIAGNOSIS — E538 Deficiency of other specified B group vitamins: Secondary | ICD-10-CM | POA: Diagnosis not present

## 2021-05-14 DIAGNOSIS — E559 Vitamin D deficiency, unspecified: Secondary | ICD-10-CM | POA: Diagnosis not present

## 2021-05-14 DIAGNOSIS — G20 Parkinson's disease: Secondary | ICD-10-CM | POA: Diagnosis not present

## 2021-05-14 DIAGNOSIS — Z79899 Other long term (current) drug therapy: Secondary | ICD-10-CM | POA: Diagnosis not present

## 2021-05-19 ENCOUNTER — Other Ambulatory Visit (HOSPITAL_COMMUNITY): Payer: Self-pay | Admitting: Neurology

## 2021-05-19 ENCOUNTER — Other Ambulatory Visit: Payer: Self-pay | Admitting: Neurology

## 2021-05-19 DIAGNOSIS — G20 Parkinson's disease: Secondary | ICD-10-CM

## 2021-05-26 ENCOUNTER — Other Ambulatory Visit: Payer: Self-pay | Admitting: Obstetrics and Gynecology

## 2021-05-26 DIAGNOSIS — Z01419 Encounter for gynecological examination (general) (routine) without abnormal findings: Secondary | ICD-10-CM

## 2021-05-31 DIAGNOSIS — G20 Parkinson's disease: Secondary | ICD-10-CM | POA: Diagnosis not present

## 2021-06-03 ENCOUNTER — Ambulatory Visit (HOSPITAL_COMMUNITY): Payer: BC Managed Care – PPO

## 2021-06-04 ENCOUNTER — Ambulatory Visit (HOSPITAL_COMMUNITY)
Admission: RE | Admit: 2021-06-04 | Discharge: 2021-06-04 | Disposition: A | Discharge status: Home / Self Care | Payer: BC Managed Care – PPO | Source: Ambulatory Visit | Attending: Neurology | Admitting: Neurology

## 2021-06-04 ENCOUNTER — Other Ambulatory Visit: Payer: Self-pay

## 2021-06-04 DIAGNOSIS — G20 Parkinson's disease: Secondary | ICD-10-CM | POA: Diagnosis not present

## 2021-06-04 DIAGNOSIS — R278 Other lack of coordination: Secondary | ICD-10-CM | POA: Diagnosis not present

## 2021-06-04 IMAGING — MR MR HEAD W/O CM
16 of 25 series · 26 of 48 positions shown · non-contrast
Comparison: None.

CLINICAL DATA: Clumsiness and loss of balance

EXAM:
MRI HEAD WITHOUT CONTRAST
TECHNIQUE: Multiplanar, multiecho pulse sequences of the brain and surrounding
structures were obtained without intravenous contrast.
Additionally, using NeuroQuant software a 3D volumetric analysis of
the brain was performed and is compared to a normative database
adjusted for age, gender and intracranial volume.

[Series 2: DWI · axial · 3.0mm · 0.94mm/px · 1 of 99 slices shown (1 of 2)]
[im 1/99]
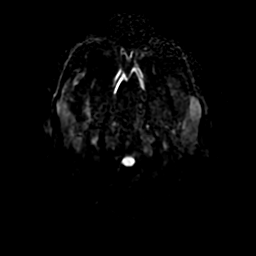

[Series 4: DWI · coronal · 4.0mm · 0.94mm/px · 1 of 70 slices shown (2 of 2)]
[im 1/70]
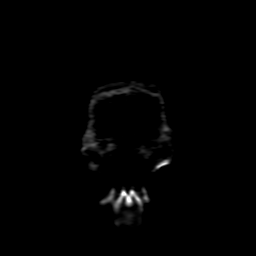

[Series 5: FLAIR · sagittal · 5.0mm · 0.23mm/px · 1 of 23 slices shown (1 of 2)]
[im 1/23]
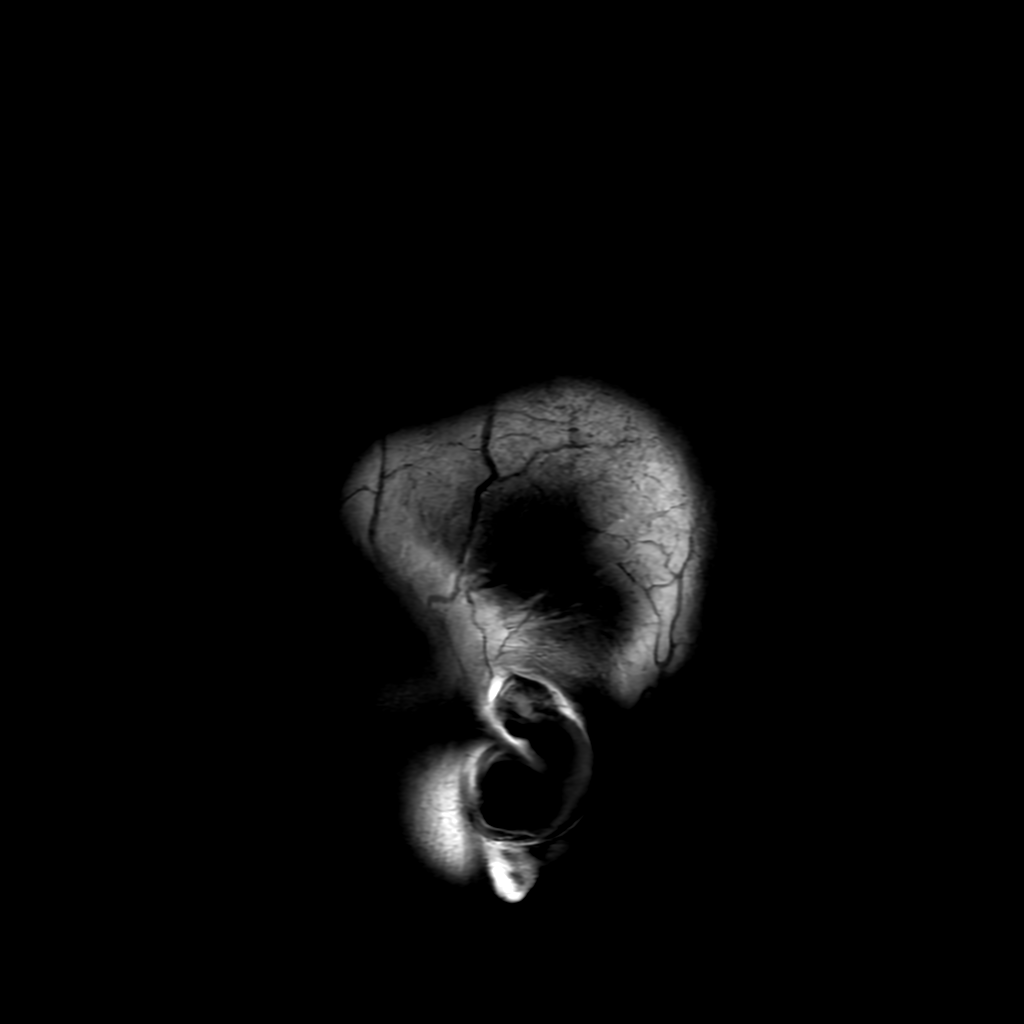

[Series 6: T2 · axial · 5.0mm · 0.23mm/px · 1 of 25 slices shown (1 of 2)]
[im 1/25]
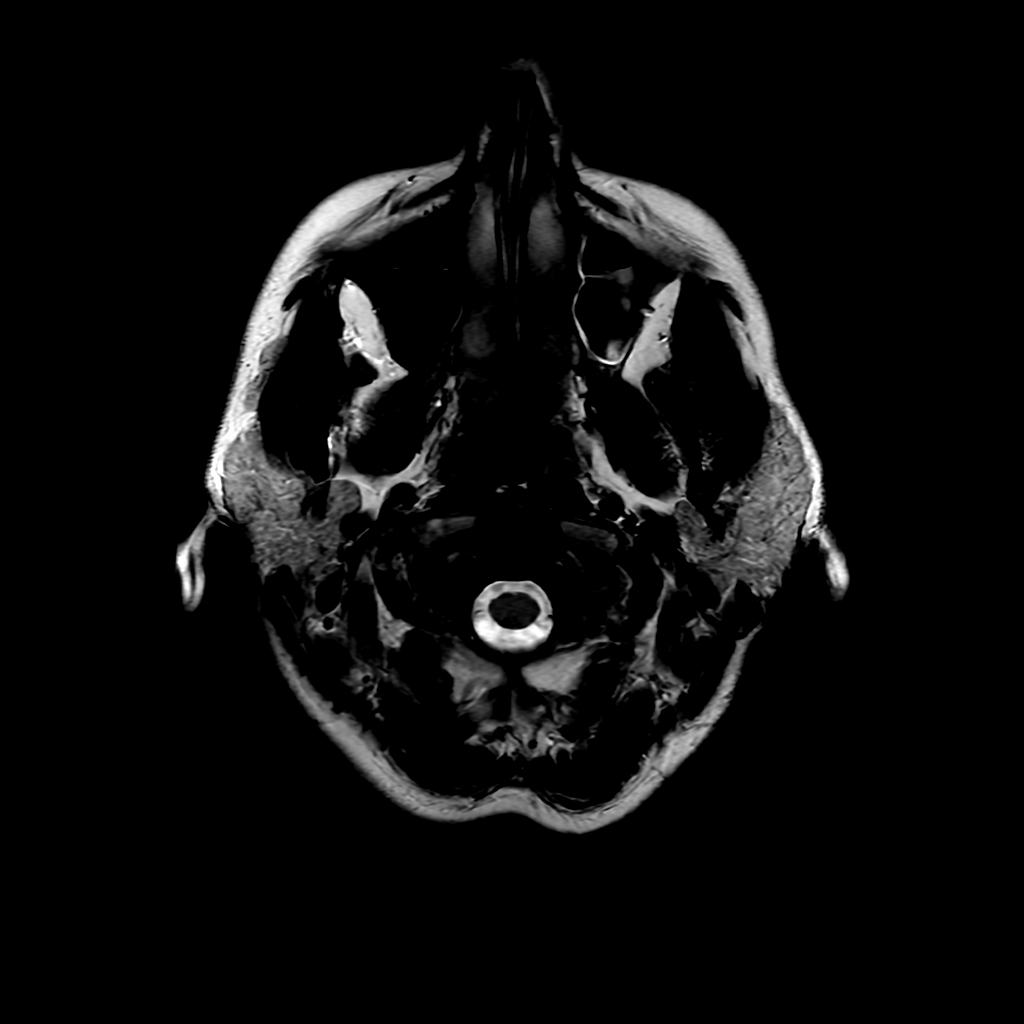

[Series 7: FLAIR · axial · 3.0mm · 0.45mm/px · 1 of 25 slices shown (2 of 2)]
[im 1/25]
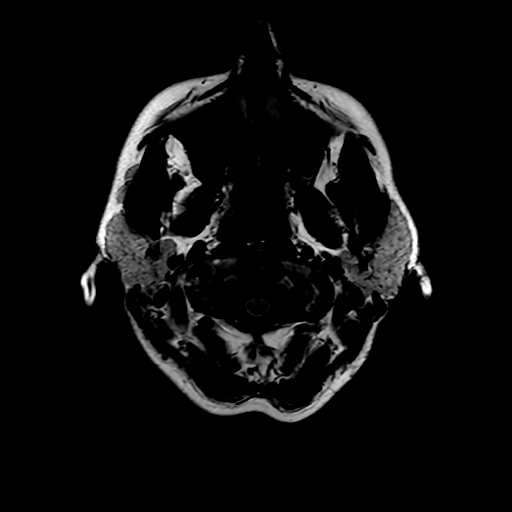

[Series 8: (person_name) · axial · 3.0mm · 0.47mm/px · 1 of 100 slices shown]
[im 1/100]
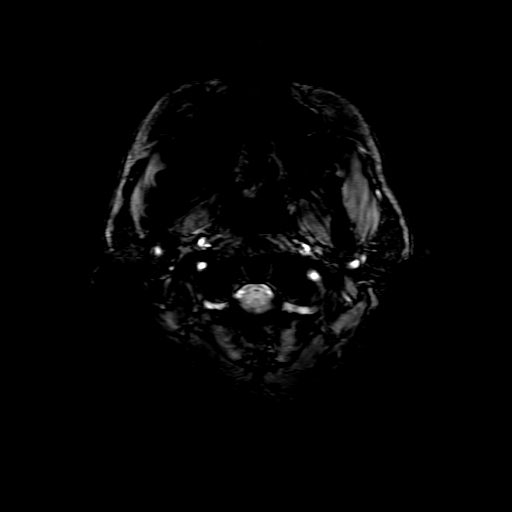

[Series 9: ax 3(person_name) pre · axial · non-contrast · 3.0mm · 0.94mm/px · 1 of 50 slices shown]
[im 1/50]
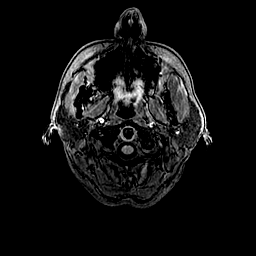

[Series 10: T2 · coronal · 5.0mm · 0.20mm/px · 1 of 29 slices shown (2 of 2)]
[im 1/29]
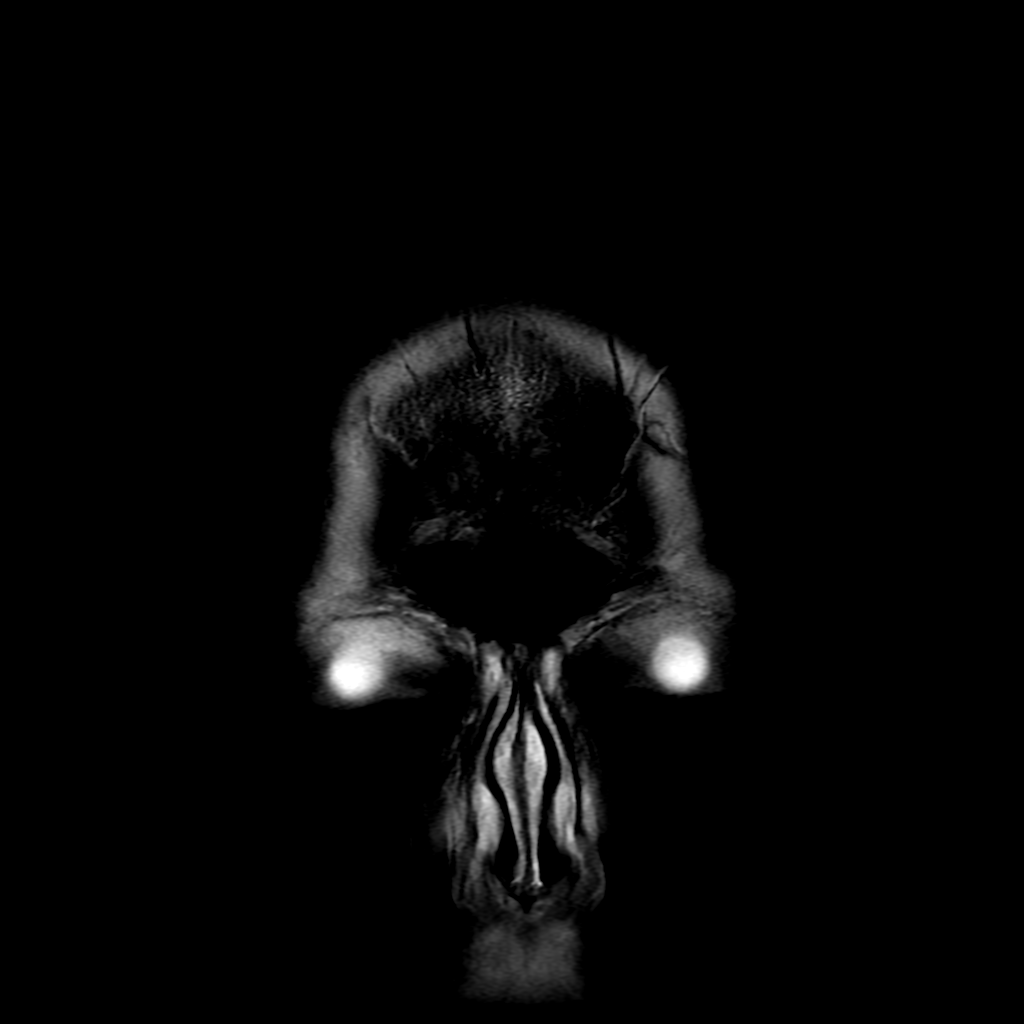

[Series 52: nqsegcorsc · 1.00mm/px · 2 of 232 slices shown (1 of 5)]
[im 1/232]
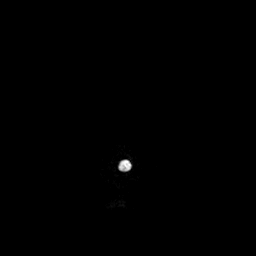
[im 232/232]
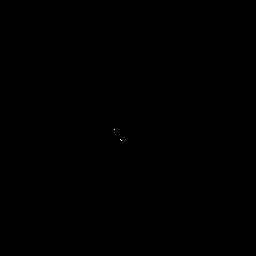

[Series 52: nqsegcorsc · 1.00mm/px · 2 of 232 slices shown (2 of 5)]
[im 1/232]
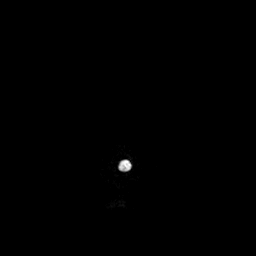
[im 232/232]
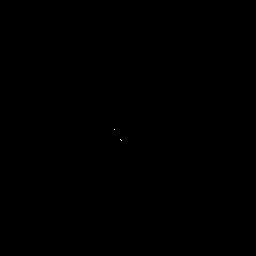

[Series 52: nqsegcorsc · 1.00mm/px · 3 of 232 slices shown (3 of 5)]
[im 1/232]
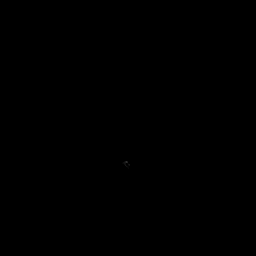
[im 116/232]
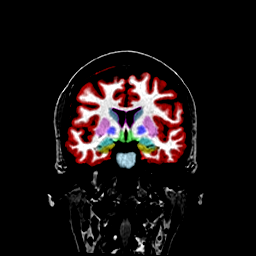
[im 232/232]
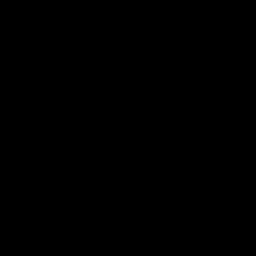

[Series 52: nqsegcorsc · 1.00mm/px · 3 of 232 slices shown (4 of 5)]
[im 1/232]
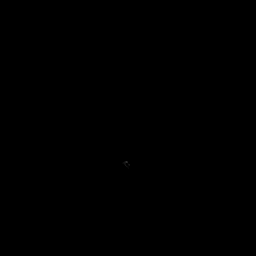
[im 116/232]
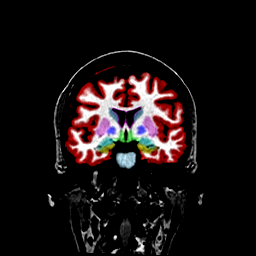
[im 232/232]
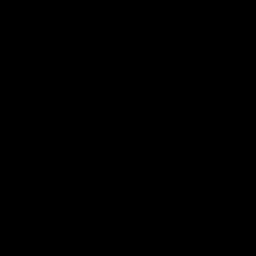

[Series 52: nqsegcorsc · 1.00mm/px · 3 of 232 slices shown (5 of 5)]
[im 1/232]
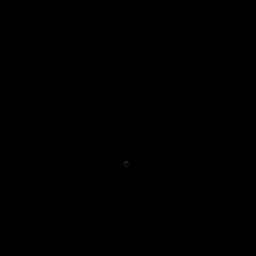
[im 116/232]
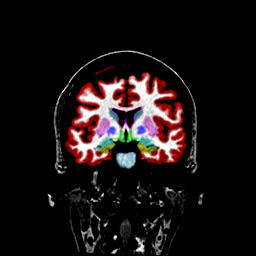
[im 232/232]
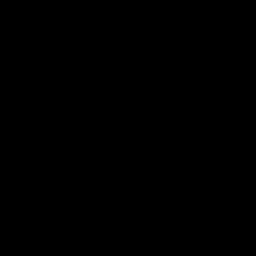

[Series 53: nqsegaxlsc · 1.00mm/px · 3 of 226 slices shown]
[im 1/226]
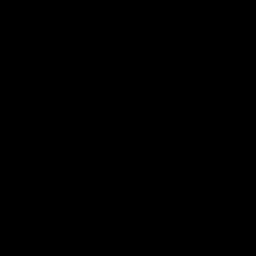
[im 113/226]
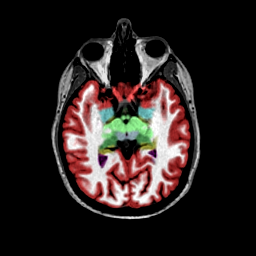
[im 226/226]
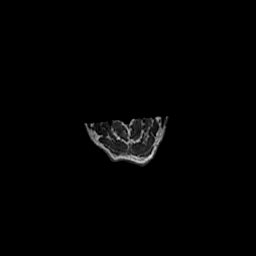

[Series 250: ADC · axial · 3.0mm · 0.94mm/px · 1 of 50 slices shown (1 of 2)]
[im 1/50]
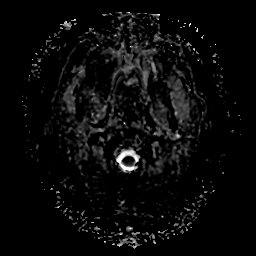

[Series 450: ADC · coronal · 4.0mm · 0.94mm/px · 1 of 34 slices shown (2 of 2)]
[im 1/34]
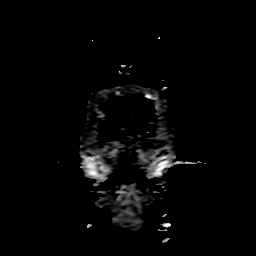

[26 of 48 positions shown; findings below may reference images not displayed]

FINDINGS: Brain: No gray matter infarction, hemorrhage, hydrocephalus,
extra-axial collection or mass lesion. No atrophy or generalized
white matter disease.

Vascular: Normal flow voids.

Skull and upper cervical spine: Normal marrow signal.

Sinuses/Orbits: Negative.

NeuroQuant Findings:

Volumetric analysis of the brain was performed, with a fully
detailed report in [HOSPITAL] PACS. Briefly, the comparison with age and
gender matched reference reveals age-appropriate brain volume.
IMPRESSION: 1. Negative brain MRI.
2. NeuroQuant volumetric analysis of the brain, see details on
[HOSPITAL] PACS.

## 2021-06-11 ENCOUNTER — Ambulatory Visit: Payer: BC Managed Care – PPO | Admitting: Family Medicine

## 2021-06-17 ENCOUNTER — Ambulatory Visit (INDEPENDENT_AMBULATORY_CARE_PROVIDER_SITE_OTHER): Payer: BC Managed Care – PPO | Admitting: Family Medicine

## 2021-06-17 ENCOUNTER — Encounter: Payer: Self-pay | Admitting: Family Medicine

## 2021-06-17 ENCOUNTER — Other Ambulatory Visit: Payer: Self-pay

## 2021-06-17 VITALS — BP 130/64 | HR 64 | Ht 65.0 in | Wt 153.0 lb

## 2021-06-17 DIAGNOSIS — E031 Congenital hypothyroidism without goiter: Secondary | ICD-10-CM | POA: Diagnosis not present

## 2021-06-17 MED ORDER — LEVOTHYROXINE SODIUM 100 MCG PO TABS: 100.0000 ug | ORAL_TABLET | Freq: Every day | ORAL | 1 refills | Status: DC

## 2021-06-17 NOTE — Progress Notes (Addendum)
Date:  06/17/2021   Name:  Stephanie Mcguire   DOB:  February 14, 1971   MRN:  [id number]   Chief Complaint: Hypothyroidism  Thyroid Problem Presents for follow-up visit. Patient reports no anxiety, cold intolerance, constipation, depressed mood, diaphoresis, diarrhea, dry skin, fatigue, hair loss, heat intolerance, hoarse voice, leg swelling, menstrual problem, nail problem, palpitations, tremors, visual change, weight gain or weight loss. The symptoms have been stable.   Lab Results  Component Value Date   CREATININE 0.83 12/25/2020   BUN 7 12/25/2020   NA 133 (L) 12/25/2020   K 3.7 12/25/2020   CL 97 (L) 12/25/2020   CO2 27 12/25/2020   No results found for: CHOL, HDL, LDLCALC, LDLDIRECT, TRIG, CHOLHDL No results found for: TSH No results found for: HGBA1C Lab Results  Component Value Date   WBC 6.4 12/25/2020   HGB 14.7 12/25/2020   HCT 43.2 12/25/2020   MCV 93.5 12/25/2020   PLT 268 12/25/2020   No results found for: ALT, AST, GGT, ALKPHOS, BILITOT   Review of Systems  Constitutional:  Negative for chills, diaphoresis, fatigue, fever, weight gain and weight loss.  HENT:  Negative for drooling, ear discharge, ear pain, hoarse voice and sore throat.   Respiratory:  Negative for cough, shortness of breath and wheezing.   Cardiovascular:  Negative for chest pain, palpitations and leg swelling.  Gastrointestinal:  Negative for abdominal pain, blood in stool, constipation, diarrhea and nausea.  Endocrine: Negative for cold intolerance, heat intolerance and polydipsia.  Genitourinary:  Negative for dysuria, frequency, hematuria, menstrual problem and urgency.  Musculoskeletal:  Negative for back pain, myalgias and neck pain.  Skin:  Negative for rash.  Allergic/Immunologic: Negative for environmental allergies.  Neurological:  Negative for dizziness, tremors and headaches.  Hematological:  Does not bruise/bleed easily.  Psychiatric/Behavioral:  Negative for suicidal ideas. The  patient is not nervous/anxious.    Patient Active Problem List   Diagnosis Date Noted   Lumbar radiculopathy 10/17/2020   Patellofemoral arthritis of right knee 10/01/2020   Acute medial meniscal tear, right, initial encounter 08/02/2020   Interstitial lung disease (HCC) 10/14/2015   Dyspnea on exertion 10/14/2015   Congenital hypothyroidism without goiter 10/14/2015   Abnormal chest CT 10/14/2015   Essential vulvodynia 12/10/2011    No Known Allergies  Past Surgical History:  Procedure Laterality Date   BUNIONECTOMY     MANDIBLE SURGERY  2009   RECONSTRUCTION OF EYELID  1989 and 1990    Social History   Tobacco Use   Smoking status: Never   Smokeless tobacco: Never  Vaping Use   Vaping Use: Never used  Substance Use Topics   Alcohol use: Yes    Alcohol/week: 2.0 standard drinks    Types: 2 Glasses of wine per week   Drug use: No     Medication list has been reviewed and updated.  Current Meds  Medication Sig   albuterol (VENTOLIN HFA) 108 (90 Base) MCG/ACT inhaler Inhale into the lungs.   carbamazepine (TEGRETOL) 200 MG tablet 800 mg daily. Dr Evelene Croon   cetirizine-pseudoephedrine (ZYRTEC-D) 5-120 MG tablet Take 1 tablet by mouth 2 (two) times daily as needed for allergies.   cyanocobalamin (,VITAMIN B-12,) 1000 MCG/ML injection Inject into the muscle. neurology   ergocalciferol (VITAMIN D2) 1.25 MG (50000 UT) capsule Take 1 capsule by mouth once a week. neuro   fluticasone (FLONASE) 50 MCG/ACT nasal spray Place 2 sprays into both nostrils daily.   lamoTRIgine (LAMICTAL) 200 MG tablet  Take 200 mg by mouth daily. Kaur   levothyroxine (SYNTHROID, LEVOTHROID) 100 MCG tablet Take 100 mcg by mouth daily before breakfast.   LORazepam (ATIVAN) 1 MG tablet Take 1 mg by mouth daily as needed. Dr Evelene Croon   norethindrone-ethinyl estradiol (LOESTRIN) 1-20 MG-MCG tablet TAKE 1 TABLET DAILY (Patient taking differently: [facility])   [DISCONTINUED] meloxicam (MOBIC) 15 MG tablet  Take 15 mg by mouth daily. ortho    PHQ 2/9 Scores 06/17/2021 02/11/2021  PHQ - 2 Score 0 4  PHQ- 9 Score 0 17    GAD 7 : Generalized Anxiety Score 06/17/2021 02/11/2021  Nervous, Anxious, on Edge 0 3  Control/stop worrying 0 2  Worry too much - different things 0 2  Trouble relaxing 0 3  Restless 0 2  Easily annoyed or irritable 0 3  Afraid - awful might happen 0 1  Total GAD 7 Score 0 16  Anxiety Difficulty - Somewhat difficult    BP Readings from Last 3 Encounters:  06/17/21 130/64  03/05/21 130/87  02/11/21 (!) 140/100    Physical Exam Vitals and nursing note reviewed.  Constitutional:      General: She is not in acute distress.    Appearance: She is not diaphoretic.  HENT:     Head: Normocephalic and atraumatic.     Right Ear: Tympanic membrane, ear canal and external ear normal. There is no impacted cerumen.     Left Ear: Tympanic membrane, ear canal and external ear normal. There is no impacted cerumen.     Nose: Nose normal. No congestion or rhinorrhea.  Eyes:     General:        Right eye: No discharge.        Left eye: No discharge.     Conjunctiva/sclera: Conjunctivae normal.     Pupils: Pupils are equal, round, and reactive to light.  Neck:     Thyroid: No thyromegaly.     Vascular: No JVD.  Cardiovascular:     Rate and Rhythm: Normal rate and regular rhythm.     Heart sounds: Normal heart sounds. No murmur heard.   No friction rub. No gallop.  Pulmonary:     Effort: Pulmonary effort is normal.     Breath sounds: Normal breath sounds.  Abdominal:     General: Bowel sounds are normal.     Palpations: Abdomen is soft. There is no mass.     Tenderness: There is no abdominal tenderness. There is no guarding.  Musculoskeletal:        General: Normal range of motion.     Cervical back: Normal range of motion and neck supple.  Lymphadenopathy:     Cervical: No cervical adenopathy.  Skin:    General: Skin is warm and dry.  Neurological:     Mental  Status: She is alert.     Deep Tendon Reflexes: Reflexes are normal and symmetric.    Wt Readings from Last 3 Encounters:  06/17/21 153 lb (69.4 kg)  03/05/21 156 lb (70.8 kg)  02/11/21 157 lb (71.2 kg)    BP 130/64   Pulse 64   Ht 5\' 5"  (1.651 m)   Wt 153 lb (69.4 kg)   BMI 25.46 kg/m   Assessment and Plan:   1. Congenital hypothyroidism without goiter Chronic.  Controlled.  Stable.  Review of TSH that was done at [facility] was in acceptable range.  We will continue levothyroxine at 100 mcg daily.  We will recheck in 6  months. - levothyroxine (SYNTHROID) 100 MCG tablet; Take 1 tablet (100 mcg total) by mouth daily before breakfast.  Dispense: 90 tablet; Refill: 1

## 2021-06-17 NOTE — Patient Instructions (Signed)

## 2021-07-02 DIAGNOSIS — F3176 Bipolar disorder, in full remission, most recent episode depressed: Secondary | ICD-10-CM | POA: Diagnosis not present

## 2021-07-02 DIAGNOSIS — F3174 Bipolar disorder, in full remission, most recent episode manic: Secondary | ICD-10-CM | POA: Diagnosis not present

## 2021-07-02 DIAGNOSIS — G4700 Insomnia, unspecified: Secondary | ICD-10-CM | POA: Diagnosis not present

## 2021-07-04 DIAGNOSIS — G20 Parkinson's disease: Secondary | ICD-10-CM | POA: Diagnosis not present

## 2021-07-08 ENCOUNTER — Encounter: Payer: Self-pay | Admitting: Family Medicine

## 2021-08-01 ENCOUNTER — Encounter: Payer: Self-pay | Admitting: Family Medicine

## 2021-08-01 ENCOUNTER — Ambulatory Visit (INDEPENDENT_AMBULATORY_CARE_PROVIDER_SITE_OTHER): Payer: BC Managed Care – PPO | Admitting: Family Medicine

## 2021-08-01 ENCOUNTER — Telehealth: Payer: Self-pay

## 2021-08-01 ENCOUNTER — Other Ambulatory Visit: Payer: Self-pay

## 2021-08-01 VITALS — BP 124/80 | HR 80 | Ht 65.0 in | Wt 142.0 lb

## 2021-08-01 DIAGNOSIS — J849 Interstitial pulmonary disease, unspecified: Secondary | ICD-10-CM

## 2021-08-01 DIAGNOSIS — G20 Parkinson's disease: Secondary | ICD-10-CM

## 2021-08-01 MED ORDER — ALBUTEROL SULFATE HFA 108 (90 BASE) MCG/ACT IN AERS: 2.0000 | INHALATION_SPRAY | Freq: Four times a day (QID) | RESPIRATORY_TRACT | 5 refills | Status: DC | PRN

## 2021-08-01 NOTE — Telephone Encounter (Signed)
Copied from CRM [id number]. Topic: Referral - Request for Referral >> Aug 01, 2021  9:37 AM Crist Infante wrote: Has patient seen PCP for this complaint? Yes.   *If NO, is insurance requiring patient see PCP for this issue before PCP can refer them? Referral for which specialty:  Neurology Preferred provider/office:  Dr Raquel Sarna [facility] Reason for referral: second opinion (Dr Clelia Croft diagnosed her with parkinson's) Please call pt when this is done so she can make her appt

## 2021-08-01 NOTE — Progress Notes (Signed)
Date:  08/01/2021   Name:  Stephanie Mcguire   DOB:  Feb 25, 1971   MRN:  [id number]   Chief Complaint: medical consultation (Parkinson's?)  HPI  Lab Results  Component Value Date   CREATININE 0.83 12/25/2020   BUN 7 12/25/2020   NA 133 (L) 12/25/2020   K 3.7 12/25/2020   CL 97 (L) 12/25/2020   CO2 27 12/25/2020   No results found for: CHOL, HDL, LDLCALC, LDLDIRECT, TRIG, CHOLHDL No results found for: TSH No results found for: HGBA1C Lab Results  Component Value Date   WBC 6.4 12/25/2020   HGB 14.7 12/25/2020   HCT 43.2 12/25/2020   MCV 93.5 12/25/2020   PLT 268 12/25/2020   No results found for: ALT, AST, GGT, ALKPHOS, BILITOT   Review of Systems  Patient Active Problem List   Diagnosis Date Noted   Lumbar radiculopathy 10/17/2020   Patellofemoral arthritis of right knee 10/01/2020   Acute medial meniscal tear, right, initial encounter 08/02/2020   Interstitial lung disease (HCC) 10/14/2015   Dyspnea on exertion 10/14/2015   Congenital hypothyroidism without goiter 10/14/2015   Abnormal chest CT 10/14/2015   Essential vulvodynia 12/10/2011    No Known Allergies  Past Surgical History:  Procedure Laterality Date   BUNIONECTOMY     MANDIBLE SURGERY  2009   RECONSTRUCTION OF EYELID  1989 and 1990    Social History   Tobacco Use   Smoking status: Never   Smokeless tobacco: Never  Vaping Use   Vaping Use: Never used  Substance Use Topics   Alcohol use: Yes    Alcohol/week: 2.0 standard drinks    Types: 2 Glasses of wine per week   Drug use: No     Medication list has been reviewed and updated.  Current Meds  Medication Sig   albuterol (VENTOLIN HFA) 108 (90 Base) MCG/ACT inhaler Inhale into the lungs.   carbamazepine (TEGRETOL) 200 MG tablet 800 mg daily. Dr Evelene Croon   cyanocobalamin (,VITAMIN B-12,) 1000 MCG/ML injection Inject into the muscle. neurology   Ergocalciferol 50 MCG (2000 UT) TABS Take 1 capsule by mouth daily. neuro   fluticasone  (FLONASE) 50 MCG/ACT nasal spray Place 2 sprays into both nostrils daily.   lamoTRIgine (LAMICTAL) 200 MG tablet Take 200 mg by mouth daily. Evelene Croon   levothyroxine (SYNTHROID) 100 MCG tablet Take 1 tablet (100 mcg total) by mouth daily before breakfast.   LORazepam (ATIVAN) 1 MG tablet Take 1 mg by mouth daily as needed. Dr Evelene Croon   norethindrone-ethinyl estradiol (LOESTRIN) 1-20 MG-MCG tablet TAKE 1 TABLET DAILY (Patient taking differently: [facility])    PHQ 2/9 Scores 06/17/2021 02/11/2021  PHQ - 2 Score 0 4  PHQ- 9 Score 0 17    GAD 7 : Generalized Anxiety Score 06/17/2021 02/11/2021  Nervous, Anxious, on Edge 0 3  Control/stop worrying 0 2  Worry too much - different things 0 2  Trouble relaxing 0 3  Restless 0 2  Easily annoyed or irritable 0 3  Afraid - awful might happen 0 1  Total GAD 7 Score 0 16  Anxiety Difficulty - Somewhat difficult    BP Readings from Last 3 Encounters:  08/01/21 124/80  06/17/21 130/64  03/05/21 130/87    Physical Exam  Wt Readings from Last 3 Encounters:  08/01/21 142 lb (64.4 kg)  06/17/21 153 lb (69.4 kg)  03/05/21 156 lb (70.8 kg)    BP 124/80   Pulse 80   Ht 5'  5" (1.651 m)   Wt 142 lb (64.4 kg)   BMI 23.63 kg/m   Assessment and Plan:  1. Parkinson's disease (HCC) New onset diagnosis.  Patient has been seen by Dr. Lavonne Chick requiring neurology and has had confirmatory studies including skin biopsy.  Patient would like a second opinion but also would like to have movement clinic involved with her diagnosis as well.  We will place a referral to Dr. Wilber Oliphant of movement clinic at [facility] to determine if patient is a candidate.  Information is available on epic from Dr. Sherryll Burger evaluation at [facility] clinic. - Ambulatory referral to Neurology  2. Interstitial lung disease (HCC) Chronic.  Controlled.  Stable.  Patient would like a refill on her albuterol and refills have been placed for 2 puffs every 6 hours as needed for wheezing or  shortness of breath. - albuterol (VENTOLIN HFA) 108 (90 Base) MCG/ACT inhaler; Inhale 2 puffs into the lungs every 6 (six) hours as needed for wheezing or shortness of breath.  Dispense: 18 g; Refill: 5

## 2021-08-02 DIAGNOSIS — Z23 Encounter for immunization: Secondary | ICD-10-CM | POA: Diagnosis not present

## 2021-08-04 ENCOUNTER — Encounter: Payer: Self-pay | Admitting: Family Medicine

## 2021-08-06 ENCOUNTER — Telehealth: Payer: Self-pay

## 2021-08-06 NOTE — Telephone Encounter (Signed)
Copied from CRM [id number]. Topic: General - Other >> Aug 06, 2021  9:42 AM Gaetana Michaelis A wrote: Reason for CRM: Kisha with [facility] neurology has called requesting the most recent office notes of the patient and any notes related to their parkinson's concerns be faxed to [phone]   The patient has an upcoming appointment   Please contact further if needed

## 2021-08-06 NOTE — Telephone Encounter (Signed)
Notes printed and faxed.  KP

## 2021-08-13 DIAGNOSIS — E559 Vitamin D deficiency, unspecified: Secondary | ICD-10-CM | POA: Diagnosis not present

## 2021-08-13 DIAGNOSIS — E538 Deficiency of other specified B group vitamins: Secondary | ICD-10-CM | POA: Diagnosis not present

## 2021-08-20 DIAGNOSIS — E038 Other specified hypothyroidism: Secondary | ICD-10-CM | POA: Diagnosis not present

## 2021-08-20 DIAGNOSIS — G3189 Other specified degenerative diseases of nervous system: Secondary | ICD-10-CM | POA: Diagnosis not present

## 2021-09-08 ENCOUNTER — Telehealth: Payer: Self-pay

## 2021-09-08 NOTE — Telephone Encounter (Signed)
Called pt let her know that her tdap was 11/12/2016 and she is due 11/12/2026. Pt cut her arm while moving she stated the object was not rusted. Told pt she should be ok. Pt verbalized understanding.  KP

## 2021-09-08 NOTE — Telephone Encounter (Signed)
Copied from CRM [id number]. Topic: General - Other >> Sep 08, 2021  2:00 PM Pawlus, Maxine Glenn A wrote: Reason for CRM: Pt wanted to know when she had her last tetanus shot and when she is due for her next one. Please advise.

## 2021-09-10 ENCOUNTER — Other Ambulatory Visit: Payer: Self-pay | Admitting: Family Medicine

## 2021-09-10 DIAGNOSIS — J849 Interstitial pulmonary disease, unspecified: Secondary | ICD-10-CM

## 2021-09-10 MED ORDER — ALBUTEROL SULFATE HFA 108 (90 BASE) MCG/ACT IN AERS: 2.0000 | INHALATION_SPRAY | Freq: Four times a day (QID) | RESPIRATORY_TRACT | 1 refills | Status: DC | PRN

## 2021-09-10 NOTE — Telephone Encounter (Signed)
Patient request 90 day supply for cheaper cost- Rx adjusted within original supply given

## 2021-09-10 NOTE — Telephone Encounter (Signed)
Copied from CRM [id number]. Topic: Quick Communication - Rx Refill/Question >> Sep 10, 2021  2:57 PM Jaquita Rector A wrote: Medication: albuterol (VENTOLIN HFA) 108 (90 Base) MCG/ACT inhaler  Asking if Rx can be for 3 month supply since its cheaper for the insurance  Has the patient contacted their pharmacy? Yes.   (Agent: If no, request that the patient contact the pharmacy for the refill.) (Agent: If yes, when and what did the pharmacy advise?)  Preferred Pharmacy (with phone number or street name): [facility] - [place], [place] - [address]  Phone:  [phone] Fax:  [phone]     Agent: Please be advised that RX refills may take up to 3 business days. We ask that you follow-up with your pharmacy.

## 2021-09-15 ENCOUNTER — Telehealth: Payer: Self-pay

## 2021-09-15 DIAGNOSIS — Z01419 Encounter for gynecological examination (general) (routine) without abnormal findings: Secondary | ICD-10-CM

## 2021-09-15 MED ORDER — NORETHINDRONE ACET-ETHINYL EST 1-20 MG-MCG PO TABS: 1.0000 | ORAL_TABLET | Freq: Every day | ORAL | 0 refills | Status: DC

## 2021-09-15 NOTE — Telephone Encounter (Signed)
Pt calling; has scheduled appt for 10/02/21; needs refill of bc.  [phone]  Pt states needs rx sent to [facility]; has one week left of current pack.  ADV will eRx as soon as we hang up.

## 2021-09-19 ENCOUNTER — Telehealth: Payer: Self-pay

## 2021-09-19 NOTE — Telephone Encounter (Signed)
Called pt and left message for her to return call concerning colonoscopy vs FIT test

## 2021-09-29 DIAGNOSIS — L820 Inflamed seborrheic keratosis: Secondary | ICD-10-CM | POA: Diagnosis not present

## 2021-09-29 DIAGNOSIS — D485 Neoplasm of uncertain behavior of skin: Secondary | ICD-10-CM | POA: Diagnosis not present

## 2021-09-29 DIAGNOSIS — D2371 Other benign neoplasm of skin of right lower limb, including hip: Secondary | ICD-10-CM | POA: Diagnosis not present

## 2021-09-29 DIAGNOSIS — L821 Other seborrheic keratosis: Secondary | ICD-10-CM | POA: Diagnosis not present

## 2021-10-02 ENCOUNTER — Ambulatory Visit: Payer: BC Managed Care – PPO | Admitting: Obstetrics and Gynecology

## 2021-10-02 ENCOUNTER — Other Ambulatory Visit: Payer: Self-pay

## 2021-10-09 DIAGNOSIS — Z6824 Body mass index (BMI) 24.0-24.9, adult: Secondary | ICD-10-CM | POA: Diagnosis not present

## 2021-10-09 DIAGNOSIS — R29898 Other symptoms and signs involving the musculoskeletal system: Secondary | ICD-10-CM | POA: Diagnosis not present

## 2021-10-09 DIAGNOSIS — G20 Parkinson's disease: Secondary | ICD-10-CM | POA: Diagnosis not present

## 2021-10-30 ENCOUNTER — Other Ambulatory Visit: Payer: Self-pay

## 2021-10-30 ENCOUNTER — Ambulatory Visit (INDEPENDENT_AMBULATORY_CARE_PROVIDER_SITE_OTHER): Payer: BC Managed Care – PPO | Admitting: Obstetrics and Gynecology

## 2021-10-30 ENCOUNTER — Encounter: Payer: Self-pay | Admitting: Obstetrics and Gynecology

## 2021-10-30 VITALS — BP 122/70 | Ht 65.0 in | Wt 140.0 lb

## 2021-10-30 DIAGNOSIS — Z1331 Encounter for screening for depression: Secondary | ICD-10-CM

## 2021-10-30 DIAGNOSIS — Z1211 Encounter for screening for malignant neoplasm of colon: Secondary | ICD-10-CM | POA: Diagnosis not present

## 2021-10-30 DIAGNOSIS — Z1339 Encounter for screening examination for other mental health and behavioral disorders: Secondary | ICD-10-CM | POA: Diagnosis not present

## 2021-10-30 DIAGNOSIS — Z01419 Encounter for gynecological examination (general) (routine) without abnormal findings: Secondary | ICD-10-CM | POA: Diagnosis not present

## 2021-10-30 MED ORDER — NORETHINDRONE ACET-ETHINYL EST 1-20 MG-MCG PO TABS: 1.0000 | ORAL_TABLET | Freq: Every day | ORAL | 3 refills | Status: DC

## 2021-10-30 NOTE — Patient Instructions (Signed)
[facility] in [place], [place] Address: First Floor - [facility], [address], [place], [place] [address] Phone: [phone]  

## 2021-10-30 NOTE — Progress Notes (Signed)
Gynecology Annual Exam  PCP: Duanne Limerick, MD  Chief Complaint  Patient presents with   Annual Exam   History of Present Illness:  Ms. Stephanie Mcguire is a 50 y.o. G0P0000 who LMP was No LMP recorded. (Menstrual status: Other)., presents today for her annual examination.  Her menses are irregular every 28-30 days, lasting 2-3 day(s).  Dysmenorrhea none. She does not have intermenstrual bleeding. Some months she doesn't have any bleeding.  She only has to use a panty liner when she has whatever bleeding she has.   She does have vasomotor sx. She says that her hot flashes have gotten better.   She is not very sexually activeShe does not have vaginal dryness.  Last Pap: 12/26/2018  Results were: no abnormalities /HPV DNA.negative Hx of STDs: none  Last mammogram: 5 years  Results were: normal--routine follow-up in 12 months There is no FH of breast cancer. There is a  FH of ovarian cancer in her paternal grandmother. The patient does do self-breast exams.  Colonoscopy: has never had DEXA: has not been screened for osteoporosis  Tobacco use: The patient denies current or previous tobacco use. Alcohol use: social drinker Exercise: not much  The patient wears seatbelts: yes.     Past Medical History:  Diagnosis Date   Anxiety    Arthritis    spondylosis   Asthma    Bipolar 2 disorder (HCC)    Bipolar disease, chronic (HCC)    Depression    Thyroid disease     Past Surgical History:  Procedure Laterality Date   BUNIONECTOMY     MANDIBLE SURGERY  2009   RECONSTRUCTION OF EYELID  1989 and 1990   Prior to Admission medications   Medication Sig Start Date End Date Taking? Authorizing Provider  albuterol (VENTOLIN HFA) 108 (90 Base) MCG/ACT inhaler Inhale 2 puffs into the lungs every 6 (six) hours as needed for wheezing or shortness of breath. 09/10/21  Yes Duanne Limerick, MD  carbamazepine (TEGRETOL) 200 MG tablet 800 mg daily. Dr Evelene Croon 02/03/21  Yes [provider]   cetirizine-pseudoephedrine (ZYRTEC-D) 5-120 MG tablet Take 1 tablet by mouth 2 (two) times daily as needed for allergies. 08/12/16  Yes Hassan Rowan, MD  cyanocobalamin (,VITAMIN B-12,) 1000 MCG/ML injection Inject into the muscle. neurology 05/20/21  Yes [provider]  Ergocalciferol 50 MCG (2000 UT) TABS Take 1 capsule by mouth daily. neuro 05/20/21  Yes [provider]  fluticasone (FLONASE) 50 MCG/ACT nasal spray Place 2 sprays into both nostrils daily. 08/12/16  Yes Hassan Rowan, MD  lamoTRIgine (LAMICTAL) 200 MG tablet Take 200 mg by mouth daily. Evelene Croon 03/01/21  Yes [provider]  levothyroxine (SYNTHROID) 100 MCG tablet Take 1 tablet (100 mcg total) by mouth daily before breakfast. 06/17/21  Yes Duanne Limerick, MD  LORazepam (ATIVAN) 1 MG tablet Take 1 mg by mouth daily as needed. Dr Evelene Croon 01/31/21  Yes [provider]  norethindrone-ethinyl estradiol (LOESTRIN) 1-20 MG-MCG tablet Take 1 tablet by mouth daily. 09/15/21  Yes Conard Novak, MD     Allergies: No Known Allergies  Obstetric History: G0P0000  Family History  Problem Relation Age of Onset   Parkinson's disease Mother    Hypertension Father    Stroke Maternal Grandmother     Social History   Socioeconomic History   Marital status: Married    Spouse name: Not on file   Number of children: Not on file   Years of education:  Not on file   Highest education level: Not on file  Occupational History   Not on file  Tobacco Use   Smoking status: Never   Smokeless tobacco: Never  Vaping Use   Vaping Use: Never used  Substance and Sexual Activity   Alcohol use: Yes    Alcohol/week: 2.0 standard drinks    Types: 2 Glasses of wine per week   Drug use: No   Sexual activity: Yes    Birth control/protection: Pill  Other Topics Concern   Not on file  Social History Narrative   Not on file   Social Determinants of Health   Financial Resource Strain: Not on file  Food Insecurity: Not  on file  Transportation Needs: Not on file  Physical Activity: Not on file  Stress: Not on file  Social Connections: Not on file  Intimate Partner Violence: Not on file    Review of Systems  Constitutional:  Positive for malaise/fatigue. Negative for chills, diaphoresis, fever and weight loss.       Hot flashes  HENT:  Positive for congestion. Negative for ear discharge, ear pain, hearing loss, nosebleeds, sinus pain, sore throat and tinnitus.   Eyes: Negative.   Respiratory: Negative.  Negative for stridor.   Cardiovascular: Negative.   Gastrointestinal: Negative.   Genitourinary: Negative.   Musculoskeletal: Negative.   Skin: Negative.   Neurological:  Positive for tingling and headaches. Negative for dizziness, tremors, sensory change, speech change, focal weakness, seizures, loss of consciousness and weakness.  Psychiatric/Behavioral:  Positive for depression. Negative for hallucinations, memory loss, substance abuse and suicidal ideas. The patient is nervous/anxious. The patient does not have insomnia.     Physical Exam BP 122/70   Ht 5\' 5"  (1.651 m)   Wt 140 lb (63.5 kg)   BMI 23.30 kg/m   Physical Exam Constitutional:      General: She is not in acute distress.    Appearance: Normal appearance. She is well-developed.  Genitourinary:     Vulva and bladder normal.     No vaginal discharge, erythema, tenderness or bleeding.      Right Adnexa: not tender, not full and no mass present.    Left Adnexa: not tender, not full and no mass present.    No cervical motion tenderness, discharge, lesion or polyp.     Uterus is not enlarged or tender.     No uterine mass detected. Breasts:    Right: No inverted nipple, mass, nipple discharge, skin change or tenderness.     Left: No inverted nipple, mass, nipple discharge, skin change or tenderness.  HENT:     Head: Normocephalic and atraumatic.  Eyes:     General: No scleral icterus.    Conjunctiva/sclera: Conjunctivae normal.   Neck:     Thyroid: No thyromegaly.  Cardiovascular:     Rate and Rhythm: Normal rate and regular rhythm.     Heart sounds: No murmur heard.   No friction rub. No gallop.  Pulmonary:     Effort: Pulmonary effort is normal. No respiratory distress.     Breath sounds: Normal breath sounds. No wheezing or rales.  Abdominal:     General: Bowel sounds are normal. There is no distension.     Palpations: Abdomen is soft. There is no mass.     Tenderness: There is no abdominal tenderness. There is no guarding or rebound.  Musculoskeletal:        General: No swelling or tenderness. Normal range of motion.  Cervical back: Normal range of motion and neck supple.  Lymphadenopathy:     Cervical: No cervical adenopathy.     Lower Body: No right inguinal adenopathy. No left inguinal adenopathy.  Neurological:     General: No focal deficit present.     Mental Status: She is alert and oriented to person, place, and time.     Cranial Nerves: No cranial nerve deficit.  Skin:    General: Skin is warm and dry.     Findings: No erythema or rash.  Psychiatric:        Mood and Affect: Mood normal.        Behavior: Behavior normal.        Judgment: Judgment normal.    Female chaperone present for pelvic and breast  portions of the physical exam  Results: AUDIT Questionnaire (screen for alcoholism): 1 PHQ-9: 12  Assessment: 50 y.o. G0P0000 female here for routine gynecologic examination.  Plan: Problem List Items Addressed This Visit   None Visit Diagnoses     Women's annual routine gynecological examination    -  Primary   Relevant Orders   Cologuard   Screening for depression       Screening for alcoholism       Screen for colon cancer       Relevant Orders   Cologuard      Screening: -- Blood pressure screen normal -- Colonoscopy -  due. Cologuard ordered -- Mammogram - due. Patient to call [facility] to arrange. She understands that it is her responsibility to arrange this. --  Weight screening: normal -- Depression screening negative (PHQ-9) -- Nutrition: normal -- cholesterol screening: per PCP -- osteoporosis screening: not due -- tobacco screening: not using -- alcohol screening: AUDIT questionnaire indicates low-risk usage. -- family history of breast cancer screening: done. not at high risk. -- no evidence of domestic violence or intimate partner violence. -- STD screening: gonorrhea/chlamydia NAAT not collected per patient request. -- pap smear not collected per ASCCP guidelines -- flu vaccine  received -- HPV vaccination series: not eligilbe  Thomasene Mohair, MD 10/30/2021 2:09 PM

## 2021-11-03 ENCOUNTER — Encounter: Payer: Self-pay | Admitting: Family Medicine

## 2021-11-03 ENCOUNTER — Other Ambulatory Visit: Payer: Self-pay

## 2021-11-03 ENCOUNTER — Ambulatory Visit (INDEPENDENT_AMBULATORY_CARE_PROVIDER_SITE_OTHER): Payer: BC Managed Care – PPO | Admitting: Family Medicine

## 2021-11-03 VITALS — BP 120/80 | HR 80 | Ht 65.0 in | Wt 141.0 lb

## 2021-11-03 DIAGNOSIS — H6123 Impacted cerumen, bilateral: Secondary | ICD-10-CM | POA: Diagnosis not present

## 2021-11-03 DIAGNOSIS — H6983 Other specified disorders of Eustachian tube, bilateral: Secondary | ICD-10-CM | POA: Diagnosis not present

## 2021-11-03 DIAGNOSIS — H6993 Unspecified Eustachian tube disorder, bilateral: Secondary | ICD-10-CM

## 2021-11-03 MED ORDER — CARBAMIDE PEROXIDE 6.5 % OT SOLN: 5.0000 [drp] | Freq: Two times a day (BID) | OTIC | 2 refills | Status: AC

## 2021-11-03 NOTE — Progress Notes (Signed)
Date:  11/03/2021   Name:  Stephanie Mcguire   DOB:  1971-10-10   MRN:  [id number]   Chief Complaint: Ear Fullness  Ear Fullness  There is pain in both (L>R) ears. The problem occurs constantly. The problem has been waxing and waning. There has been no fever. The pain is moderate. Pertinent negatives include no abdominal pain.   Lab Results  Component Value Date   CREATININE 0.83 12/25/2020   BUN 7 12/25/2020   NA 133 (L) 12/25/2020   K 3.7 12/25/2020   CL 97 (L) 12/25/2020   CO2 27 12/25/2020   No results found for: CHOL, HDL, LDLCALC, LDLDIRECT, TRIG, CHOLHDL No results found for: TSH No results found for: HGBA1C Lab Results  Component Value Date   WBC 6.4 12/25/2020   HGB 14.7 12/25/2020   HCT 43.2 12/25/2020   MCV 93.5 12/25/2020   PLT 268 12/25/2020   No results found for: ALT, AST, GGT, ALKPHOS, BILITOT   Review of Systems  Gastrointestinal:  Negative for abdominal pain.   Patient Active Problem List   Diagnosis Date Noted   Lumbar radiculopathy 10/17/2020   Patellofemoral arthritis of right knee 10/01/2020   Acute medial meniscal tear, right, initial encounter 08/02/2020   Interstitial lung disease (HCC) 10/14/2015   Dyspnea on exertion 10/14/2015   Congenital hypothyroidism without goiter 10/14/2015   Abnormal chest CT 10/14/2015   Essential vulvodynia 12/10/2011    No Known Allergies  Past Surgical History:  Procedure Laterality Date   BUNIONECTOMY     MANDIBLE SURGERY  2009   RECONSTRUCTION OF EYELID  1989 and 1990    Social History   Tobacco Use   Smoking status: Never   Smokeless tobacco: Never  Vaping Use   Vaping Use: Never used  Substance Use Topics   Alcohol use: Yes    Alcohol/week: 2.0 standard drinks    Types: 2 Glasses of wine per week   Drug use: No     Medication list has been reviewed and updated.  Current Meds  Medication Sig   albuterol (VENTOLIN HFA) 108 (90 Base) MCG/ACT inhaler Inhale 2 puffs into the lungs  every 6 (six) hours as needed for wheezing or shortness of breath.   carbamazepine (TEGRETOL) 200 MG tablet 800 mg daily. Dr Evelene Croon   cetirizine-pseudoephedrine (ZYRTEC-D) 5-120 MG tablet Take 1 tablet by mouth 2 (two) times daily as needed for allergies.   cyanocobalamin (,VITAMIN B-12,) 1000 MCG/ML injection Inject into the muscle. neurology   Ergocalciferol 50 MCG (2000 UT) TABS Take 1 capsule by mouth daily. neuro   fluticasone (FLONASE) 50 MCG/ACT nasal spray Place 2 sprays into both nostrils daily.   lamoTRIgine (LAMICTAL) 200 MG tablet Take 200 mg by mouth daily. Evelene Croon   levothyroxine (SYNTHROID) 100 MCG tablet Take 1 tablet (100 mcg total) by mouth daily before breakfast.   LORazepam (ATIVAN) 1 MG tablet Take 1 mg by mouth daily as needed. Dr Evelene Croon   norethindrone-ethinyl estradiol (LOESTRIN) 1-20 MG-MCG tablet Take 1 tablet by mouth daily.    PHQ 2/9 Scores 06/17/2021 02/11/2021  PHQ - 2 Score 0 4  PHQ- 9 Score 0 17    GAD 7 : Generalized Anxiety Score 06/17/2021 02/11/2021  Nervous, Anxious, on Edge 0 3  Control/stop worrying 0 2  Worry too much - different things 0 2  Trouble relaxing 0 3  Restless 0 2  Easily annoyed or irritable 0 3  Afraid - awful might happen 0 1  Total GAD 7 Score 0 16  Anxiety Difficulty - Somewhat difficult    BP Readings from Last 3 Encounters:  11/03/21 120/80  10/30/21 122/70  08/01/21 124/80    Physical Exam Vitals and nursing note reviewed.  Constitutional:      General: She is not in acute distress.    Appearance: She is not diaphoretic.  HENT:     Head: Normocephalic and atraumatic.     Jaw: There is normal jaw occlusion.     Right Ear: Ear canal and external ear normal. There is impacted cerumen. Tympanic membrane is retracted.     Left Ear: Ear canal and external ear normal. There is impacted cerumen. Tympanic membrane is retracted.     Nose: Nose normal.  Eyes:     General:        Right eye: No discharge.        Left eye: No  discharge.     Conjunctiva/sclera: Conjunctivae normal.     Pupils: Pupils are equal, round, and reactive to light.  Neck:     Thyroid: No thyromegaly.     Vascular: No JVD.  Cardiovascular:     Rate and Rhythm: Normal rate and regular rhythm.     Heart sounds: Normal heart sounds. No murmur heard.   No friction rub. No gallop.  Pulmonary:     Effort: Pulmonary effort is normal.     Breath sounds: Normal breath sounds.  Abdominal:     General: Bowel sounds are normal.     Palpations: Abdomen is soft. There is no mass.     Tenderness: There is no abdominal tenderness. There is no guarding.  Musculoskeletal:        General: Normal range of motion.     Cervical back: Normal range of motion and neck supple.  Lymphadenopathy:     Cervical: No cervical adenopathy.  Skin:    General: Skin is warm and dry.  Neurological:     Mental Status: She is alert.     Deep Tendon Reflexes: Reflexes are normal and symmetric.    Wt Readings from Last 3 Encounters:  11/03/21 141 lb (64 kg)  10/30/21 140 lb (63.5 kg)  08/01/21 142 lb (64.4 kg)    BP 120/80   Pulse 80   Ht 5\' 5"  (1.651 m)   Wt 141 lb (64 kg)   BMI 23.46 kg/m   Assessment and Plan:  1. Bilateral impacted cerumen Bilateral cerumen impaction noted.  This area was irrigated gently with warm water and cerumen impaction eased out.  Patient has been given Debrox 6.5% to be used in both ears maybe twice a week to be used with bulb syringe and warm water with gentle pressure. - carbamide peroxide (DEBROX) 6.5 % OTIC solution; Place 5 drops into both ears 2 (two) times daily.  Dispense: 15 mL; Refill: 2  2. Dysfunction of both eustachian tubes Eardrums were noted to be bilateral retracted consistent with eustachian tube dysfunction there is also some erythema directly on the eardrums to suggest cerumen irritation.  This should be cleared with the use of combination of Flonase/Sudafed/and nasal decongestant such as Afrin for just 3  days only

## 2021-11-03 NOTE — Patient Instructions (Addendum)
Eustachian Tube Dysfunction Eustachian tube dysfunction refers to a condition in which a blockage develops in the narrow passage that connects the middle ear to the back of the nose (eustachian tube). The eustachian tube regulates air pressure in the middle ear by letting air move between the ear and nose. It also helps to drain fluid from the middle ear space. Eustachian tube dysfunction can affect one or both ears. When the eustachian tube does not function properly, air pressure, fluid, or both can build up in the middle ear. What are the causes? This condition occurs when the eustachian tube becomes blocked or cannot open normally. Common causes of this condition include: Ear infections. Colds and other infections that affect the nose, mouth, and throat (upper respiratory tract). Allergies. Irritation from cigarette smoke. Irritation from stomach acid coming up into the esophagus (gastroesophageal reflux). The esophagus is the part of the body that moves food from the mouth to the stomach. Sudden changes in air pressure, such as from descending in an airplane or scuba diving. Abnormal growths in the nose or throat, such as: Growths that line the nose (nasal polyps). Abnormal growth of cells (tumors). Enlarged tissue at the back of the throat (adenoids). What increases the risk? You are more likely to develop this condition if: You smoke. You are overweight. You are a child who has: Certain birth defects of the mouth, such as cleft palate. Large tonsils or adenoids. What are the signs or symptoms? Common symptoms of this condition include: A feeling of fullness in the ear. Ear pain. Clicking or popping noises in the ear. Ringing in the ear (tinnitus). Hearing loss. Loss of balance. Dizziness. Symptoms may get worse when the air pressure around you changes, such as when you travel to an area of high elevation, fly on an airplane, or go scuba diving. How is this diagnosed? This  condition may be diagnosed based on: Your symptoms. A physical exam of your ears, nose, and throat. Tests, such as those that measure: The movement of your eardrum. Your hearing (audiometry). How is this treated? Treatment depends on the cause and severity of your condition. In mild cases, you may relieve your symptoms by moving air into your ears. This is called "popping the ears." In more severe cases, or if you have symptoms of fluid in your ears, treatment may include: Medicines to relieve congestion (decongestants). Medicines that treat allergies (antihistamines). Nasal sprays or ear drops that contain medicines that reduce swelling (steroids). A procedure to drain the fluid in your eardrum. In this procedure, a small tube may be placed in the eardrum to: Drain the fluid. Restore the air in the middle ear space. A procedure to insert a balloon device through the nose to inflate the opening of the eustachian tube (balloon dilation). Follow these instructions at home: Lifestyle Do not do any of the following until your health care provider approves: Travel to high altitudes. Fly in airplanes. Work in a pressurized cabin or room. Scuba dive. Do not use any products that contain nicotine or tobacco. These products include cigarettes, chewing tobacco, and vaping devices, such as e-cigarettes. If you need help quitting, ask your health care provider. Keep your ears dry. Wear fitted earplugs during showering and bathing. Dry your ears completely after. General instructions Take over-the-counter and prescription medicines only as told by your health care provider. Use techniques to help pop your ears as recommended by your health care provider. These may include: Chewing gum. Yawning. Frequent, forceful swallowing. Closing   your mouth, holding your nose closed, and gently blowing as if you are trying to blow air out of your nose. Keep all follow-up visits. This is important. Contact a  health care provider if: Your symptoms do not go away after treatment. Your symptoms come back after treatment. You are unable to pop your ears. You have: A fever. Pain in your ear. Pain in your head or neck. Fluid draining from your ear. Your hearing suddenly changes. You become very dizzy. You lose your balance. Get help right away if: You have a sudden, severe increase in any of your symptoms. Summary Eustachian tube dysfunction refers to a condition in which a blockage develops in the eustachian tube. It can be caused by ear infections, allergies, inhaled irritants, or abnormal growths in the nose or throat. Symptoms may include ear pain or fullness, hearing loss, or ringing in the ears. Mild cases are treated with techniques to unblock the ears, such as yawning or chewing gum. More severe cases are treated with medicines or procedures. This information is not intended to replace advice given to you by your health care provider. Make sure you discuss any questions you have with your health care provider. Document Revised: 02/24/2021 Document Reviewed: 02/24/2021 Elsevier Patient Education  2022 [employer] Earwax Buildup, Adult The ears produce a substance called earwax that helps keep bacteria out of the ear and protects the skin in the ear canal. Occasionally, earwax can build up in the ear and cause discomfort or hearing loss. What are the causes? This condition is caused by a buildup of earwax. Ear canals are self-cleaning. Ear wax is made in the outer part of the ear canal and generally falls out in small amounts over time. When the self-cleaning mechanism is not working, earwax builds up and can cause decreased hearing and discomfort. Attempting to clean ears with cotton swabs can push the earwax deep into the ear canal and cause decreased hearing and pain. What increases the risk? This condition is more likely to develop in people who: Clean their ears often with cotton  swabs. Pick at their ears. Use earplugs or in-ear headphones often, or wear hearing aids. The following factors may also make you more likely to develop this condition: Being female. Being of older age. Naturally producing more earwax. Having narrow ear canals. Having earwax that is overly thick or sticky. Having excess hair in the ear canal. Having eczema. Being dehydrated. What are the signs or symptoms? Symptoms of this condition include: Reduced or muffled hearing. A feeling of fullness in the ear or feeling that the ear is plugged. Fluid coming from the ear. Ear pain or an itchy ear. Ringing in the ear. Coughing. Balance problems. An obvious piece of earwax that can be seen inside the ear canal. How is this diagnosed? This condition may be diagnosed based on: Your symptoms. Your medical history. An ear exam. During the exam, your health care provider will look into your ear with an instrument called an otoscope. You may have tests, including a hearing test. How is this treated? This condition may be treated by: Using ear drops to soften the earwax. Having the earwax removed by a health care provider. The health care provider may: Flush the ear with water. Use an instrument that has a loop on the end (curette). Use a suction device. Having surgery to remove the wax buildup. This may be done in severe cases. Follow these instructions at home:  Take over-the-counter and prescription medicines only  as told by your health care provider. Do not put any objects, including cotton swabs, into your ear. You can clean the opening of your ear canal with a washcloth or facial tissue. Follow instructions from your health care provider about cleaning your ears. Do not overclean your ears. Drink enough fluid to keep your urine pale yellow. This will help to thin the earwax. Keep all follow-up visits as told. If earwax builds up in your ears often or if you use hearing aids, consider  seeing your health care provider for routine, preventive ear cleanings. Ask your health care provider how often you should schedule your cleanings. If you have hearing aids, clean them according to instructions from the manufacturer and your health care provider. Contact a health care provider if: You have ear pain. You develop a fever. You have pus or other fluid coming from your ear. You have hearing loss. You have ringing in your ears that does not go away. You feel like the room is spinning (vertigo). Your symptoms do not improve with treatment. Get help right away if: You have bleeding from the affected ear. You have severe ear pain. Summary Earwax can build up in the ear and cause discomfort or hearing loss. The most common symptoms of this condition include reduced or muffled hearing, a feeling of fullness in the ear, or feeling that the ear is plugged. This condition may be diagnosed based on your symptoms, your medical history, and an ear exam. This condition may be treated by using ear drops to soften the earwax or by having the earwax removed by a health care provider. Do not put any objects, including cotton swabs, into your ear. You can clean the opening of your ear canal with a washcloth or facial tissue. This information is not intended to replace advice given to you by your health care provider. Make sure you discuss any questions you have with your health care provider. Document Revised: 04/02/2020 Document Reviewed: 04/02/2020 Elsevier Patient Education  2022 [employer].

## 2021-11-05 ENCOUNTER — Encounter: Payer: Self-pay | Admitting: Family Medicine

## 2021-11-11 NOTE — Progress Notes (Signed)
[name] [address] [place] [address] Phone: [phone] Subjective:   Stephanie Mcguire, am serving as a scribe for Dr. Antoine Primas. This visit occurred during the SARS-CoV-2 public health emergency.  Safety protocols were in place, including screening questions prior to the visit, additional usage of staff PPE, and extensive cleaning of exam room while observing appropriate contact time as indicated for disinfecting solutions.   I'm seeing this patient by the request  of:  Duanne Limerick, MD  CC: right elbow pain   [id number]  Seen Dec 2021 for right knee pain  Updated 11/12/2021 Stephanie Mcguire is a 50 y.o. female coming in with complaint of right elbow pain. 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 Pain on posterior side above and below olecranon. Arnica gel only helps very short period of time. Have not taken any meds.        Past Medical History:  Diagnosis Date   Anxiety    Arthritis    spondylosis   Asthma    Bipolar 2 disorder (HCC)    Bipolar disease, chronic (HCC)    Depression    Thyroid disease    Past Surgical History:  Procedure Laterality Date   BUNIONECTOMY     MANDIBLE SURGERY  2009   RECONSTRUCTION OF EYELID  1989 and 1990   Social History   Socioeconomic History   Marital status: Married    Spouse name: Not on file   Number of children: Not on file   Years of education: Not on file   Highest education level: Not on file  Occupational History   Not on file  Tobacco Use   Smoking status: Never   Smokeless tobacco: Never  Vaping Use   Vaping Use: Never used  Substance and Sexual Activity   Alcohol use: Yes    Alcohol/week: 2.0 standard drinks    Types: 2 Glasses of wine per week   Drug use: No   Sexual activity: Yes    Birth control/protection: Pill  Other Topics Concern   Not on file  Social History Narrative   Not on file    Social Determinants of Health   Financial Resource Strain: Not on file  Food Insecurity: Not on file  Transportation Needs: Not on file  Physical Activity: Not on file  Stress: Not on file  Social Connections: Not on file   No Known Allergies Family History  Problem Relation Age of Onset   Parkinson's disease Mother    Hypertension Father    Stroke Maternal Grandmother     Current Outpatient Medications (Endocrine & Metabolic):    predniSONE (DELTASONE) 20 MG tablet, Take one tablet daily for the next 5 days.   levothyroxine (SYNTHROID) 100 MCG tablet, Take 1 tablet (100 mcg total) by mouth daily before breakfast.   norethindrone-ethinyl estradiol (LOESTRIN) 1-20 MG-MCG tablet, Take 1 tablet by mouth daily.   Current Outpatient Medications (Respiratory):    albuterol (VENTOLIN HFA) 108 (90 Base) MCG/ACT inhaler, Inhale 2 puffs into the lungs every 6 (six) hours as needed for wheezing or shortness of breath.   cetirizine-pseudoephedrine (ZYRTEC-D) 5-120 MG tablet, Take 1 tablet by mouth 2 (two) times daily as needed for allergies.   fluticasone (FLONASE) 50 MCG/ACT nasal spray, Place 2 sprays into both nostrils daily.   Current Outpatient Medications (Hematological):    cyanocobalamin (,VITAMIN B-12,) 1000 MCG/ML injection, Inject into the  muscle. neurology  Current Outpatient Medications (Other):    gabapentin (NEURONTIN) 100 MG capsule, Take 2 capsules (200 mg total) by mouth at bedtime.   carbamazepine (TEGRETOL) 200 MG tablet, 800 mg daily. Dr Evelene Croon   carbamide peroxide [facility]) 6.5 % OTIC solution, Place 5 drops into both ears 2 (two) times daily.   Ergocalciferol 50 MCG (2000 UT) TABS, Take 1 capsule by mouth daily. neuro   lamoTRIgine (LAMICTAL) 200 MG tablet, Take 200 mg by mouth daily. [facility]   LORazepam (ATIVAN) 1 MG tablet, Take 1 mg by mouth daily as needed. Dr Evelene Croon    Objective  Blood pressure 122/74, pulse 88, height 5\' 5"  (1.651 m), weight 140 lb (63.5 kg),  SpO2 96 %.   General: No apparent distress alert and oriented x3 mood and affect normal, dressed appropriately.  HEENT: Pupils equal, extraocular movements intact  Respiratory: Patient's speak in full sentences and does not appear short of breath  Cardiovascular: No lower extremity edema, non tender, no erythema  MSK: Patient's right elbow does have fairly good range of motion noted.  Patient does have pain though noted minorly over the tricep tendon insertion.  Patient though does not have any worsening pain with resisted extension of the elbow.  Does have some pain with supination and pronation but still pain seems to be associated with the posterior olecranon area.  No tenderness over the medial or lateral epicondylar region.  More pain with flexion of the wrist down compared to extension of the wrist.  Negative Tinel's noted.  No swelling of the elbow noted today  Limited muscular skeletal ultrasound was performed and interpreted by , M  Limited ultrasound of patient's elbow is fairly unremarkable.  No significant hypoechoic changes noted of the lateral epicondylar or medial epicondylar region.  Patient's tricep tendon also does not have any significant findings.  Posterior interosseous nerve though does have some mild increase in Doppler flow but otherwise unremarkable.    Impression and Recommendations:    The above documentation has been reviewed and is accurate and complete Antoine Primas, DO

## 2021-11-12 ENCOUNTER — Ambulatory Visit (INDEPENDENT_AMBULATORY_CARE_PROVIDER_SITE_OTHER): Payer: BC Managed Care – PPO | Admitting: Family Medicine

## 2021-11-12 ENCOUNTER — Other Ambulatory Visit: Payer: Self-pay

## 2021-11-12 ENCOUNTER — Ambulatory Visit: Payer: Self-pay

## 2021-11-12 ENCOUNTER — Encounter: Payer: Self-pay | Admitting: Family Medicine

## 2021-11-12 VITALS — BP 122/74 | HR 88 | Ht 65.0 in | Wt 140.0 lb

## 2021-11-12 DIAGNOSIS — M25521 Pain in right elbow: Secondary | ICD-10-CM | POA: Insufficient documentation

## 2021-11-12 MED ORDER — GABAPENTIN 100 MG PO CAPS: 200.0000 mg | ORAL_CAPSULE | Freq: Every day | ORAL | 0 refills | Status: DC

## 2021-11-12 MED ORDER — PREDNISONE 20 MG PO TABS: ORAL_TABLET | ORAL | 0 refills | Status: DC

## 2021-11-12 NOTE — Patient Instructions (Addendum)
Do prescribed exercises at least 3x a week  Xray [facility] See you again in 6 weeks

## 2021-11-12 NOTE — Assessment & Plan Note (Signed)
Nonspecific pain.  Patient states that the pain seems to be more over the tricep tendon near the elbow and the olecranon area but on ultrasound as well as with strength testing no significant discomfort.  Concern though with patient how the injury did occur as a potential compression injury to the posterior interosseous nerve that could have potentially contributed to some of the discomfort.  We discussed with patient about prednisone as well as gabapentin in case this is more nerve related.  Exercises given no further tricep.  Awaiting x-rays but unfortunately were closed today and patient may do it at another facility.  Follow-up with me again 5 to 6 weeks.

## 2021-11-27 DIAGNOSIS — F3342 Major depressive disorder, recurrent, in full remission: Secondary | ICD-10-CM | POA: Diagnosis not present

## 2021-11-27 DIAGNOSIS — F3189 Other bipolar disorder: Secondary | ICD-10-CM | POA: Diagnosis not present

## 2021-12-01 DIAGNOSIS — R29898 Other symptoms and signs involving the musculoskeletal system: Secondary | ICD-10-CM | POA: Diagnosis not present

## 2021-12-01 DIAGNOSIS — G5601 Carpal tunnel syndrome, right upper limb: Secondary | ICD-10-CM | POA: Diagnosis not present

## 2021-12-15 ENCOUNTER — Ambulatory Visit (INDEPENDENT_AMBULATORY_CARE_PROVIDER_SITE_OTHER): Payer: BC Managed Care – PPO | Admitting: Family Medicine

## 2021-12-15 ENCOUNTER — Encounter: Payer: Self-pay | Admitting: Family Medicine

## 2021-12-15 ENCOUNTER — Other Ambulatory Visit: Payer: Self-pay

## 2021-12-15 ENCOUNTER — Ambulatory Visit
Admission: RE | Admit: 2021-12-15 | Discharge: 2021-12-15 | Disposition: A | Discharge status: Home / Self Care | Payer: BC Managed Care – PPO | Source: Ambulatory Visit | Attending: Family Medicine | Admitting: Family Medicine

## 2021-12-15 ENCOUNTER — Ambulatory Visit
Admission: RE | Admit: 2021-12-15 | Discharge: 2021-12-15 | Disposition: A | Discharge status: Home / Self Care | Payer: BC Managed Care – PPO | Attending: Family Medicine | Admitting: Family Medicine

## 2021-12-15 VITALS — BP 134/78 | HR 95 | Ht 65.0 in | Wt 143.0 lb

## 2021-12-15 DIAGNOSIS — R079 Chest pain, unspecified: Secondary | ICD-10-CM

## 2021-12-15 DIAGNOSIS — R0789 Other chest pain: Secondary | ICD-10-CM | POA: Diagnosis not present

## 2021-12-15 DIAGNOSIS — J4521 Mild intermittent asthma with (acute) exacerbation: Secondary | ICD-10-CM | POA: Diagnosis not present

## 2021-12-15 DIAGNOSIS — U099 Post covid-19 condition, unspecified: Secondary | ICD-10-CM | POA: Insufficient documentation

## 2021-12-15 DIAGNOSIS — R059 Cough, unspecified: Secondary | ICD-10-CM | POA: Diagnosis not present

## 2021-12-15 DIAGNOSIS — R053 Chronic cough: Secondary | ICD-10-CM | POA: Insufficient documentation

## 2021-12-15 DIAGNOSIS — R062 Wheezing: Secondary | ICD-10-CM | POA: Diagnosis not present

## 2021-12-15 IMAGING — CR DG CHEST 2V
2 series · 2 of 2 positions shown · non-contrast
Comparison: [DATE]

CLINICAL DATA: Cough, wheezing, chest tightness

EXAM:
CHEST - 2 VIEW

[chest pa]
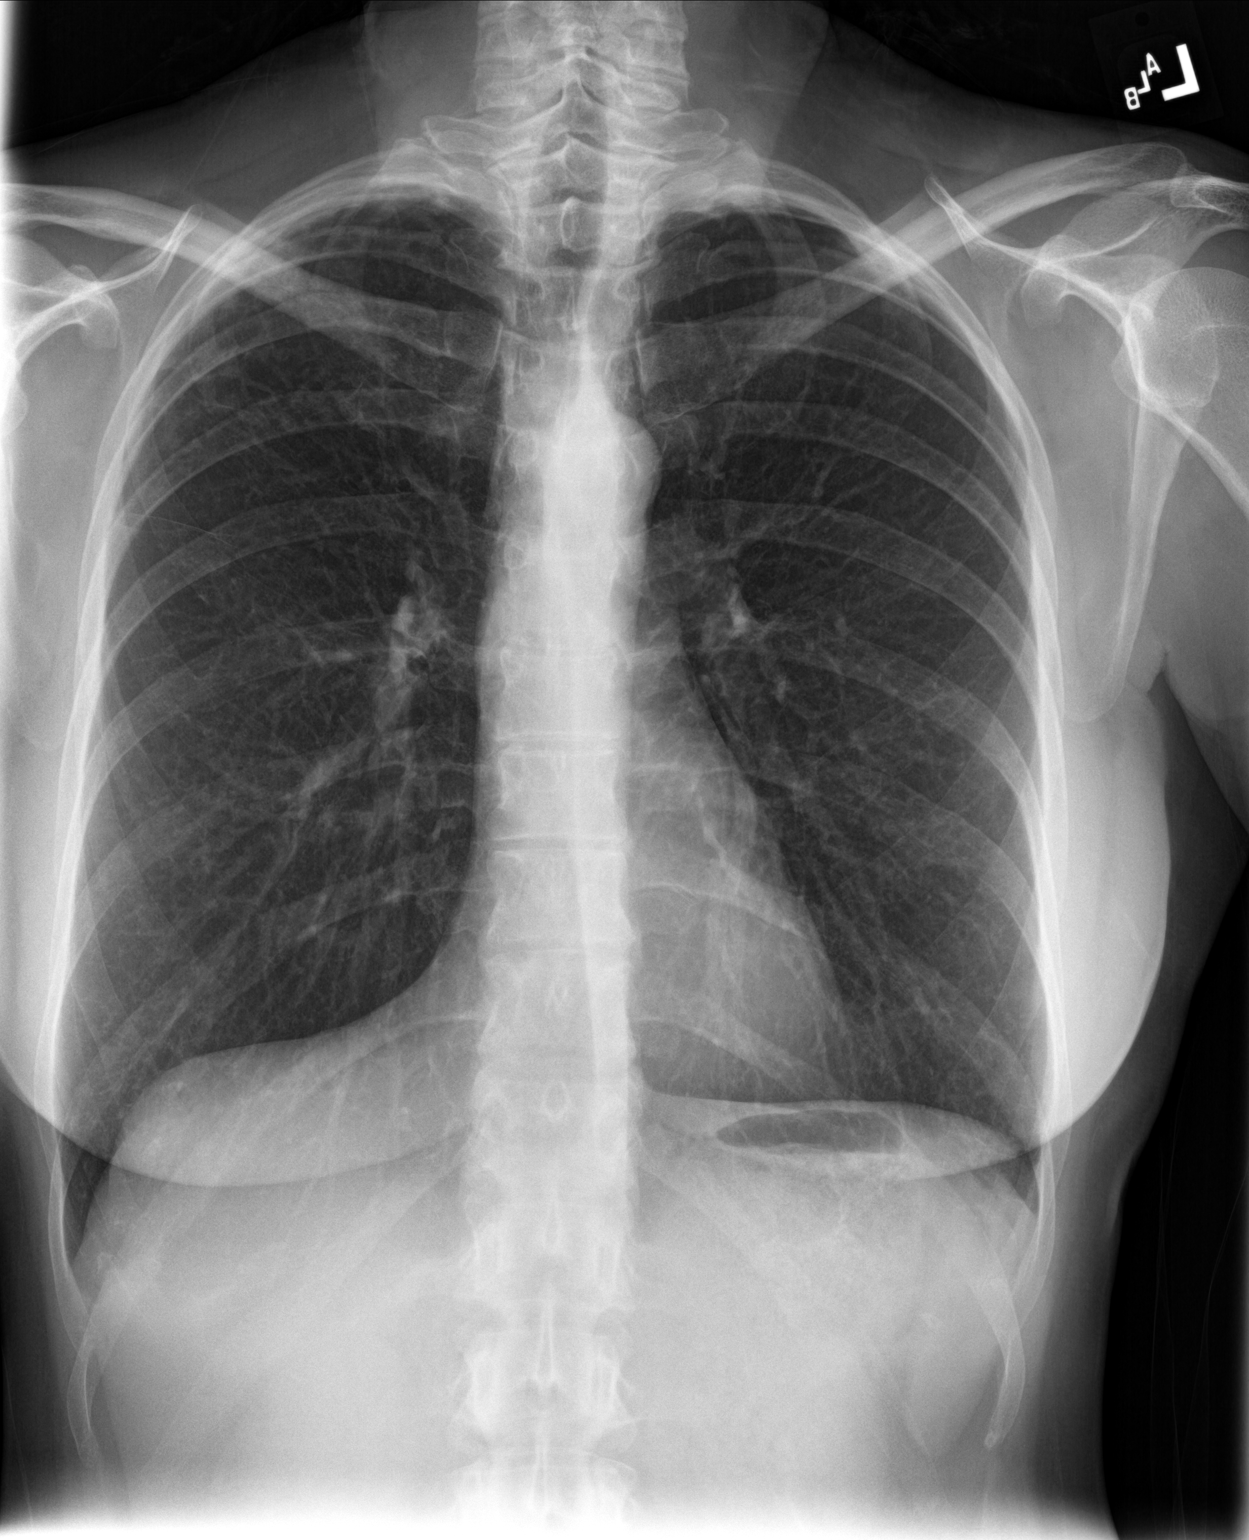

[chest lat]
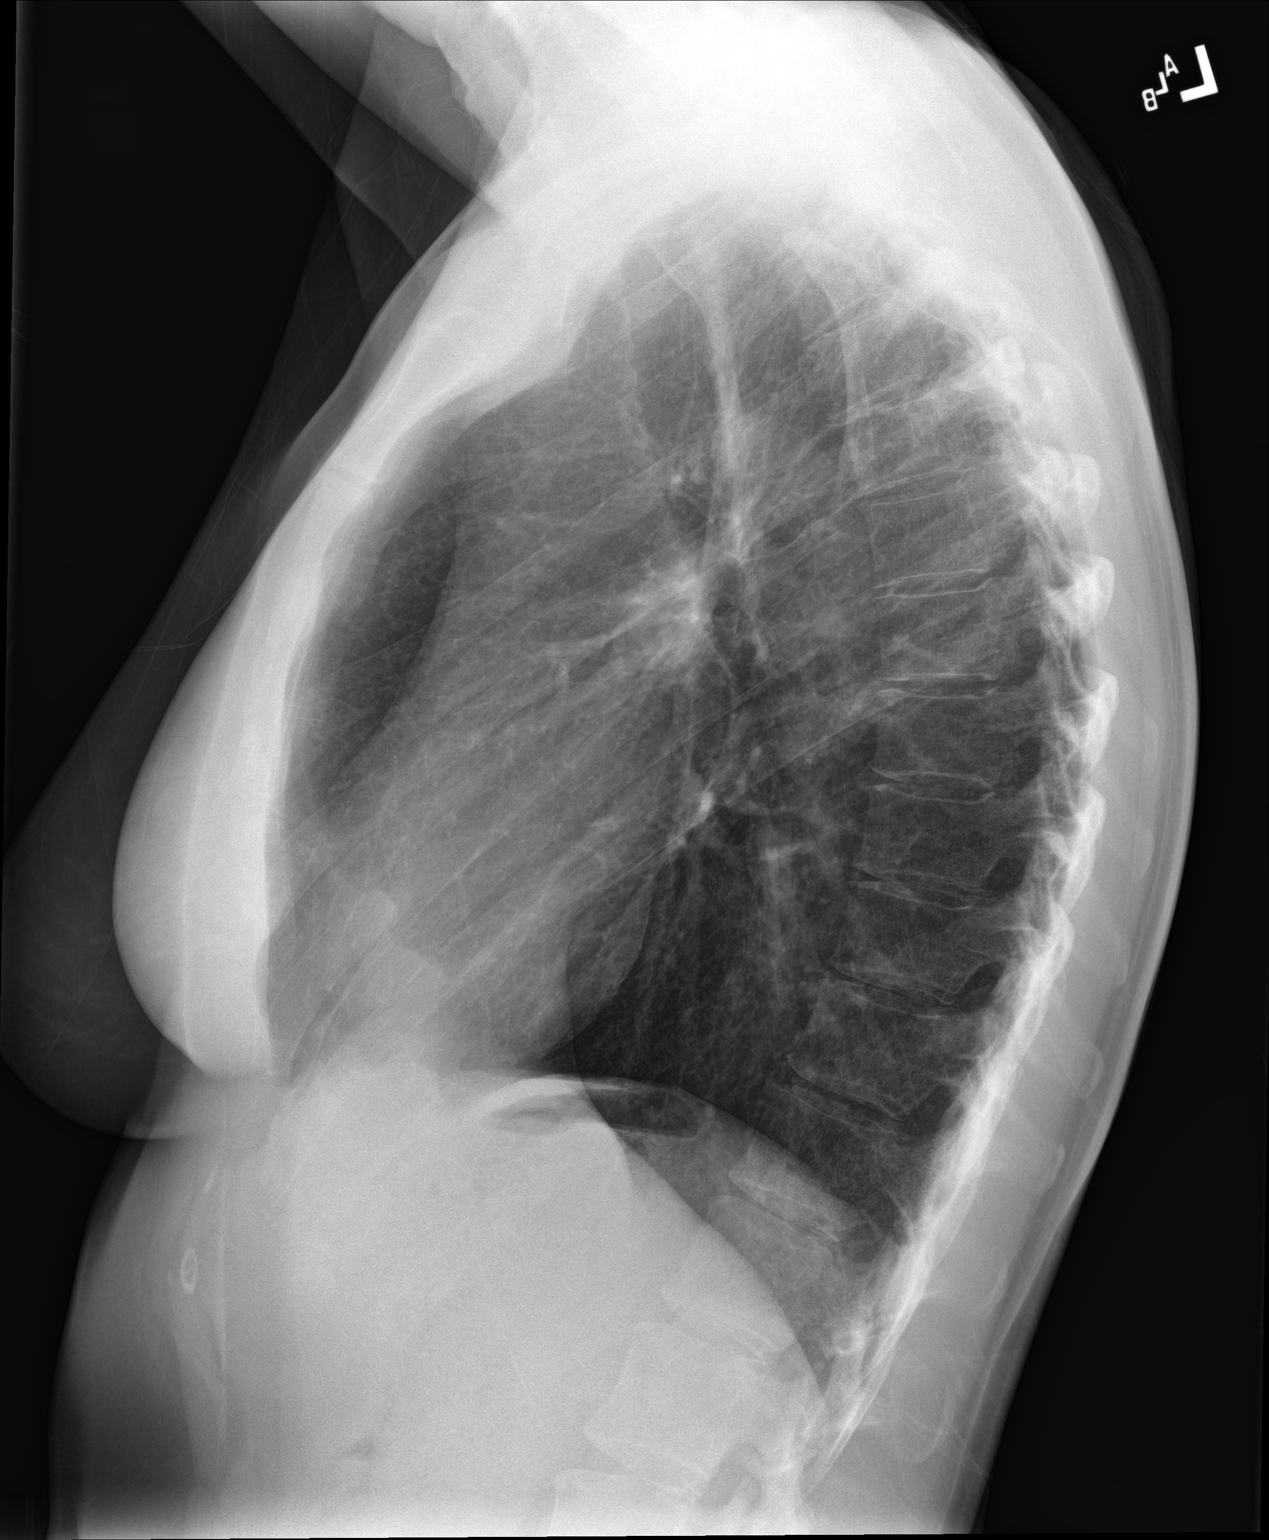

[2 of 2 positions shown; findings below may reference images not displayed]

FINDINGS: The heart size and mediastinal contours are within normal limits.
Both lungs are clear. The visualized skeletal structures are
unremarkable.
IMPRESSION: No active cardiopulmonary disease.

## 2021-12-15 MED ORDER — MONTELUKAST SODIUM 10 MG PO TABS: 10.0000 mg | ORAL_TABLET | Freq: Every day | ORAL | 1 refills | Status: DC

## 2021-12-15 MED ORDER — BREZTRI AEROSPHERE 160-9-4.8 MCG/ACT IN AERO: 2.0000 | INHALATION_SPRAY | Freq: Two times a day (BID) | RESPIRATORY_TRACT | 1 refills | Status: DC

## 2021-12-15 MED ORDER — BENZONATATE 100 MG PO CAPS: 100.0000 mg | ORAL_CAPSULE | Freq: Three times a day (TID) | ORAL | 2 refills | Status: DC | PRN

## 2021-12-15 NOTE — Progress Notes (Signed)
Date:  12/15/2021   Name:  Stephanie Mcguire   DOB:  03-21-1971   MRN:  [id number]   Chief Complaint: Follow-up  Cough This is a new problem. The current episode started 1 to 4 weeks ago (2 weeks). The problem has been waxing and waning. The problem occurs every few hours. The cough is Productive of sputum. Associated symptoms include chest pain, heartburn, nasal congestion, postnasal drip, rhinorrhea, a sore throat, shortness of breath and wheezing. Pertinent negatives include no chills, ear congestion, ear pain, fever, headaches, hemoptysis, myalgias, rash, sweats or weight loss. The symptoms are aggravated by lying down and cold air. She has tried a beta-agonist inhaler, oral steroids and steroid inhaler for the symptoms. The treatment provided moderate relief. There is no history of environmental allergies.  Chest Pain  This is a new problem. The current episode started 1 to 4 weeks ago (weeks). The onset quality is gradual. The problem occurs intermittently. The problem has been waxing and waning. The pain is present in the substernal region. The pain is at a severity of 8/10. The pain is moderate. The quality of the pain is described as tightness ("elephant on chest"). The pain does not radiate. Associated symptoms include a cough, diaphoresis, dizziness, exertional chest pressure, palpitations and shortness of breath. Pertinent negatives include no abdominal pain, back pain, fever, headaches, hemoptysis or nausea. Associated symptoms comments: And at rest. Cough relieved by: inprove with albuterol/inhaled steroids/oral steroids. She has tried antacids (pepcid complete) for the symptoms.   Lab Results  Component Value Date   NA 133 (L) 12/25/2020   K 3.7 12/25/2020   CO2 27 12/25/2020   GLUCOSE 91 12/25/2020   BUN 7 12/25/2020   CREATININE 0.83 12/25/2020   CALCIUM 9.1 12/25/2020   GFRNONAA >60 12/25/2020   No results found for: CHOL, HDL, LDLCALC, LDLDIRECT, TRIG, CHOLHDL No results  found for: TSH No results found for: HGBA1C Lab Results  Component Value Date   WBC 6.4 12/25/2020   HGB 14.7 12/25/2020   HCT 43.2 12/25/2020   MCV 93.5 12/25/2020   PLT 268 12/25/2020   No results found for: ALT, AST, GGT, ALKPHOS, BILITOT No results found for: 25OHVITD2, 25OHVITD3, VD25OH   Review of Systems  Constitutional:  Positive for diaphoresis. Negative for chills, fever and weight loss.  HENT:  Positive for postnasal drip, rhinorrhea and sore throat. Negative for drooling, ear discharge and ear pain.   Respiratory:  Positive for cough, shortness of breath and wheezing. Negative for hemoptysis.   Cardiovascular:  Positive for chest pain and palpitations. Negative for leg swelling.  Gastrointestinal:  Positive for heartburn. Negative for abdominal pain, blood in stool, constipation, diarrhea and nausea.  Endocrine: Negative for polydipsia.  Genitourinary:  Negative for dysuria, frequency, hematuria and urgency.  Musculoskeletal:  Negative for back pain, myalgias and neck pain.  Skin:  Negative for rash.  Allergic/Immunologic: Negative for environmental allergies.  Neurological:  Positive for dizziness. Negative for headaches.  Hematological:  Does not bruise/bleed easily.  Psychiatric/Behavioral:  Negative for suicidal ideas. The patient is not nervous/anxious.    Patient Active Problem List   Diagnosis Date Noted   Right elbow pain 11/12/2021   Lumbar radiculopathy 10/17/2020   Patellofemoral arthritis of right knee 10/01/2020   Acute medial meniscal tear, right, initial encounter 08/02/2020   Interstitial lung disease ([place]) 10/14/2015   Dyspnea on exertion 10/14/2015   Congenital hypothyroidism without goiter 10/14/2015   Abnormal chest CT 10/14/2015   Essential  vulvodynia 12/10/2011    No Known Allergies  Past Surgical History:  Procedure Laterality Date   BUNIONECTOMY     MANDIBLE SURGERY  2009   RECONSTRUCTION OF EYELID  1989 and 1990    Social History    Tobacco Use   Smoking status: Never   Smokeless tobacco: Never  Vaping Use   Vaping Use: Never used  Substance Use Topics   Alcohol use: Yes    Alcohol/week: 2.0 standard drinks    Types: 2 Glasses of wine per week   Drug use: No     Medication list has been reviewed and updated.  No outpatient medications have been marked as taking for the 12/15/21 encounter (Office Visit) with Juline Patch, MD.    [facility] 2/9 Scores 06/17/2021 02/11/2021  PHQ - 2 Score 0 4  PHQ- 9 Score 0 17    GAD 7 : Generalized Anxiety Score 06/17/2021 02/11/2021  Nervous, Anxious, on Edge 0 3  Control/stop worrying 0 2  Worry too much - different things 0 2  Trouble relaxing 0 3  Restless 0 2  Easily annoyed or irritable 0 3  Afraid - awful might happen 0 1  Total GAD 7 Score 0 16  Anxiety Difficulty - Somewhat difficult    BP Readings from Last 3 Encounters:  12/15/21 134/78  11/12/21 122/74  11/03/21 120/80    Physical Exam Vitals and nursing note reviewed.  Constitutional:      General: She is not in acute distress.    Appearance: She is not diaphoretic.  HENT:     Head: Normocephalic and atraumatic.     Right Ear: Tympanic membrane, ear canal and external ear normal.     Left Ear: Tympanic membrane, ear canal and external ear normal.     Nose: Congestion present. No rhinorrhea.     Mouth/Throat:     Pharynx: Oropharynx is clear. No posterior oropharyngeal erythema.     Tonsils: No tonsillar exudate.  Eyes:     General:        Right eye: No discharge.        Left eye: No discharge.     Conjunctiva/sclera: Conjunctivae normal.     Pupils: Pupils are equal, round, and reactive to light.  Neck:     Thyroid: No thyroid mass, thyromegaly or thyroid tenderness.     Vascular: No JVD.  Cardiovascular:     Rate and Rhythm: Normal rate and regular rhythm.     Heart sounds: Normal heart sounds, S1 normal and S2 normal. No murmur heard. No systolic murmur is present.  No diastolic murmur  is present.    No friction rub. No gallop. No S3 or S4 sounds.  Pulmonary:     Effort: Pulmonary effort is normal.     Breath sounds: No decreased air movement. Wheezing present. No decreased breath sounds, rhonchi or rales.  Chest:     Chest wall: Tenderness present.    Abdominal:     General: Bowel sounds are normal.     Palpations: Abdomen is soft. There is no hepatomegaly, splenomegaly or mass.     Tenderness: There is no abdominal tenderness. There is no guarding.  Genitourinary:    Rectum: Guaiac stool: ekg.  Musculoskeletal:        General: Normal range of motion.     Cervical back: Full passive range of motion without pain, normal range of motion and neck supple.     Right lower leg: No edema.  Left lower leg: No edema.  Lymphadenopathy:     Cervical: No cervical adenopathy.     Right cervical: No superficial, deep or posterior cervical adenopathy.    Left cervical: No superficial, deep or posterior cervical adenopathy.  Skin:    General: Skin is warm and dry.  Neurological:     Mental Status: She is alert.     Deep Tendon Reflexes: Reflexes are normal and symmetric.    Wt Readings from Last 3 Encounters:  12/15/21 143 lb (64.9 kg)  11/12/21 140 lb (63.5 kg)  11/03/21 141 lb (64 kg)    BP 134/78    Pulse 95    Ht $R'5\' 5"'aN$  (1.651 m)    Wt 143 lb (64.9 kg)    SpO2 98%    BMI 23.80 kg/m   Assessment and Plan:  1. Chest pain, unspecified type Patient walking today.  New onset.  Episodic.  Over the past 2 weeks.  Patient presents with chest pain described as "tightness "and "elephant sitting on chest.  "EKG was done with the following results normal sinus rhythm rate 82.  Normal intervals no LVH criteria met by voltage.  No ischemic changes such as Q waves, R wave progression delay, nor ST-T wave changes.  Patient does have tenderness over her costochondral margins which is consistent with costochondritis and given the above rule out patient has been suggested a trial of  NSAIDs first but if pain should continue she is to go to ER. - DG Chest 2 View; Future - EKG 12-Lead  2. Mild intermittent reactive airway disease with acute exacerbation Relatively new onset persistent over 2 weeks.  Seen in ER and was treated for intermittent reactive airway disease.  There has been some improvement on beta agonist and Flovent we will obtain chest x-ray seen that patient is post-COVID from about a year ago and has had an exacerbation since her last chest x-ray.  We will treat with respiratory as well as Tessalon Perles and Singulair. - benzonatate (TESSALON PERLES) 100 MG capsule; Take 1 capsule (100 mg total) by mouth 3 (three) times daily as needed for cough.  Dispense: 30 capsule; Refill: 2 - Ambulatory referral to Pulmonology - DG Chest 2 View; Future - EKG 12-Lead - Budeson-Glycopyrrol-Formoterol (BREZTRI AEROSPHERE) 160-9-4.8 MCG/ACT AERO; Inhale 2 puffs into the lungs 2 (two) times daily.  Dispense: 10.7 g; Refill: 1 - montelukast (SINGULAIR) 10 MG tablet; Take 1 tablet (10 mg total) by mouth at bedtime.  Dispense: 30 tablet; Refill: 1  3. Post-COVID chronic cough As noted patient has post-COVID cough.  Patient has seen Dr. Raul Del in the past and pulmonary consult has been placed.  Patient is to discussed with husband as well to return to reevaluation with possible pulmonary function testing. - benzonatate (TESSALON PERLES) 100 MG capsule; Take 1 capsule (100 mg total) by mouth 3 (three) times daily as needed for cough.  Dispense: 30 capsule; Refill: 2 - Ambulatory referral to Pulmonology - DG Chest 2 View; Future

## 2021-12-16 NOTE — Progress Notes (Signed)
[facility] [address] [place] [address] Phone: [phone] Subjective:   Stephanie Mcguire, am serving as a scribe for Dr. Antoine Mcguire. This visit occurred during the SARS-CoV-2 public health emergency.  Safety protocols were in place, including screening questions prior to the visit, additional usage of staff PPE, and extensive cleaning of exam room while observing appropriate contact time as indicated for disinfecting solutions.   I'm seeing this patient by the request  of:  Duanne Limerick, MD  CC: Right elbow pain follow-up and right wrist pain  CZY:[id number]  11/12/2021 Nonspecific pain.  Patient states that the pain seems to be more over the tricep tendon near the elbow and the olecranon area but on ultrasound as well as with strength testing no significant discomfort.  Concern though with patient how the injury did occur as a potential compression injury to the posterior interosseous nerve that could have potentially contributed to some of the discomfort.  We discussed with patient about prednisone as well as gabapentin in case this is more nerve related.  Exercises given no further tricep.  Awaiting x-rays but unfortunately were closed today and patient may do it at another facility.  Follow-up with me again 5 to 6 weeks.  Update 12/17/2021 Stephanie Mcguire is a 50 y.o. female coming in with complaint of R elbow pain. Patient states was progressively improving and after EMG on right arm pain came back. Steadily improving. No new complaints.  Right hand possible carpal tunnel.  Was able to find the patient did have a nerve conduction study.  This was read by me showing that it was potentially inconclusive with mild carpal tunnel.     Past Medical History:  Diagnosis Date   Anxiety    Arthritis    spondylosis   Asthma    Bipolar 2 disorder (HCC)    Bipolar disease, chronic (HCC)    Depression    Thyroid disease    Past Surgical History:   Procedure Laterality Date   BUNIONECTOMY     MANDIBLE SURGERY  2009   RECONSTRUCTION OF EYELID  1989 and 1990   Social History   Socioeconomic History   Marital status: Married    Spouse name: Not on file   Number of children: Not on file   Years of education: Not on file   Highest education level: Not on file  Occupational History   Not on file  Tobacco Use   Smoking status: Never   Smokeless tobacco: Never  Vaping Use   Vaping Use: Never used  Substance and Sexual Activity   Alcohol use: Yes    Alcohol/week: 2.0 standard drinks    Types: 2 Glasses of wine per week   Drug use: No   Sexual activity: Yes    Birth control/protection: Pill  Other Topics Concern   Not on file  Social History Narrative   Not on file   Social Determinants of Health   Financial Resource Strain: Not on file  Food Insecurity: Not on file  Transportation Needs: Not on file  Physical Activity: Not on file  Stress: Not on file  Social Connections: Not on file   No Known Allergies Family History  Problem Relation Age of Onset   Parkinson's disease Mother    Hypertension Father    Stroke Maternal Grandmother     Current Outpatient Medications (Endocrine & Metabolic):    levothyroxine (SYNTHROID) 100 MCG tablet, Take 1 tablet (100 mcg total)  by mouth daily before breakfast.   norethindrone-ethinyl estradiol (LOESTRIN) 1-20 MG-MCG tablet, Take 1 tablet by mouth daily.   predniSONE (DELTASONE) 20 MG tablet, Take one tablet daily for the next 5 days.   Current Outpatient Medications (Respiratory):    albuterol (VENTOLIN HFA) 108 (90 Base) MCG/ACT inhaler, Inhale 2 puffs into the lungs every 6 (six) hours as needed for wheezing or shortness of breath.   benzonatate (TESSALON PERLES) 100 MG capsule, Take 1 capsule (100 mg total) by mouth 3 (three) times daily as needed for cough.   Budeson-Glycopyrrol-Formoterol (BREZTRI AEROSPHERE) 160-9-4.8 MCG/ACT AERO, Inhale 2 puffs into the lungs 2 (two)  times daily.   cetirizine-pseudoephedrine (ZYRTEC-D) 5-120 MG tablet, Take 1 tablet by mouth 2 (two) times daily as needed for allergies.   fluticasone (FLONASE) 50 MCG/ACT nasal spray, Place 2 sprays into both nostrils daily.   montelukast (SINGULAIR) 10 MG tablet, Take 1 tablet (10 mg total) by mouth at bedtime.   Current Outpatient Medications (Hematological):    cyanocobalamin (,VITAMIN B-12,) 1000 MCG/ML injection, Inject into the muscle. neurology  Current Outpatient Medications (Other):    carbamazepine (TEGRETOL) 200 MG tablet, 800 mg daily. Dr Evelene Croon   carbamide peroxide [facility]) 6.5 % OTIC solution, Place 5 drops into both ears 2 (two) times daily.   Ergocalciferol 50 MCG (2000 UT) TABS, Take 1 capsule by mouth daily. neuro   gabapentin (NEURONTIN) 100 MG capsule, Take 2 capsules (200 mg total) by mouth at bedtime.   lamoTRIgine (LAMICTAL) 200 MG tablet, Take 200 mg by mouth daily. [place]   LORazepam (ATIVAN) 1 MG tablet, Take 1 mg by mouth daily as needed. Dr Evelene Croon   Reviewed prior external information including notes and imaging from  primary care provider As well as notes that were available from care everywhere and other healthcare systems.  Past medical history, social, surgical and family history all reviewed in electronic medical record.  No pertanent information unless stated regarding to the chief complaint.   Review of Systems:  No headache, visual changes, nausea, vomiting, diarrhea, constipation, dizziness, abdominal pain, skin rash, fevers, chills, night sweats, weight loss, swollen lymph nodes, body aches, joint swelling, chest pain, shortness of breath, mood changes. POSITIVE muscle aches  Objective  Blood pressure 132/84, pulse 97, height 5\' 5"  (1.651 m), weight 141 lb (64 kg), last menstrual period 11/26/2021, SpO2 98 %.   General: No apparent distress alert and oriented x3 mood and affect normal, dressed appropriately.  HEENT: Pupils equal, extraocular movements  intact  Respiratory: Patient's speak in full sentences and does not appear short of breath  Cardiovascular: No lower extremity edema, non tender, no erythema  Gait normal with good balance and coordination.  MSK: Right elbow exam has full range of motion noted at this time.  Patient has very minimal tenderness over the radial head.  Negative Tinel's sign at this level.  Patient does have positive Tinel sign noted of the right wrist.  Patient does have mild positive Phalen's as well.  Good grip strength noted.  Limited muscular skeletal ultrasound was performed and interpreted by 11/28/2021, M  Limited musculoskeletal ultrasound shows patient does have some enlargement of the median nerve noted.  Also does have some thickening of the retinaculum. Impression: Mild carpal tunnel    Impression and Recommendations:    The above documentation has been reviewed and is accurate and complete Stephanie Primas, DO

## 2021-12-17 ENCOUNTER — Ambulatory Visit (INDEPENDENT_AMBULATORY_CARE_PROVIDER_SITE_OTHER): Payer: BC Managed Care – PPO | Admitting: Family Medicine

## 2021-12-17 ENCOUNTER — Other Ambulatory Visit: Payer: Self-pay

## 2021-12-17 ENCOUNTER — Encounter: Payer: Self-pay | Admitting: Family Medicine

## 2021-12-17 ENCOUNTER — Ambulatory Visit: Payer: Self-pay

## 2021-12-17 ENCOUNTER — Ambulatory Visit (INDEPENDENT_AMBULATORY_CARE_PROVIDER_SITE_OTHER): Payer: BC Managed Care – PPO

## 2021-12-17 VITALS — BP 132/84 | HR 97 | Ht 65.0 in | Wt 141.0 lb

## 2021-12-17 DIAGNOSIS — M25521 Pain in right elbow: Secondary | ICD-10-CM | POA: Diagnosis not present

## 2021-12-17 DIAGNOSIS — G5601 Carpal tunnel syndrome, right upper limb: Secondary | ICD-10-CM | POA: Diagnosis not present

## 2021-12-17 MED ORDER — GABAPENTIN 100 MG PO CAPS: 200.0000 mg | ORAL_CAPSULE | Freq: Every day | ORAL | 0 refills | Status: DC

## 2021-12-17 NOTE — Assessment & Plan Note (Signed)
Right elbow exam site seems to be better at this point.  X-rays do show that there may have been a remote injury to the radial head.  With findings of the right wrist and more concerning for the carpal tunnel I think this is contributing to more of the elbow pain as well.  We will treat more for the carpal tunnel and see how patient responds.

## 2021-12-17 NOTE — Patient Instructions (Signed)
Do prescribed exercises at least 3x a week Gabapentin at night Day and Night for 1 week, Nightly 2 weeks after [place] See you again in 6 weeks

## 2021-12-17 NOTE — Assessment & Plan Note (Signed)
Patient does have a nerve conduction study that was fairly inconclusive but potentially some mild carpal tunnel.  Patient's ultrasound today does show likely this is contributing to more the discomfort as well.  At this moment patient will try bracing, home exercises, icing regimen.  Worsening pain will consider the possibility of injections.  Patient is also given gabapentin that I think could be beneficial.

## 2021-12-19 ENCOUNTER — Ambulatory Visit: Payer: BC Managed Care – PPO | Admitting: Family Medicine

## 2021-12-23 ENCOUNTER — Ambulatory Visit: Payer: Self-pay

## 2021-12-23 ENCOUNTER — Encounter: Payer: Self-pay | Admitting: Family Medicine

## 2021-12-23 NOTE — Telephone Encounter (Signed)
°  Chief Complaint: localized rash Symptoms: none Frequency:  Pertinent Negatives: Patient denies itching/pain Disposition: [] ED /[] Urgent Care (no appt availability in office) / [] Appointment(In office/virtual)/ [x]  [place] [facility]/ [] Home Care/ [] Refused Recommended Disposition  Additional Notes: leaving from flight to in 1 hours advised to go to UC.        Answer Assessment - Initial Assessment Questions 1. APPEARANCE of RASH: "Describe the rash."      circle 2. LOCATION: "Where is the rash located?"      Left hip 3. NUMBER: "How many spots are there?"      1 4. SIZE: "How big are the spots?" (Inches, centimeters or compare to size of a coin)      Nickle size 5. ONSET: "When did the rash start?"      yesterday 6. ITCHING: "Does the rash itch?" If Yes, ask: "How bad is the itch?"  (Scale 0-10; or none, mild, moderate, severe)     no 7. PAIN: "Does the rash hurt?" If Yes, ask: "How bad is the pain?"  (Scale 0-10; or none, mild, moderate, severe)    - NONE (0): no pain    - MILD (1-3): doesn't interfere with normal activities     - MODERATE (4-7): interferes with normal activities or awakens from sleep     - SEVERE (8-10): excruciating pain, unable to do any normal activities     no 8. OTHER SYMPTOMS: "Do you have any other symptoms?" (e.g., fever)     no 9. PREGNANCY: "Is there any chance you are pregnant?" "When was your last menstrual period?"     *No Answer*  Protocols used: Rash or Redness - Localized-A-AH

## 2021-12-30 ENCOUNTER — Encounter: Payer: Self-pay | Admitting: Family Medicine

## 2021-12-31 ENCOUNTER — Other Ambulatory Visit: Payer: Self-pay | Admitting: Family Medicine

## 2021-12-31 ENCOUNTER — Other Ambulatory Visit: Payer: Self-pay

## 2021-12-31 DIAGNOSIS — E031 Congenital hypothyroidism without goiter: Secondary | ICD-10-CM

## 2021-12-31 DIAGNOSIS — H6983 Other specified disorders of Eustachian tube, bilateral: Secondary | ICD-10-CM

## 2021-12-31 NOTE — Progress Notes (Signed)
Put in referral to ENT 

## 2021-12-31 NOTE — Telephone Encounter (Signed)
Requested medications are due for refill today.  yes  Requested medications are on the active medications list.  yes  Last refill. 06/17/2021  Future visit scheduled.   yes  Notes to clinic.  Missing labs.    Requested Prescriptions  Pending Prescriptions Disp Refills   levothyroxine (SYNTHROID) 100 MCG tablet [Pharmacy Med Name: L-THYROXINE (SYNTHROID) TABS 100MCG] 90 tablet 3    Sig: TAKE 1 TABLET DAILY BEFORE BREAKFAST     Endocrinology:  Hypothyroid Agents Failed - 12/31/2021 12:25 AM      Failed - TSH needs to be rechecked within 3 months after an abnormal result. Refill until TSH is due.      Failed - TSH in normal range and within 360 days    No results found for: TSH, POCTSH, TSHREFLEX        Passed - Valid encounter within last 12 months    Recent Outpatient Visits           2 weeks ago Chest pain, unspecified type   [facility] Duanne Limerick, MD   1 month ago Dysfunction of both eustachian tubes   [facility] Duanne Limerick, MD   5 months ago Parkinson's disease [facility])   [facility] Duanne Limerick, MD   6 months ago Congenital hypothyroidism without goiter   [facility] Duanne Limerick, MD   10 months ago Pneumonia due to COVID-19 virus   [facility] Duanne Limerick, MD       Future Appointments             In 2 weeks Duanne Limerick, MD [facility]   In 4 weeks Judi Saa, DO [facility]

## 2022-01-06 ENCOUNTER — Ambulatory Visit: Payer: BC Managed Care – PPO | Admitting: Family Medicine

## 2022-01-09 DIAGNOSIS — Z130 Encounter for screening for diseases of the blood and blood-forming organs and certain disorders involving the immune mechanism: Secondary | ICD-10-CM | POA: Diagnosis not present

## 2022-01-09 DIAGNOSIS — Z79899 Other long term (current) drug therapy: Secondary | ICD-10-CM | POA: Diagnosis not present

## 2022-01-15 ENCOUNTER — Encounter: Payer: Self-pay | Admitting: Family Medicine

## 2022-01-15 ENCOUNTER — Other Ambulatory Visit: Payer: Self-pay

## 2022-01-15 ENCOUNTER — Other Ambulatory Visit: Payer: Self-pay | Admitting: Family Medicine

## 2022-01-15 ENCOUNTER — Ambulatory Visit (INDEPENDENT_AMBULATORY_CARE_PROVIDER_SITE_OTHER): Payer: BC Managed Care – PPO | Admitting: Family Medicine

## 2022-01-15 VITALS — BP 128/70 | HR 80 | Ht 65.0 in | Wt 144.0 lb

## 2022-01-15 DIAGNOSIS — E031 Congenital hypothyroidism without goiter: Secondary | ICD-10-CM

## 2022-01-15 DIAGNOSIS — K120 Recurrent oral aphthae: Secondary | ICD-10-CM

## 2022-01-15 DIAGNOSIS — J4521 Mild intermittent asthma with (acute) exacerbation: Secondary | ICD-10-CM | POA: Diagnosis not present

## 2022-01-15 MED ORDER — MONTELUKAST SODIUM 10 MG PO TABS: 10.0000 mg | ORAL_TABLET | Freq: Every day | ORAL | 1 refills | Status: DC

## 2022-01-15 MED ORDER — VALACYCLOVIR HCL 1 G PO TABS: 1000.0000 mg | ORAL_TABLET | Freq: Two times a day (BID) | ORAL | 0 refills | Status: DC

## 2022-01-15 MED ORDER — LEVOTHYROXINE SODIUM 100 MCG PO TABS: 100.0000 ug | ORAL_TABLET | Freq: Every day | ORAL | 1 refills | Status: DC

## 2022-01-15 MED ORDER — PENCICLOVIR 1 % EX CREA: 1.0000 "application " | TOPICAL_CREAM | CUTANEOUS | 0 refills | Status: DC

## 2022-01-15 NOTE — Telephone Encounter (Deleted)
[facility] [id number] [place], [place] - [address]  [place] [address]  Phone: [phone] Fax: [phone]  Hours: Not open 24 hours

## 2022-01-15 NOTE — Telephone Encounter (Signed)
Requested medication (s) are on the active medication list: yes  Future visit scheduled: 07/15/22  Notes to Pt states this med is $600, asking for alternative.  Requested Prescriptions  Pending Prescriptions Disp Refills   penciclovir (DENAVIR) 1 % cream 1.5 g 0    Sig: Apply 1 application topically every 2 (two) hours.     Antimicrobials:  Antiviral Agents - Anti-Herpetic Passed - 01/15/2022  4:28 PM      Passed - Valid encounter within last 12 months    Recent Outpatient Visits           Today Aphthous ulcer   [facility] Juline Patch, MD   1 month ago Chest pain, unspecified type   [facility] Juline Patch, MD   2 months ago Dysfunction of both eustachian tubes   [facility] Juline Patch, MD   5 months ago Parkinson's disease [facility])   [facility] Juline Patch, MD   7 months ago Congenital hypothyroidism without goiter   [facility] Juline Patch, MD       Future Appointments             In 1 week Lyndal Pulley, [place]   In 6 months Juline Patch, MD [facility]

## 2022-01-15 NOTE — Telephone Encounter (Signed)
Pt called to report that this medication is currently 600 dollars. Pt would like to consider a more affordable alternative option, please advise.

## 2022-01-15 NOTE — Addendum Note (Signed)
Addended by: Elby Beck F on: 01/15/2022 04:19 PM   Modules accepted: Orders

## 2022-01-15 NOTE — Telephone Encounter (Signed)
Pt is calling because the penciclovir (DENAVIR) 1 % cream [id number]  was not received by the pharmacy today. Can this pleas be resent to [place], [place].

## 2022-01-15 NOTE — Telephone Encounter (Signed)
Did not want to sign, Epic would not let me send note without signing addendum

## 2022-01-15 NOTE — Progress Notes (Signed)
Date:  01/15/2022   Name:  Stephanie Mcguire   DOB:  [birth year]   MRN:  [id number]   Chief Complaint: Allergic Rhinitis , Hypothyroidism, and Mouth Lesions  Mouth Lesions  The current episode started 5 to 7 days ago. The onset was sudden. The problem is moderate. The symptoms are relieved by one or more prescription drugs. Nothing aggravates the symptoms. Associated symptoms include mouth sores. Pertinent negatives include no fever, no abdominal pain, no constipation, no diarrhea, no nausea, no ear discharge, no ear pain, no headaches, no sore throat, no neck pain, no cough, no wheezing and no rash.  Thyroid Problem Presents for follow-up visit. Symptoms include fatigue and weight loss. Patient reports no anxiety, cold intolerance, constipation, depressed mood, diaphoresis, diarrhea, dry skin, hair loss, heat intolerance, hoarse voice, leg swelling, nail problem, palpitations, visual change or weight gain. The symptoms have been improving.   Lab Results  Component Value Date   NA 133 (L) 12/25/2020   K 3.7 12/25/2020   CO2 27 12/25/2020   GLUCOSE 91 12/25/2020   BUN 7 12/25/2020   CREATININE 0.83 12/25/2020   CALCIUM 9.1 12/25/2020   GFRNONAA >60 12/25/2020   No results found for: CHOL, HDL, LDLCALC, LDLDIRECT, TRIG, CHOLHDL No results found for: TSH No results found for: HGBA1C Lab Results  Component Value Date   WBC 6.4 12/25/2020   HGB 14.7 12/25/2020   HCT 43.2 12/25/2020   MCV 93.5 12/25/2020   PLT 268 12/25/2020   No results found for: ALT, AST, GGT, ALKPHOS, BILITOT No results found for: 25OHVITD2, 25OHVITD3, VD25OH   Review of Systems  Constitutional:  Positive for fatigue and weight loss. Negative for chills, diaphoresis, fever and weight gain.  HENT:  Positive for mouth sores. Negative for drooling, ear discharge, ear pain, hoarse voice and sore throat.   Respiratory:  Negative for cough, shortness of breath and wheezing.   Cardiovascular:  Negative for chest  pain, palpitations and leg swelling.  Gastrointestinal:  Negative for abdominal pain, blood in stool, constipation, diarrhea and nausea.  Endocrine: Negative for cold intolerance, heat intolerance and polydipsia.  Genitourinary:  Negative for dysuria, frequency, hematuria and urgency.  Musculoskeletal:  Negative for back pain, myalgias and neck pain.  Skin:  Negative for rash.  Allergic/Immunologic: Negative for environmental allergies.  Neurological:  Negative for dizziness and headaches.  Hematological:  Does not bruise/bleed easily.  Psychiatric/Behavioral:  Negative for suicidal ideas. The patient is not nervous/anxious.    Patient Active Problem List   Diagnosis Date Noted   Right carpal tunnel syndrome 12/17/2021   Right elbow pain 11/12/2021   Lumbar radiculopathy 10/17/2020   Patellofemoral arthritis of right knee 10/01/2020   Acute medial meniscal tear, right, initial encounter 08/02/2020   Interstitial lung disease ([place]) 10/14/2015   Dyspnea on exertion 10/14/2015   Congenital hypothyroidism without goiter 10/14/2015   Abnormal chest CT 10/14/2015   Essential vulvodynia 12/10/2011    No Known Allergies  Past Surgical History:  Procedure Laterality Date   BUNIONECTOMY     MANDIBLE SURGERY  2009   RECONSTRUCTION OF EYELID  1989 and 1990    Social History   Tobacco Use   Smoking status: Never   Smokeless tobacco: Never  Vaping Use   Vaping Use: Never used  Substance Use Topics   Alcohol use: Yes    Alcohol/week: 2.0 standard drinks    Types: 2 Glasses of wine per week   Drug use: No  Medication list has been reviewed and updated.  Current Meds  Medication Sig   albuterol (VENTOLIN HFA) 108 (90 Base) MCG/ACT inhaler Inhale 2 puffs into the lungs every 6 (six) hours as needed for wheezing or shortness of breath.   Budeson-Glycopyrrol-Formoterol (BREZTRI AEROSPHERE) 160-9-4.8 MCG/ACT AERO Inhale 2 puffs into the lungs 2 (two) times daily.   carbamazepine  (TEGRETOL) 200 MG tablet 600 mg daily. Dr Toy Care   carbamide peroxide (DEBROX) 6.5 % OTIC solution Place 5 drops into both ears 2 (two) times daily.   Ergocalciferol 50 MCG (2000 UT) TABS Take 1 capsule by mouth daily. neuro   gabapentin (NEURONTIN) 100 MG capsule Take 2 capsules (200 mg total) by mouth at bedtime.   lamoTRIgine (LAMICTAL) 200 MG tablet Take 200 mg by mouth daily. Toy Care   levothyroxine (SYNTHROID) 100 MCG tablet TAKE 1 TABLET DAILY BEFORE BREAKFAST   LORazepam (ATIVAN) 1 MG tablet Take 1 mg by mouth daily as needed. Dr Toy Care   montelukast (SINGULAIR) 10 MG tablet Take 1 tablet (10 mg total) by mouth at bedtime.   norethindrone-ethinyl estradiol (LOESTRIN) 1-20 MG-MCG tablet Take 1 tablet by mouth daily.   [DISCONTINUED] cyanocobalamin (,VITAMIN B-12,) 1000 MCG/ML injection Inject into the muscle. neurology    PHQ 2/9 Scores 01/15/2022 06/17/2021 02/11/2021  PHQ - 2 Score 0 0 4  PHQ- 9 Score 0 0 17    GAD 7 : Generalized Anxiety Score 01/15/2022 06/17/2021 02/11/2021  Nervous, Anxious, on Edge 0 0 3  Control/stop worrying 0 0 2  Worry too much - different things 0 0 2  Trouble relaxing 0 0 3  Restless 0 0 2  Easily annoyed or irritable 0 0 3  Afraid - awful might happen 0 0 1  Total GAD 7 Score 0 0 16  Anxiety Difficulty Not difficult at all - Somewhat difficult    BP Readings from Last 3 Encounters:  01/15/22 128/70  12/17/21 132/84  12/15/21 134/78    Physical Exam Vitals and nursing note reviewed.  Constitutional:      Appearance: She is well-developed.  HENT:     Head: Normocephalic.     Right Ear: Tympanic membrane, ear canal and external ear normal.     Left Ear: Tympanic membrane, ear canal and external ear normal.  Eyes:     General: Lids are everted, no foreign bodies appreciated. No scleral icterus.       Left eye: No foreign body or hordeolum.     Conjunctiva/sclera: Conjunctivae normal.     Right eye: Right conjunctiva is not injected.     Left eye:  Left conjunctiva is not injected.     Pupils: Pupils are equal, round, and reactive to light.  Neck:     Thyroid: No thyromegaly.     Vascular: No JVD.     Trachea: No tracheal deviation.  Cardiovascular:     Rate and Rhythm: Normal rate and regular rhythm.     Heart sounds: Normal heart sounds, S1 normal and S2 normal. No murmur heard. No systolic murmur is present.  No diastolic murmur is present.    No friction rub. No gallop. No S3 or S4 sounds.  Pulmonary:     Effort: Pulmonary effort is normal. No respiratory distress.     Breath sounds: Normal breath sounds. No wheezing, rhonchi or rales.  Abdominal:     General: Bowel sounds are normal.     Palpations: Abdomen is soft. There is no mass.  Tenderness: There is no abdominal tenderness. There is no guarding or rebound.  Musculoskeletal:        General: No tenderness. Normal range of motion.     Cervical back: Normal range of motion and neck supple.  Lymphadenopathy:     Cervical: No cervical adenopathy.  Skin:    General: Skin is warm.     Findings: No rash.  Neurological:     Mental Status: She is alert and oriented to person, place, and time.     Cranial Nerves: No cranial nerve deficit.     Deep Tendon Reflexes: Reflexes normal.  Psychiatric:        Mood and Affect: Mood is not anxious or depressed.    Wt Readings from Last 3 Encounters:  01/15/22 144 lb (65.3 kg)  12/17/21 141 lb (64 kg)  12/15/21 143 lb (64.9 kg)    BP 128/70    Pulse 80    Ht 5\' 5"  (1.651 m)    Wt 144 lb (65.3 kg)    LMP 12/21/2021 (Approximate)    BMI 23.96 kg/m   Assessment and Plan:  1. Congenital hypothyroidism without goiter Chronic.  Controlled.  Stable.  Continue levothyroxine 100 mcg once a day. - levothyroxine (SYNTHROID) 100 MCG tablet; Take 1 tablet (100 mcg total) by mouth daily before breakfast.  Dispense: 90 tablet; Refill: 1 - Thyroid Panel With TSH  2. Mild intermittent reactive airway disease with acute  exacerbation Chronic.  Controlled.  Stable.  Continue Singulair 10 mg once a day. - montelukast (SINGULAIR) 10 MG tablet; Take 1 tablet (10 mg total) by mouth at bedtime.  Dispense: 90 tablet; Refill: 1 - Lipid Panel With LDL/HDL Ratio  3. Aphthous ulcer Chronic.  Controlled.  Episodic.  Plan exposure and exacerbation patient is to take 1 tablet twice a day for 2 days and apply Denavir cream to area every 2 hours as needed. - valACYclovir (VALTREX) 1000 MG tablet; Take 1 tablet (1,000 mg total) by mouth 2 (two) times daily for 10 days.  Dispense: 20 tablet; Refill: 0 - penciclovir (DENAVIR) 1 % cream; Apply 1 application topically every 2 (two) hours.  Dispense: 1.5 g; Refill: 0

## 2022-01-16 ENCOUNTER — Encounter: Payer: Self-pay | Admitting: Family Medicine

## 2022-01-16 ENCOUNTER — Other Ambulatory Visit: Payer: Self-pay

## 2022-01-16 DIAGNOSIS — E785 Hyperlipidemia, unspecified: Secondary | ICD-10-CM

## 2022-01-16 LAB — THYROID PANEL WITH TSH
Free Thyroxine Index: 1.6 (ref 1.2–4.9)
T3 Uptake Ratio: 19 % — ABNORMAL LOW (ref 24–39)
T4, Total: 8.3 ug/dL (ref 4.5–12.0)
TSH: 0.924 u[IU]/mL (ref 0.450–4.500)

## 2022-01-16 LAB — LIPID PANEL WITH LDL/HDL RATIO
Cholesterol, Total: 286 mg/dL — ABNORMAL HIGH (ref 100–199)
HDL: 99 mg/dL (ref >39)
LDL Chol Calc (NIH): 168 mg/dL — ABNORMAL HIGH (ref 0–99)
LDL/HDL Ratio: 1.7 ratio (ref 0.0–3.2)
Triglycerides: 113 mg/dL (ref 0–149)
VLDL Cholesterol Cal: 19 mg/dL (ref 5–40)

## 2022-01-16 MED ORDER — ATORVASTATIN CALCIUM 10 MG PO TABS: 10.0000 mg | ORAL_TABLET | Freq: Every day | ORAL | 0 refills | Status: DC

## 2022-01-19 ENCOUNTER — Other Ambulatory Visit: Payer: Self-pay

## 2022-01-19 DIAGNOSIS — Z1211 Encounter for screening for malignant neoplasm of colon: Secondary | ICD-10-CM | POA: Diagnosis not present

## 2022-01-19 LAB — COLOGUARD: Cologuard: NEGATIVE

## 2022-01-19 NOTE — Progress Notes (Signed)
Pt wants to try diet first

## 2022-01-21 ENCOUNTER — Other Ambulatory Visit: Payer: Self-pay | Admitting: Family Medicine

## 2022-01-21 DIAGNOSIS — K120 Recurrent oral aphthae: Secondary | ICD-10-CM

## 2022-01-21 NOTE — Telephone Encounter (Signed)
Was ordered for 10 days only. Requested Prescriptions  Pending Prescriptions Disp Refills   valACYclovir (VALTREX) 1000 MG tablet [Pharmacy Med Name: VALACYCLOVIR HCL 1 GRAM TABLET] 20 tablet 0    Sig: TAKE ONE TABLET BY MOUTH TWICE A DAY FOR 10 DAYS     Antimicrobials:  Antiviral Agents - Anti-Herpetic Passed - 01/21/2022  6:22 AM      Passed - Valid encounter within last 12 months    Recent Outpatient Visits          6 days ago Aphthous ulcer   [facility] Duanne Limerick, MD   1 month ago Chest pain, unspecified type   [facility] Duanne Limerick, MD   2 months ago Dysfunction of both eustachian tubes   [facility] Duanne Limerick, MD   5 months ago Parkinson's disease [facility])   [facility] Duanne Limerick, MD   7 months ago Congenital hypothyroidism without goiter   [facility] Duanne Limerick, MD      Future Appointments            In 1 week Judi Saa, DO [facility]   In 5 months Duanne Limerick, MD [facility]

## 2022-01-23 ENCOUNTER — Telehealth: Payer: Self-pay | Admitting: Internal Medicine

## 2022-01-23 ENCOUNTER — Other Ambulatory Visit: Payer: Self-pay

## 2022-01-23 ENCOUNTER — Encounter: Payer: Self-pay | Admitting: Internal Medicine

## 2022-01-23 ENCOUNTER — Ambulatory Visit
Admission: RE | Admit: 2022-01-23 | Discharge: 2022-01-23 | Disposition: A | Discharge status: Home / Self Care | Payer: BC Managed Care – PPO | Source: Ambulatory Visit | Attending: Internal Medicine | Admitting: Internal Medicine

## 2022-01-23 ENCOUNTER — Ambulatory Visit (INDEPENDENT_AMBULATORY_CARE_PROVIDER_SITE_OTHER): Payer: BC Managed Care – PPO | Admitting: Internal Medicine

## 2022-01-23 ENCOUNTER — Ambulatory Visit
Admission: RE | Admit: 2022-01-23 | Discharge: 2022-01-23 | Disposition: A | Discharge status: Home / Self Care | Payer: BC Managed Care – PPO | Attending: Internal Medicine | Admitting: Internal Medicine

## 2022-01-23 ENCOUNTER — Other Ambulatory Visit
Admission: RE | Admit: 2022-01-23 | Discharge: 2022-01-23 | Disposition: A | Discharge status: Home / Self Care | Payer: BC Managed Care – PPO | Source: Home / Self Care | Attending: Internal Medicine | Admitting: Internal Medicine

## 2022-01-23 DIAGNOSIS — R0602 Shortness of breath: Secondary | ICD-10-CM | POA: Diagnosis not present

## 2022-01-23 DIAGNOSIS — R0609 Other forms of dyspnea: Secondary | ICD-10-CM | POA: Diagnosis not present

## 2022-01-23 LAB — SEDIMENTATION RATE: Sed Rate: 5 mm/hr (ref 0–30)

## 2022-01-23 LAB — CBC WITH DIFFERENTIAL/PLATELET
Abs Immature Granulocytes: 0.01 10*3/uL (ref 0.00–0.07)
Basophils Absolute: 0.1 10*3/uL (ref 0.0–0.1)
Basophils Relative: 2 %
Eosinophils Absolute: 0.1 10*3/uL (ref 0.0–0.5)
Eosinophils Relative: 2 %
HCT: 41.2 % (ref 36.0–46.0)
Hemoglobin: 13.5 g/dL (ref 12.0–15.0)
Immature Granulocytes: 0 %
Lymphocytes Relative: 38 %
Lymphs Abs: 1.9 10*3/uL (ref 0.7–4.0)
MCH: 31.2 pg (ref 26.0–34.0)
MCHC: 32.8 g/dL (ref 30.0–36.0)
MCV: 95.2 fL (ref 80.0–100.0)
Monocytes Absolute: 0.4 10*3/uL (ref 0.1–1.0)
Monocytes Relative: 8 %
Neutro Abs: 2.5 10*3/uL (ref 1.7–7.7)
Neutrophils Relative %: 50 %
Platelets: 303 10*3/uL (ref 150–400)
RBC: 4.33 MIL/uL (ref 3.87–5.11)
RDW: 13.4 % (ref 11.5–15.5)
WBC: 4.9 10*3/uL (ref 4.0–10.5)
nRBC: 0 % (ref 0.0–0.2)

## 2022-01-23 LAB — BASIC METABOLIC PANEL
Anion gap: 9 (ref 5–15)
BUN: 13 mg/dL (ref 6–20)
CO2: 28 mmol/L (ref 22–32)
Calcium: 9.2 mg/dL (ref 8.9–10.3)
Chloride: 102 mmol/L (ref 98–111)
Creatinine, Ser: 0.88 mg/dL (ref 0.44–1.00)
GFR, Estimated: >60 mL/min (ref >60)
Glucose, Bld: 97 mg/dL (ref 70–99)
Potassium: 4.1 mmol/L (ref 3.5–5.1)
Sodium: 139 mmol/L (ref 135–145)

## 2022-01-23 LAB — BRAIN NATRIURETIC PEPTIDE: B Natriuretic Peptide: 15.1 pg/mL (ref 0.0–100.0)

## 2022-01-23 LAB — D-DIMER, QUANTITATIVE: D-Dimer, Quant: <0.27 ug/mL-FEU (ref 0.00–0.50)

## 2022-01-23 IMAGING — CR DG CHEST 2V
1 series · 2 of 2 positions shown · non-contrast
Comparison: [DATE]

CLINICAL DATA: 51-year-old female with shortness of breath and
history of COVID

EXAM:
CHEST - 2 VIEW

[Series 1: dg chest 2 view · 0.14mm/px · 2 of 2 slices shown]
[im 1/2]
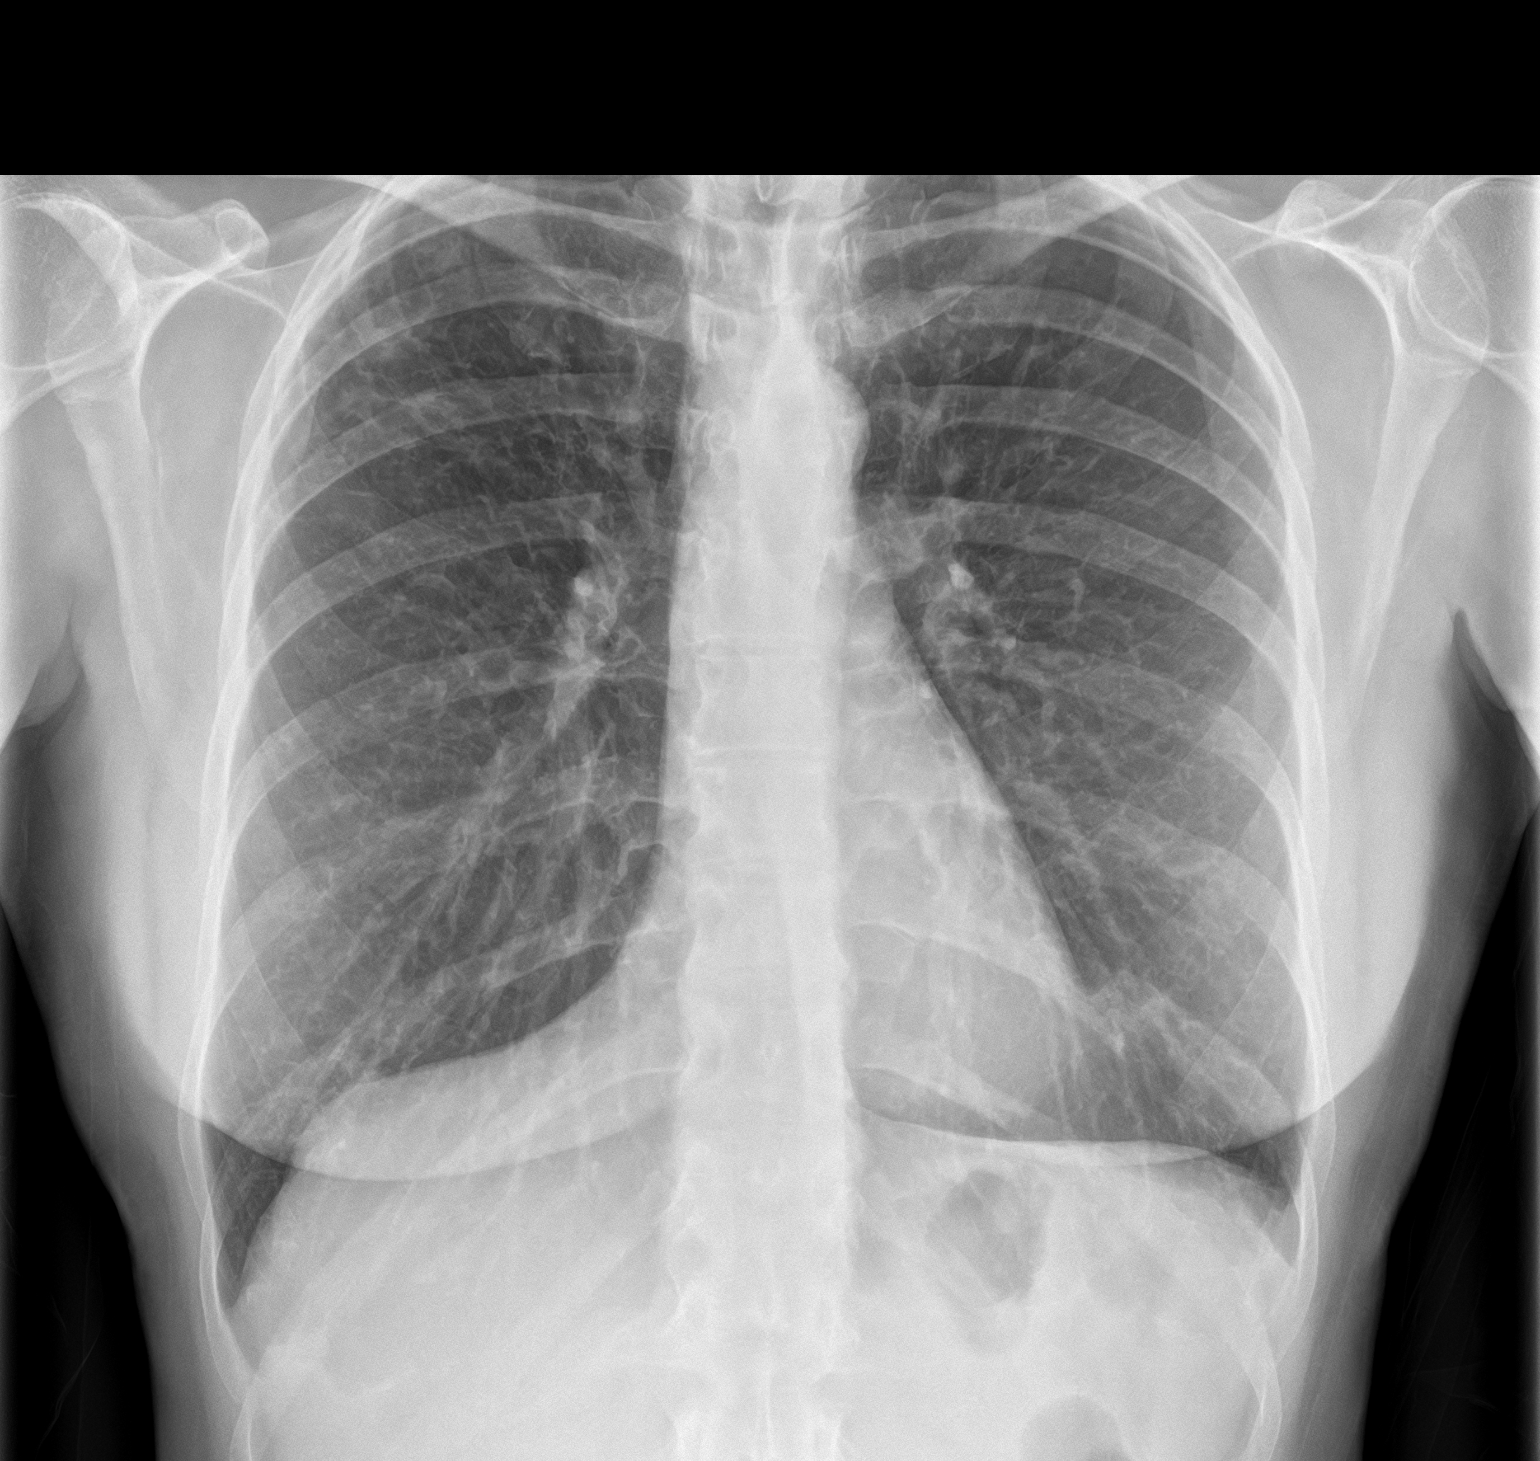
[im 2/2]
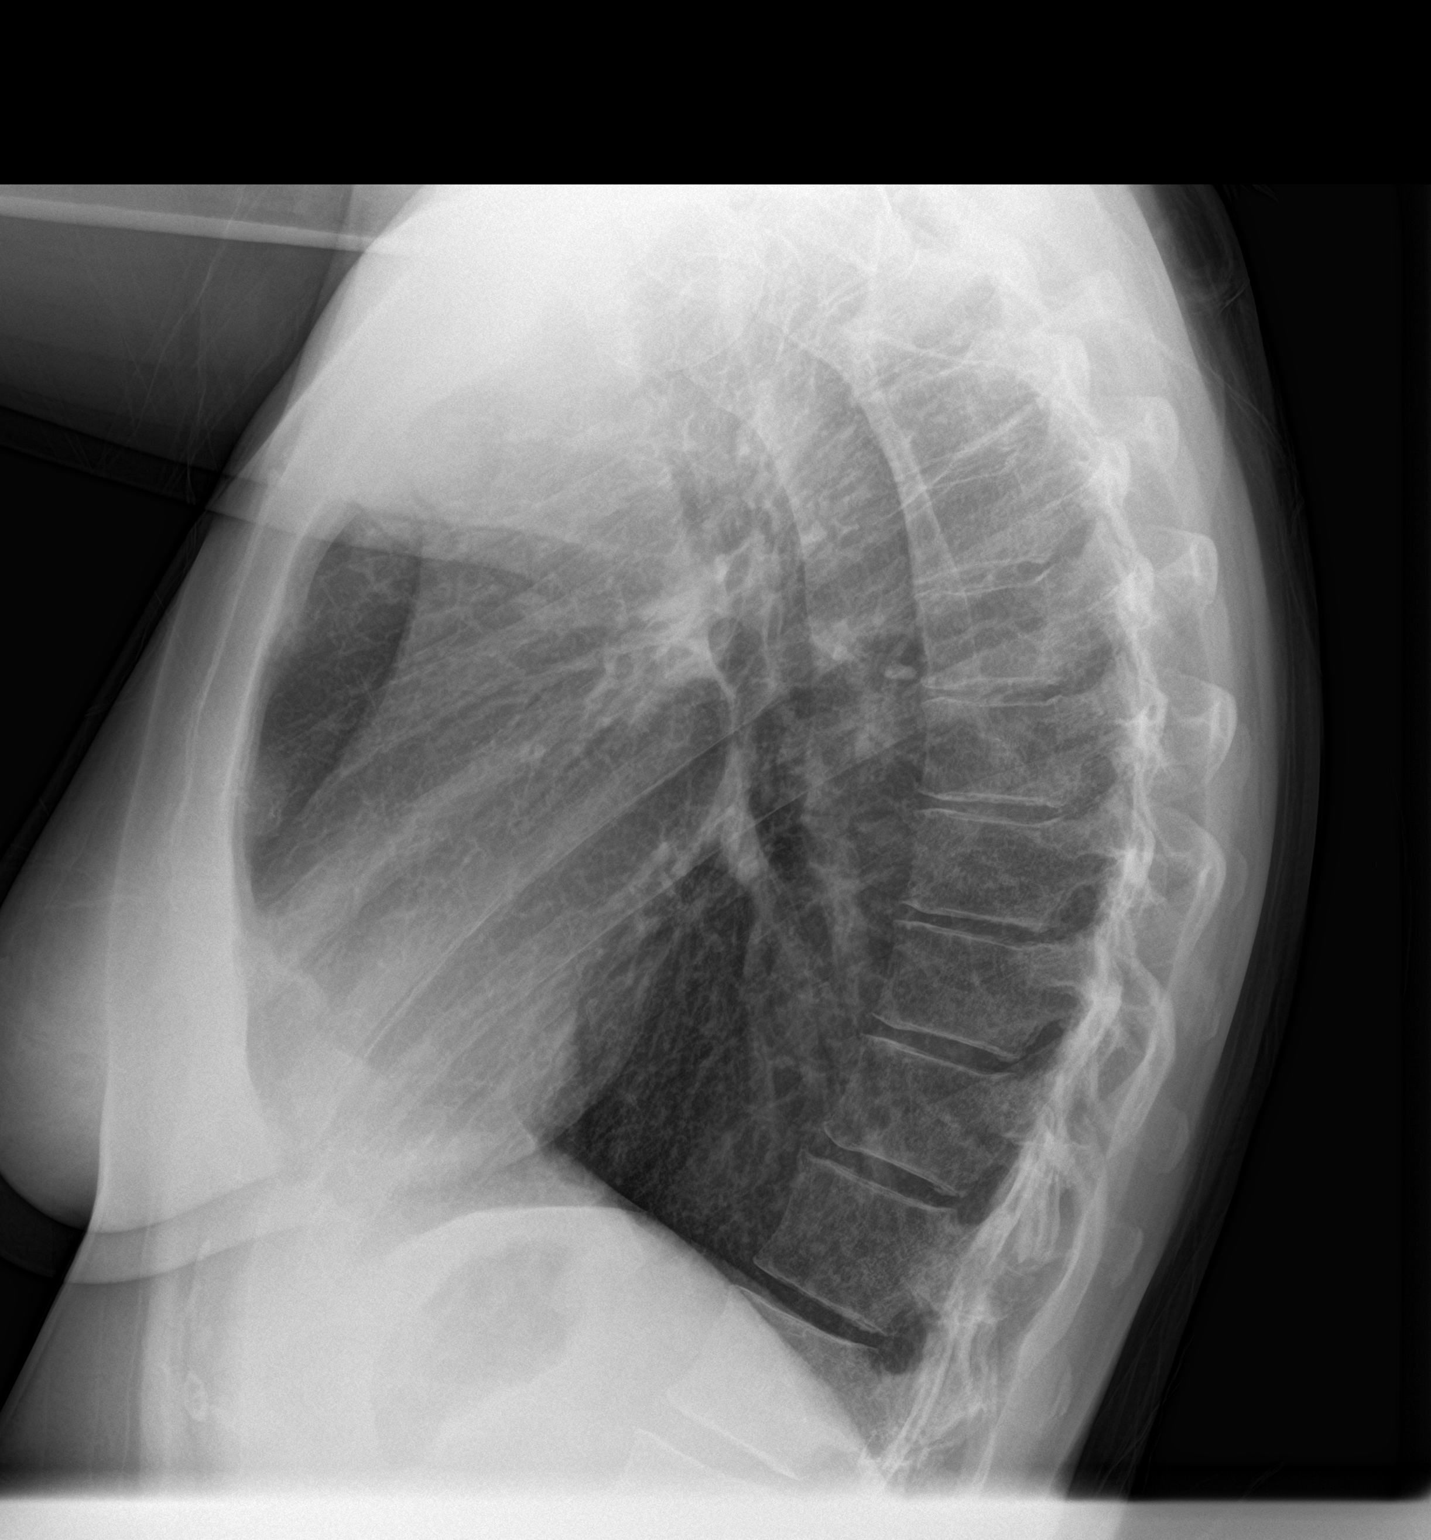

[2 of 2 positions shown; findings below may reference images not displayed]

FINDINGS: Increased reticulonodular opacities of the lungs, particularly at
the right greater than left lung bases.

Unchanged cardiomediastinal silhouette.

No pneumothorax or pleural effusion. No interlobular septal
thickening.

No displaced fracture.  Degenerative changes the spine
IMPRESSION: Reticulonodular opacities of the lungs, compatible with sequela
atypical infection.

## 2022-01-23 MED ORDER — BREZTRI AEROSPHERE 160-9-4.8 MCG/ACT IN AERO: 2.0000 | INHALATION_SPRAY | Freq: Two times a day (BID) | RESPIRATORY_TRACT | 3 refills | Status: DC

## 2022-01-23 MED ORDER — FAMOTIDINE 20 MG PO TABS: 20.0000 mg | ORAL_TABLET | Freq: Every day | ORAL | 0 refills | Status: DC

## 2022-01-23 MED ORDER — PANTOPRAZOLE SODIUM 40 MG PO TBEC: 40.0000 mg | DELAYED_RELEASE_TABLET | Freq: Every day | ORAL | 0 refills | Status: DC

## 2022-01-23 NOTE — Patient Instructions (Addendum)
Plan A = Automatic = Always=    Breztri Take 2 puffs first thing in am and then another 2 puffs about 12 hours later. And also continue singulair.    Work on inhaler technique:  relax and gently blow all the way out then take a nice smooth full deep breath back in, triggering the inhaler at same time you start breathing in.  Hold for up to 5 seconds if you can. Blow breztri out thru nose. Rinse and gargle with water when done.  If mouth or throat bother you at all,  try brushing teeth/gums/tongue with arm and hammer toothpaste/ make a slurry and gargle and spit out.   Pantoprazole (protonix) 40 mg   Take  30-60 min before first meal of the day and Pepcid (famotidine)  20 mg after supper until return to office - this is the best way to tell whether stomach acid is contributing to your problem.      Plan B = Backup (to supplement plan A, not to replace it) Only use your albuterol inhaler as a rescue medication to be used if you can't catch your breath by resting or doing a relaxed purse lip breathing pattern.  - The less you use it, the better it will work when you need it. - Ok to use the inhaler up to 2 puffs  every 4 hours if you must but call for appointment if use goes up over your usual need - Don't leave home without it !!  (think of it like the spare tire for your car)   Ok to try albuterol 15 min before an activity (on alternating days)  that you know would usually make you short of breath and see if it makes any difference and if makes none then don't take albuterol after activity unless you can't catch your breath as this means it's the resting that helps, not the albuterol.  GERD (REFLUX)  is an extremely common cause of respiratory symptoms just like yours , many times with no obvious heartburn at all.    It can be treated with medication, but also with lifestyle changes including elevation of the head of your bed (ideally with 6 -8inch blocks under the headboard of your bed),  Smoking  cessation, avoidance of late meals, excessive alcohol, and avoid fatty foods, chocolate, peppermint, colas, red wine, and acidic juices such as orange juice.  NO MINT OR MENTHOL PRODUCTS SO NO COUGH DROPS  USE SUGARLESS CANDY INSTEAD (Jolley ranchers or Stover's or Life Savers) or even ice chips will also do - the key is to swallow to prevent all throat clearing. NO OIL BASED VITAMINS - use powdered substitutes.  Avoid fish oil when coughing.    Please remember to go to the lab and x-ray department  for your tests - we will call you with the results when they are available.   Please schedule a follow up office visit in 4 weeks, sooner if needed

## 2022-01-23 NOTE — Progress Notes (Signed)
Stephanie Mcguire, female    DOB: November 12, 1971  MRN: [id number]   Brief patient profile:  83   yowf   never smoker  referred to pulmonary clinic in [facility]  01/23/2022 by Dr Otilio Miu for post covid doe and chest tightness.  Eval for ? EIA vs vcd and ? ILD byFleming  but resolved p a year of singulair /saba 2019 and stopped them but since then able to do elipitical x 30 min s sob  but then covid  p at least 2 vax presented with cp > ER 12/25/2020 rx zofran  then 02/05/21 [facility] cxr Patchy opacities  noted at the peripheral right upper lobe. Additional more subtle hazy opacities are also seen at the right lung base rx ? But cough / chest tightness never better   History of Present Illness  01/23/2022  Pulmonary/ 1st office eval/ Siyona Coto / [employer]  Chief Complaint  Patient presents with   Consult  Dyspnea:  assoc with persistent daily chest pressure ever since covid / now bike riding s flare with ex Cough: still clearing throat a lot, no excess mucus. Sleep: on side flat bed on 2 pillows minimal chest tightness and never wakes up with it SABA use: twice daily doesn't really help much but using less since started Breztri  No obvious day to day or daytime variability or assoc excess/ purulent sputum or mucus plugs or hemoptysis or  objective wheeze or overt sinus or hb symptoms.   sleeping without nocturnal  or early am exacerbation  of respiratory  c/o's or need for noct saba. Also denies any obvious fluctuation of symptoms with weather or environmental changes or other aggravating or alleviating factors except as outlined above   No unusual exposure hx or h/o childhood pna/ asthma or knowledge of premature birth.  Current Allergies, Complete Past Medical History, Past Surgical History, Family History, and Social History were reviewed in [employer] record.  ROS  The following are not active complaints unless bolded Hoarseness, sore throat, dysphagia, dental  problems, itching, sneezing,  nasal congestion or discharge of excess mucus or purulent secretions, ear ache,   fever, chills, sweats, unintended wt loss or wt gain, classically pleuritic or exertional cp,  orthopnea pnd or arm/hand swelling  or leg swelling, presyncope, palpitations, abdominal pain, anorexia, nausea, vomiting, diarrhea  or change in bowel habits or change in bladder habits, change in stools or change in urine, dysuria, hematuria,  rash, arthralgias, visual complaints, headache, numbness, weakness or ataxia or problems with walking or coordination,  change in mood or  memory.           Past Medical History:  Diagnosis Date   Anxiety    Arthritis    spondylosis   Asthma    Bipolar 2 disorder ([place])    Bipolar disease, chronic ([facility])    Depression    Thyroid disease     Outpatient Medications Prior to Visit  Medication Sig Dispense Refill   albuterol (VENTOLIN HFA) 108 (90 Base) MCG/ACT inhaler Inhale 2 puffs into the lungs every 6 (six) hours as needed for wheezing or shortness of breath. 54 g 1   benzonatate (TESSALON PERLES) 100 MG capsule Take 1 capsule (100 mg total) by mouth 3 (three) times daily as needed for cough. 30 capsule 2   Budeson-Glycopyrrol-Formoterol (BREZTRI AEROSPHERE) 160-9-4.8 MCG/ACT AERO Inhale 2 puffs into the lungs 2 (two) times daily. 10.7 g 1   carbamazepine (TEGRETOL) 200 MG tablet 600  mg daily. Dr Evelene Croon     carbamide peroxide (DEBROX) 6.5 % OTIC solution Place 5 drops into both ears 2 (two) times daily. 15 mL 2       0   fluticasone (FLONASE) 50 MCG/ACT nasal spray Place 2 sprays into both nostrils daily. 16 g 0   lamoTRIgine (LAMICTAL) 200 MG tablet Take 200 mg by mouth daily. Evelene Croon     levothyroxine (SYNTHROID) 100 MCG tablet Take 1 tablet (100 mcg total) by mouth daily before breakfast. 90 tablet 1   LORazepam (ATIVAN) 1 MG tablet Take 1 mg by mouth daily as needed. Dr Evelene Croon     montelukast (SINGULAIR) 10 MG tablet Take 1 tablet (10 mg total) by  mouth at bedtime. 90 tablet 1   norethindrone-ethinyl estradiol (LOESTRIN) 1-20 MG-MCG tablet Take 1 tablet by mouth daily. 84 tablet 3   Ergocalciferol 50 MCG (2000 UT) TABS Take 1 capsule by mouth daily. neuro         0   penciclovir (DENAVIR) 1 % cream Apply 1 application topically every 2 (two) hours. 1.5 g 0   valACYclovir (VALTREX) 1000 MG tablet Take 1 tablet (1,000 mg total) by mouth 2 (two) times daily for 10 days. 20 tablet 0   No facility-administered medications prior to visit.     Objective:     BP 110/80 (BP Location: Left Arm, Patient Position: Sitting, Cuff Size: Normal)    Pulse 82    Temp (!) 97.3 F (36.3 C) (Oral)    Ht 5\' 5"  (1.651 m)    Wt 142 lb 9.6 oz (64.7 kg)    SpO2 99%    BMI 23.73 kg/m   SpO2: 99 % Ra  Amb pleasant wf nad / mod pseudowheeze    HEENT : pt wearing mask not removed for exam due to covid -19 concerns.    NECK :  without JVD/Nodes/TM/ nl carotid upstrokes bilaterally   LUNGS: no acc muscle use,  Nl contour chest with insp squeaks/wheeze upper lobes vs referred from upper airway    CV:  RRR  no s3 or murmur or increase in P2, and no edema   ABD:  soft and nontender with nl inspiratory excursion in the supine position. No bruits or organomegaly appreciated, bowel sounds nl  MS:  Nl gait/ ext warm without deformities, calf tenderness, cyanosis or clubbing No obvious joint restrictions   SKIN: warm and dry without lesions    NEURO:  alert, approp, nl sensorium with  no motor or cerebellar deficits apparent.    CXR PA and Lateral:   01/23/2022 :    I personally reviewed images and agree with radiology impression as follows:    Reticulonodular opacities of the lungs, compatible with sequela atypical infection. My review:  no impressive infiltrates      Labs ordered/ reviewed:      Chemistry      Component Value Date/Time   NA 139 01/23/2022 1136   K 4.1 01/23/2022 1136   CL 102 01/23/2022 1136   CO2 28 01/23/2022 1136   BUN  13 01/23/2022 1136   CREATININE 0.88 01/23/2022 1136      Component Value Date/Time   CALCIUM 9.2 01/23/2022 1136        Lab Results  Component Value Date   WBC 4.9 01/23/2022   HGB 13.5 01/23/2022   HCT 41.2 01/23/2022   MCV 95.2 01/23/2022   PLT 303 01/23/2022     Lab Results  Component Value Date  DDIMER <0.27 01/23/2022      Lab Results  Component Value Date   TSH 0.924 01/15/2022      BNP   01/23/2022      =    15.1     Lab Results  Component Value Date   ESRSEDRATE 5 01/23/2022       Assessment   No problem-specific Assessment & Plan notes found for this encounter.     Christinia Gully, MD 01/23/2022

## 2022-01-23 NOTE — Assessment & Plan Note (Signed)
Onset with covid 12/25/20 assoc with atypical cp/ chest tightness not resolved on breztri - 01/23/2022  After extensive coaching inhaler device,  effectiveness =    75% from baseline 50%   - 01/23/2022 max gerd rx and f/u in 4-6 weeks   Ongoing daily symptoms disproportionate to objective findings and not clear to what extent this is actually a pulmonary  problem but pt does appear to have difficult to sort out respiratory symptoms of unknown origin for which  DDX  = almost all start with A and  include Adherence(work on HFA), Ace Inhibitors, Acid Reflux(added ppi/h2 today) , Active Sinus Disease, Alpha 1 Antitripsin deficiency, Anxiety masquerading as Airways dz (dx of exclusion)  ABPA,  Allergy(esp in young), Aspiration (esp in elderly), Adverse effects of meds,  Active smoking or Vaping, A bunch of PEs (ruled out today)    Anemia or thyroid disorder(ruled out today , plus two Bs  = Bronchiectasis (need hrct to exclude)  and Beta blocker use..and one C= CHF     Not clear there is really any asthma here with exam more c/w vcd and the chest tightness refractory to breztri more c/w gerd than any cardiac or pulmonary source so rec   1) work on perfecting hfa 2) add max rx for GERD and diet 3) continue daily ex checking sats at peak 4) f/u in 4 weeks with consideration for hrct/ pfts next         Each maintenance medication was reviewed in detail including emphasizing most importantly the difference between maintenance and prns and under what circumstances the prns are to be triggered using an action plan format where appropriate.  Total time for H and P, chart review, counseling, reviewing hfa device(s) and generating customized AVS unique to this office visit / same day charting  > 45 min

## 2022-01-23 NOTE — Addendum Note (Signed)
Addended by: Lavona Mound on: 01/23/2022 03:17 PM   Modules accepted: Orders

## 2022-01-26 LAB — COLOGUARD: COLOGUARD: NEGATIVE

## 2022-01-26 MED ORDER — BREZTRI AEROSPHERE 160-9-4.8 MCG/ACT IN AERO: 2.0000 | INHALATION_SPRAY | Freq: Two times a day (BID) | RESPIRATORY_TRACT | 3 refills | Status: DC

## 2022-01-26 NOTE — Telephone Encounter (Signed)
Protonix was sent to pharmacy on 01/23/2022. Spoke to patient and confirmed that she did pick up Rx.  Breztri has been sent to [facility] per patient request.  Nothing further needed.

## 2022-01-27 NOTE — Progress Notes (Signed)
[name] [address] [place] [address] Phone: [phone] Subjective:   Stephanie Mcguire, am serving as a scribe for Dr. Antoine Primas. This visit occurred during the SARS-CoV-2 public health emergency.  Safety protocols were in place, including screening questions prior to the visit, additional usage of staff PPE, and extensive cleaning of exam room while observing appropriate contact time as indicated for disinfecting solutions.   I'm seeing this patient by the request  of:  Duanne Limerick, MD  CC: Right arm pain follow-up  [id number]  12/17/2021 Patient does have a nerve conduction study that was fairly inconclusive but potentially some mild carpal tunnel.  Patient's ultrasound today does show likely this is contributing to more the discomfort as well.  At this moment patient will try bracing, home exercises, icing regimen.  Worsening pain will consider the possibility of injections.  Patient is also given gabapentin that I think could be beneficial.  Right elbow exam site seems to be better at this point.  X-rays do show that there may have been a remote injury to the radial head.  With findings of the right wrist and more concerning for the carpal tunnel I think this is contributing to more of the elbow pain as well.  We will treat more for the carpal tunnel and see how patient responds.  Update 01/28/2022 Stephanie Mcguire is a 51 y.o. female coming in with complaint of R elbow pain. Patient states overall not bad. Better than the last appointment. Carpal tunnel is a little better. No new complaints.  Patient would state approximately 50% better.  Nothing that is stopping her from activity at this moment.    Past Medical History:  Diagnosis Date   Anxiety    Arthritis    spondylosis   Asthma    Bipolar 2 disorder (HCC)    Bipolar disease, chronic (HCC)    Depression    Thyroid disease    Past Surgical History:  Procedure Laterality  Date   BUNIONECTOMY     MANDIBLE SURGERY  2009   RECONSTRUCTION OF EYELID  1989 and 1990   Social History   Socioeconomic History   Marital status: Married    Spouse name: Not on file   Number of children: Not on file   Years of education: Not on file   Highest education level: Not on file  Occupational History   Not on file  Tobacco Use   Smoking status: Never   Smokeless tobacco: Never  Vaping Use   Vaping Use: Never used  Substance and Sexual Activity   Alcohol use: Yes    Alcohol/week: 2.0 standard drinks    Types: 2 Glasses of wine per week   Drug use: No   Sexual activity: Yes    Birth control/protection: Pill  Other Topics Concern   Not on file  Social History Narrative   Not on file   Social Determinants of Health   Financial Resource Strain: Not on file  Food Insecurity: Not on file  Transportation Needs: Not on file  Physical Activity: Not on file  Stress: Not on file  Social Connections: Not on file   No Known Allergies Family History  Problem Relation Age of Onset   Parkinson's disease Mother    Hypertension Father    Stroke Maternal Grandmother     Current Outpatient Medications (Endocrine & Metabolic):    levothyroxine (SYNTHROID) 100 MCG tablet, Take 1 tablet (100 mcg total) by  mouth daily before breakfast.   norethindrone-ethinyl estradiol (LOESTRIN) 1-20 MG-MCG tablet, Take 1 tablet by mouth daily.   Current Outpatient Medications (Respiratory):    albuterol (VENTOLIN HFA) 108 (90 Base) MCG/ACT inhaler, Inhale 2 puffs into the lungs every 6 (six) hours as needed for wheezing or shortness of breath.   benzonatate (TESSALON PERLES) 100 MG capsule, Take 1 capsule (100 mg total) by mouth 3 (three) times daily as needed for cough.   Budeson-Glycopyrrol-Formoterol (BREZTRI AEROSPHERE) 160-9-4.8 MCG/ACT AERO, Inhale 2 puffs into the lungs in the morning and at bedtime.   cetirizine-pseudoephedrine (ZYRTEC-D) 5-120 MG tablet, Take 1 tablet by mouth 2  (two) times daily as needed for allergies.   fluticasone (FLONASE) 50 MCG/ACT nasal spray, Place 2 sprays into both nostrils daily.   montelukast (SINGULAIR) 10 MG tablet, Take 1 tablet (10 mg total) by mouth at bedtime.    Current Outpatient Medications (Other):    gabapentin (NEURONTIN) 100 MG capsule, Take 2 capsules (200 mg total) by mouth at bedtime.   carbamazepine (TEGRETOL) 200 MG tablet, 600 mg daily. Dr Evelene Croon   carbamide peroxide [facility]) 6.5 % OTIC solution, Place 5 drops into both ears 2 (two) times daily.   famotidine (PEPCID) 20 MG tablet, Take 1 tablet (20 mg total) by mouth daily.   lamoTRIgine (LAMICTAL) 200 MG tablet, Take 200 mg by mouth daily. [facility]   LORazepam (ATIVAN) 1 MG tablet, Take 1 mg by mouth daily as needed. Dr Evelene Croon   pantoprazole (PROTONIX) 40 MG tablet, Take 1 tablet (40 mg total) by mouth daily.   Reviewed prior external information including notes and imaging from  primary care provider As well as notes that were available from care everywhere and other healthcare systems.  Past medical history, social, surgical and family history all reviewed in electronic medical record.  No pertanent information unless stated regarding to the chief complaint.   Review of Systems:  No headache, visual changes, nausea, vomiting, diarrhea, constipation, dizziness, abdominal pain, skin rash, fevers, chills, night sweats, weight loss, swollen lymph nodes, body aches, joint swelling, chest pain, shortness of breath, mood changes. POSITIVE muscle aches  Objective  Blood pressure 118/80, pulse 91, height 5\' 5"  (1.651 m), weight 144 lb (65.3 kg), SpO2 97 %.   General: No apparent distress alert and oriented x3 mood and affect normal, dressed appropriately.  HEENT: Pupils equal, extraocular movements intact  Respiratory: Patient's speak in full sentences and does not appear short of breath  Cardiovascular: No lower extremity edema, non tender, no erythema  Gait normal with  good balance and coordination.  MSK:   Patient's right wrist does have a positive Tinel's noted.  No significant swelling.  Good grip strength noted.  No significant decreased range of motion of the elbow.  Limited muscular skeletal ultrasound was performed and interpreted by , M  Limited ultrasound of patient's median nerve still has some mild hypoechoic changes surrounding the area with very mild dilatation of the nerve but otherwise fairly unremarkable. Impression: We will continue to carpal tunnel   Impression and Recommendations:     The above documentation has been reviewed and is accurate and complete Antoine Primas, DO

## 2022-01-28 ENCOUNTER — Other Ambulatory Visit: Payer: Self-pay

## 2022-01-28 ENCOUNTER — Ambulatory Visit (INDEPENDENT_AMBULATORY_CARE_PROVIDER_SITE_OTHER): Payer: BC Managed Care – PPO | Admitting: Family Medicine

## 2022-01-28 ENCOUNTER — Ambulatory Visit: Payer: Self-pay

## 2022-01-28 ENCOUNTER — Encounter: Payer: Self-pay | Admitting: Family Medicine

## 2022-01-28 VITALS — BP 118/80 | HR 91 | Ht 65.0 in | Wt 144.0 lb

## 2022-01-28 DIAGNOSIS — G5601 Carpal tunnel syndrome, right upper limb: Secondary | ICD-10-CM | POA: Diagnosis not present

## 2022-01-28 MED ORDER — GABAPENTIN 100 MG PO CAPS: 200.0000 mg | ORAL_CAPSULE | Freq: Every day | ORAL | 0 refills | Status: DC

## 2022-01-28 NOTE — Assessment & Plan Note (Signed)
Patient still has mild enlargement and a positive Tinel's.  We discussed with patient to continue to monitor and keep doing the exercises occasionally.  With patient doing much better even with her not taking the gabapentin regularly I do anticipate patient is going to make improvement.  Worsening pain still consider the possibility of occupational therapy and injections.  Follow-up with me again as needed

## 2022-01-29 DIAGNOSIS — H6123 Impacted cerumen, bilateral: Secondary | ICD-10-CM | POA: Diagnosis not present

## 2022-01-29 DIAGNOSIS — H9319 Tinnitus, unspecified ear: Secondary | ICD-10-CM | POA: Diagnosis not present

## 2022-01-29 DIAGNOSIS — H9313 Tinnitus, bilateral: Secondary | ICD-10-CM | POA: Diagnosis not present

## 2022-01-29 DIAGNOSIS — H6983 Other specified disorders of Eustachian tube, bilateral: Secondary | ICD-10-CM | POA: Diagnosis not present

## 2022-02-16 ENCOUNTER — Ambulatory Visit: Payer: BC Managed Care – PPO | Admitting: Sports Medicine

## 2022-02-18 DIAGNOSIS — Z23 Encounter for immunization: Secondary | ICD-10-CM | POA: Diagnosis not present

## 2022-02-19 ENCOUNTER — Other Ambulatory Visit: Payer: Self-pay | Admitting: Family Medicine

## 2022-02-19 DIAGNOSIS — J849 Interstitial pulmonary disease, unspecified: Secondary | ICD-10-CM

## 2022-02-26 ENCOUNTER — Encounter: Payer: Self-pay | Admitting: Internal Medicine

## 2022-02-26 ENCOUNTER — Ambulatory Visit (INDEPENDENT_AMBULATORY_CARE_PROVIDER_SITE_OTHER): Payer: BC Managed Care – PPO | Admitting: Internal Medicine

## 2022-02-26 ENCOUNTER — Other Ambulatory Visit: Payer: Self-pay

## 2022-02-26 DIAGNOSIS — R0609 Other forms of dyspnea: Secondary | ICD-10-CM

## 2022-02-26 MED ORDER — FAMOTIDINE 20 MG PO TABS: 20.0000 mg | ORAL_TABLET | Freq: Every day | ORAL | 3 refills | Status: DC

## 2022-02-26 MED ORDER — PANTOPRAZOLE SODIUM 40 MG PO TBEC: 40.0000 mg | DELAYED_RELEASE_TABLET | Freq: Every day | ORAL | 3 refills | Status: DC

## 2022-02-26 NOTE — Assessment & Plan Note (Signed)
Onset with covid 12/25/20 assoc with atypical cp/ chest tightness not resolved on breztri ?-  01/23/22   Walked on RA  x  3  lap(s) =  approx 525  ft  @ fast pace, stopped due to end of study with lowest 02 sats 99%  ? - 01/23/2022 max gerd rx  > improved 02/26/2022  ?- 02/26/2022  After extensive coaching inhaler device,  effectiveness =    90%> continue breztri x one week at 2 bid and then stop to see if breathing or changes in sob or cough   ? ?Not clear at all whether there is significant lung dz here but DDX of  difficult airways management almost all start with A and  include Adherence, Ace Inhibitors, Acid Reflux, Active Sinus Disease, Alpha 1 Antitripsin deficiency, Anxiety masquerading as Airways dz,  ABPA,  Allergy(esp in young), Aspiration (esp in elderly), Adverse effects of meds,  Active smoking or vaping, A bunch of PE's (a small clot burden can't cause this syndrome unless there is already severe underlying pulm or vascular dz with poor reserve) plus two Bs  = Bronchiectasis and Beta blocker use..and one C= CHF ? ? ?Adherence is always the initial "prime suspect" and is a multilayered concern that requires a "trust but verify" approach in every patient - starting with knowing how to use medications, especially inhalers, correctly, keeping up with refills and understanding the fundamental difference between maintenance and prns vs those medications only taken for a very short course and then stopped and not refilled.  ?- see hfa teaching ? ?? Acid (or non-acid) GERD > always difficult to exclude as up to 75% of pts in some series report no assoc GI/ Heartburn symptoms> rec continue max (24h)  acid suppression and diet restrictions/ reviewed    ? ?? Allergy /asthma > high dose ics x one week then just singulair and prn saba and see if really needs the breztri ? ?? Anxiety > usually at the bottom of this list of usual suspects but note she's  already on psychotropics and may interfere with adherence and also  interpretation of response or lack thereof to symptom management which can be quite subjective > f/u Dr Evelene Croon  ? ?? Bronchiectasis > HRCT next step ? ?    ?  ? ?Each maintenance medication was reviewed in detail including emphasizing most importantly the difference between maintenance and prns and under what circumstances the prns are to be triggered using an action plan format where appropriate. ? ?Total time for H and P, chart review, counseling, reviewing hfa device(s) and generating customized AVS unique to this office visit / same day charting  > 30 min  ?     ?

## 2022-02-26 NOTE — Patient Instructions (Signed)
Continue breztri (like high octane fuel) Take 2 puffs first thing in am and then another 2 puffs about 12 hours later x one week then try off  ? ?Only use your albuterol as a rescue medication to be used if you can't catch your breath by resting or doing a relaxed purse lip breathing pattern.  ?- The less you use it, the better it will work when you need it. ?- Ok to use up to 2 puffs  every 4 hours if you must but call for immediate appointment if use goes up over your usual need ?- Don't leave home without it !!  (think of it like the spare tire for your car)  ? ?Ok to try albuterol 15 min before an activity (on alternating days like starter fluid )  that you know would usually make you short of breath and see if it makes any difference and if makes none then don't take albuterol after activity unless you can't catch your breath as this means it's the resting that helps, not the albuterol. ? ?We will call you to schedule a high resolution CT  ? ? ?Please schedule a follow up office visit in 6 weeks, call sooner if needed  ?    ?

## 2022-02-26 NOTE — Progress Notes (Signed)
? ?[name], female    DOB: [birth year]  MRN: [id number] ? ? ?Brief patient profile:  ?27   yowf   never smoker  referred to pulmonary clinic in [facility]  01/23/2022 by Dr Elizabeth Sauer for post covid doe and chest tightness. ? ?Eval for ? EIA vs vcd and ? ILD by Meredeth Ide  but resolved p a year of singulair /saba 2019 and stopped them but since then able to do elipitical x 30 min s sob  but then covid19 dx   p at least 2 vax presented with cp > ER 12/25/2020   rx zofran  then 02/05/21 [facility] cxr Patchy opacities  noted at the peripheral right upper lobe. Additional more subtle hazy opacities are also seen at the right lung base rx ? But cough / chest tightness never better ? ? ?History of Present Illness  ?01/23/2022  Pulmonary/ 1st office eval/ Sherene Sires / [employer]  Office  ?Chief Complaint  ?Patient presents with  ? Consult  ?Dyspnea:  assoc with persistent daily chest pressure ever since covid / now bike riding s flare with ex ?Cough: still clearing throat a lot, no excess mucus. ?Sleep: on side flat bed on 2 pillows minimal chest tightness and never wakes up with it ?SABA use: twice daily doesn't really help much but using less since started [place] ?Rec ?Plan A = Automatic = Always=    Breztri Take 2 puffs first thing in am and then another 2 puffs about 12 hours later. And also continue singulair.  ?Work on inhaler technique:  ?Pantoprazole (protonix) 40 mg   Take  30-60 min before first meal of the day and Pepcid (famotidine)  20 mg after supper until return to office - this is the best way to tell whether stomach acid is contributing to your problem.   ?Plan B = Backup (to supplement plan A, not to replace it) ?Only use your albuterol inhaler as a rescue medication ?Ok to try albuterol 15 min before an activity (on alternating days)  that you know would usually make you short of breath  ?GERD diet reviewed, bed blocks rec  ?Cxr: Reticulonodular opacities of the lungs, compatible with sequela atypical  infection. ? ? ?  ? ?02/26/2022  f/u ov/[facility]/ [facility] re: sob better, cough same  maint on trial of gerd rx and breztri with 50% effective hfa/singulair  ?Chief Complaint  ?Patient presents with  ? Follow-up  ?  Breathing has improved since last OV. C/o sob with exertion, dry cough at times prod with clear to yellow sputum and chest tightness.   ?Dyspnea:  recumbent bike x 30 min x 2 months slow progress  ?Cough: sporadic but not noct and overall better on gerd rx ?Sleeping: 2 pillows/ flat bed  ?SABA use: rarely  ?02: none  ?Cp much better, anterior,not pleuritic or ex related  ? ?No obvious patterns day to day or daytime variability or assoc   mucus plugs or   cp or  subjective wheeze or overt sinus or hb symptoms.  ? ?Sleeping  without nocturnal  or early am exacerbation  of respiratory  c/o's or need for noct saba. Also denies any obvious fluctuation of symptoms with weather or environmental changes or other aggravating or alleviating factors except as outlined above  ? ?No unusual exposure hx or h/o childhood pna/ asthma or knowledge of premature birth. ? ?Current Allergies, Complete Past Medical History, Past Surgical History, Family History, and Social History were reviewed in [employer]  electronic medical record. ? ?ROS  The following are not active complaints unless bolded ?Hoarseness, sore throat, dysphagia, dental problems, itching, sneezing,  nasal congestion or discharge of excess mucus or purulent secretions, ear ache,   fever, chills, sweats, unintended wt loss or wt gain, classically pleuritic or exertional cp,  orthopnea pnd or arm/hand swelling  or leg swelling, presyncope, palpitations, abdominal pain, anorexia, nausea, vomiting, diarrhea  or change in bowel habits or change in bladder habits, change in stools or change in urine, dysuria, hematuria,  rash, arthralgias, visual complaints, headache, numbness, weakness or ataxia or problems with walking or coordination,  change in mood  or  memory. ?      ? ?Current Meds  ?Medication Sig  ? albuterol (VENTOLIN HFA) 108 (90 Base) MCG/ACT inhaler USE 2 INHALATIONS EVERY 6 HOURS AS NEEDED FOR WHEEZING OR SHORTNESS OF BREATH  ? benzonatate (TESSALON PERLES) 100 MG capsule Take 1 capsule (100 mg total) by mouth 3 (three) times daily as needed for cough.  ? Budeson-Glycopyrrol-Formoterol (BREZTRI AEROSPHERE) 160-9-4.8 MCG/ACT AERO Inhale 2 puffs into the lungs in the morning and at bedtime.  ? carbamazepine (TEGRETOL) 200 MG tablet 600 mg daily. Dr Evelene Croon  ? carbamide peroxide (DEBROX) 6.5 % OTIC solution Place 5 drops into both ears 2 (two) times daily.  ? cetirizine-pseudoephedrine (ZYRTEC-D) 5-120 MG tablet Take 1 tablet by mouth 2 (two) times daily as needed for allergies.  ? famotidine (PEPCID) 20 MG tablet Take 1 tablet (20 mg total) by mouth daily.  ? fluticasone (FLONASE) 50 MCG/ACT nasal spray Place 2 sprays into both nostrils daily.  ? gabapentin (NEURONTIN) 100 MG capsule Take 2 capsules (200 mg total) by mouth at bedtime.  ? lamoTRIgine (LAMICTAL) 200 MG tablet Take 200 mg by mouth daily. Evelene Croon  ? levothyroxine (SYNTHROID) 100 MCG tablet Take 1 tablet (100 mcg total) by mouth daily before breakfast.  ? LORazepam (ATIVAN) 1 MG tablet Take 1 mg by mouth daily as needed. Dr Evelene Croon  ? montelukast (SINGULAIR) 10 MG tablet Take 1 tablet (10 mg total) by mouth at bedtime.  ? norethindrone-ethinyl estradiol (LOESTRIN) 1-20 MG-MCG tablet Take 1 tablet by mouth daily.  ? pantoprazole (PROTONIX) 40 MG tablet Take 1 tablet (40 mg total) by mouth daily.  ?     ?  ? ?Past Medical History:  ?Diagnosis Date  ? Anxiety   ? Arthritis   ? spondylosis  ? Asthma   ? Bipolar 2 disorder (HCC)   ? Bipolar disease, chronic (HCC)   ? Depression   ? Thyroid disease   ?  ? ?  ? ? ?Objective:  ?  ? ?Wt Readings from Last 3 Encounters:  ?02/26/22 142 lb 3.2 oz (64.5 kg)  ?01/28/22 144 lb (65.3 kg)  ?01/23/22 142 lb 9.6 oz (64.7 kg)  ?  ? ? ?Vital signs reviewed  02/26/2022  -  Note at rest 02 sats  96% on RA  ? ?General appearance:    amb pleasant wf nwf    ? ? HEENT : pt wearing mask not removed for exam due to covid -19 concerns.  ? ? ?NECK :  without JVD/Nodes/TM/ nl carotid upstrokes bilaterally ? ? ?LUNGS: no acc muscle use,  Nl contour chest which is clear to A and P bilaterally without cough on insp or exp maneuvers ? ? ?CV:  RRR  no s3 or murmur or increase in P2, and no edema  ? ?ABD:  soft and nontender with nl inspiratory excursion  in the supine position. No bruits or organomegaly appreciated, bowel sounds nl ? ?MS:  Nl gait/ ext warm without deformities, calf tenderness, cyanosis or clubbing ?No obvious joint restrictions  ? ?SKIN: warm and dry without lesions   ? ?NEURO:  alert, approp, nl sensorium with  no motor or cerebellar deficits apparent.  ?  ? ?  ? ?  ?  ?Assessment  ? ?  ?    ?

## 2022-03-03 ENCOUNTER — Encounter: Payer: Self-pay | Admitting: Family Medicine

## 2022-03-04 ENCOUNTER — Other Ambulatory Visit: Payer: BC Managed Care – PPO

## 2022-03-04 DIAGNOSIS — E785 Hyperlipidemia, unspecified: Secondary | ICD-10-CM | POA: Diagnosis not present

## 2022-03-05 ENCOUNTER — Other Ambulatory Visit: Payer: Self-pay

## 2022-03-05 DIAGNOSIS — E785 Hyperlipidemia, unspecified: Secondary | ICD-10-CM

## 2022-03-05 LAB — LIPID PANEL WITH LDL/HDL RATIO
Cholesterol, Total: 298 mg/dL — ABNORMAL HIGH (ref 100–199)
HDL: 98 mg/dL (ref >39)
LDL Chol Calc (NIH): 187 mg/dL — ABNORMAL HIGH (ref 0–99)
LDL/HDL Ratio: 1.9 ratio (ref 0.0–3.2)
Triglycerides: 84 mg/dL (ref 0–149)
VLDL Cholesterol Cal: 13 mg/dL (ref 5–40)

## 2022-03-05 MED ORDER — ATORVASTATIN CALCIUM 10 MG PO TABS: 10.0000 mg | ORAL_TABLET | Freq: Every day | ORAL | 1 refills | Status: DC

## 2022-03-05 NOTE — Progress Notes (Signed)
Sent in statin to [facility] ?

## 2022-03-06 ENCOUNTER — Ambulatory Visit: Payer: BC Managed Care – PPO | Admitting: Family Medicine

## 2022-03-12 ENCOUNTER — Ambulatory Visit
Admission: RE | Admit: 2022-03-12 | Discharge: 2022-03-12 | Disposition: A | Discharge status: Home / Self Care | Payer: BC Managed Care – PPO | Source: Ambulatory Visit | Attending: Internal Medicine | Admitting: Internal Medicine

## 2022-03-12 ENCOUNTER — Other Ambulatory Visit: Payer: Self-pay

## 2022-03-12 DIAGNOSIS — R0609 Other forms of dyspnea: Secondary | ICD-10-CM | POA: Diagnosis not present

## 2022-03-12 DIAGNOSIS — J849 Interstitial pulmonary disease, unspecified: Secondary | ICD-10-CM | POA: Diagnosis not present

## 2022-03-12 IMAGING — CT CT CHEST HIGH RESOLUTION
2 of 7 series · 14 of 36 positions shown, 17 images · non-contrast
Comparison: CT chest angiogram, [DATE]

CLINICAL DATA: Interstitial lung disease, COVID [DATE], chest
pressure for 1 year



[Series 4: thorax 2.00 cor · coronal · 0.61mm/px · 3 of 142 slices shown]
[im 29/142  lung]
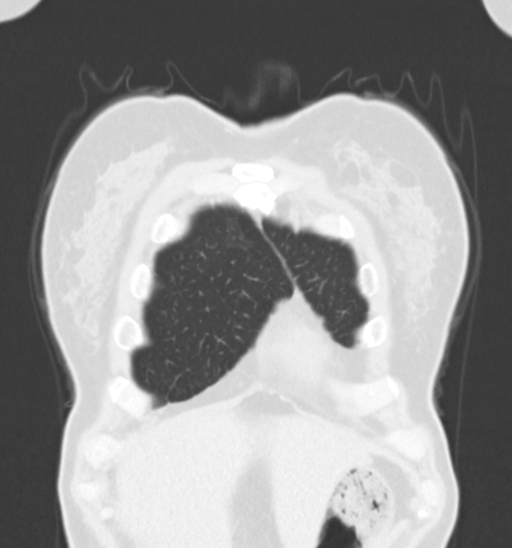
[im 57/142  lung]
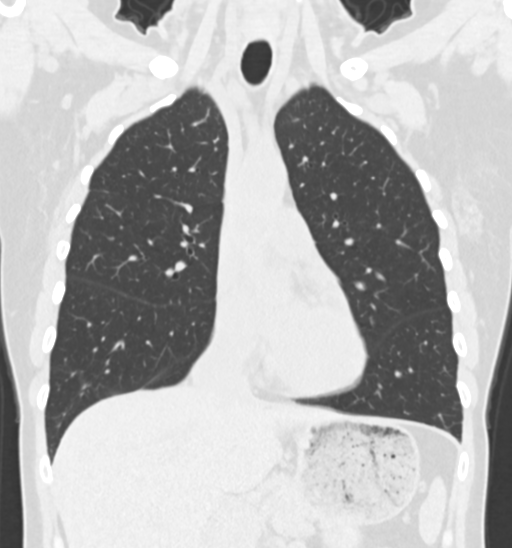
[im 85/142  lung]
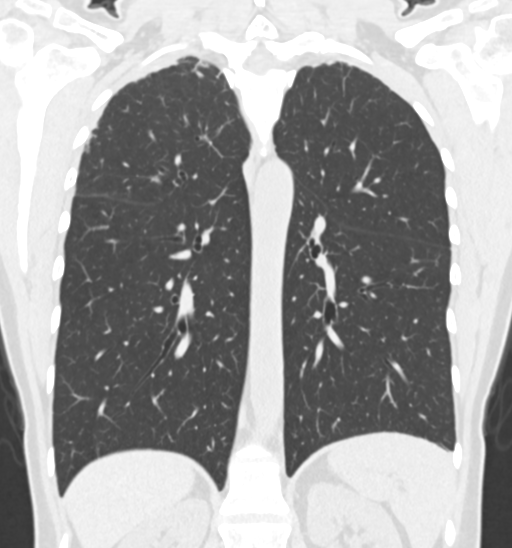

[Series 10: high res (id) thorax 1.00 ax · axial · 0.56mm/px · z∈[-1191,-907]mm · 11 of 336 slices shown, 14 images]
[im 26/336  mediastinal]
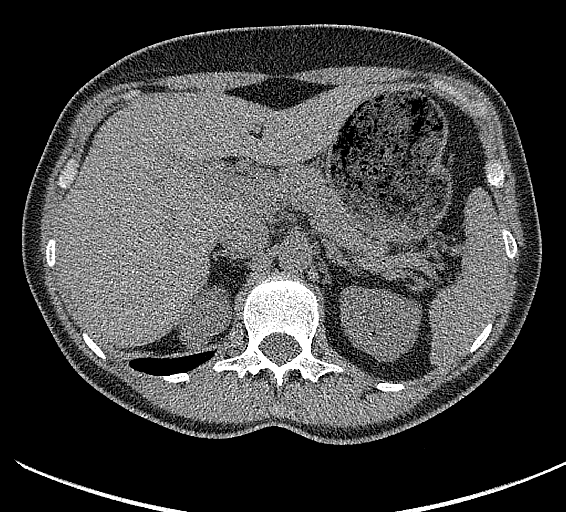
[im 26/336  lung]
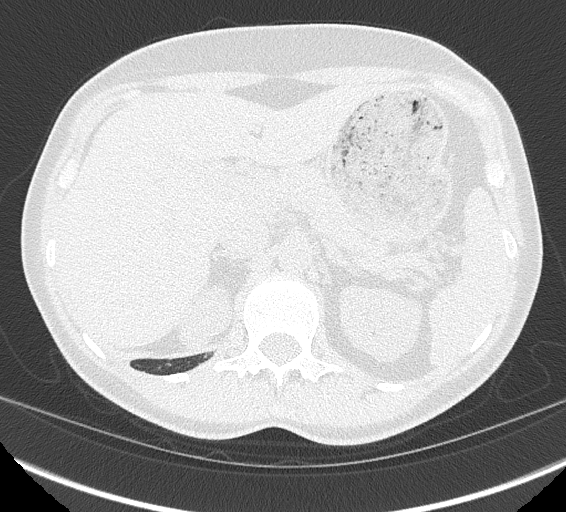
[im 52/336  lung]
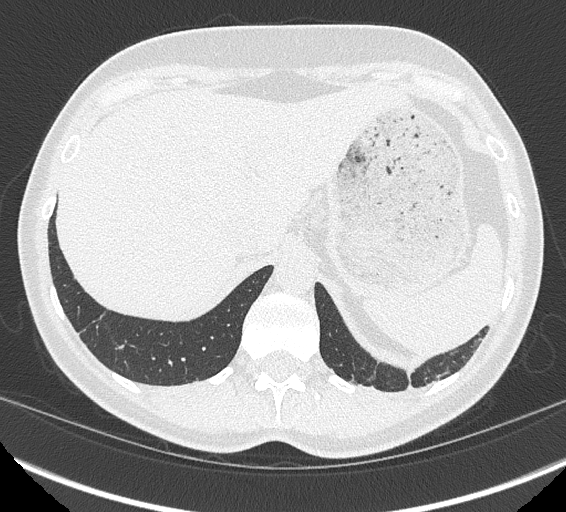
[im 78/336  lung]
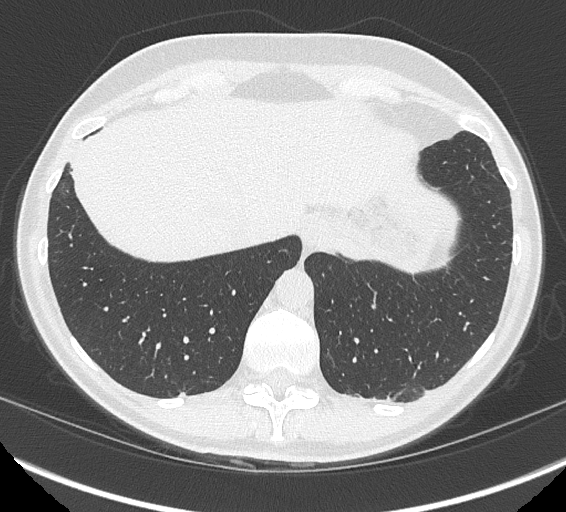
[im 104/336  lung]
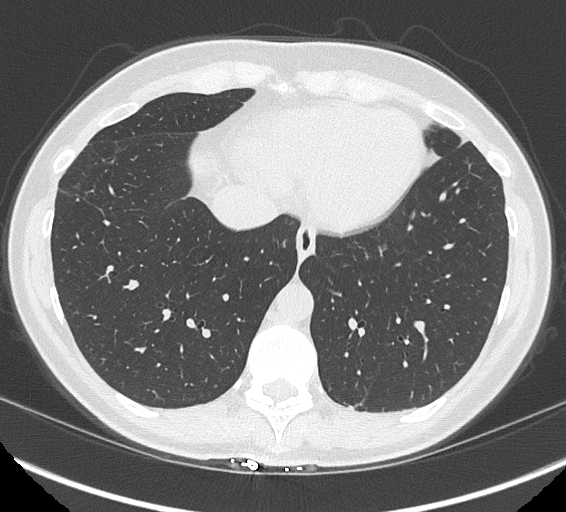
[im 129/336  mediastinal]
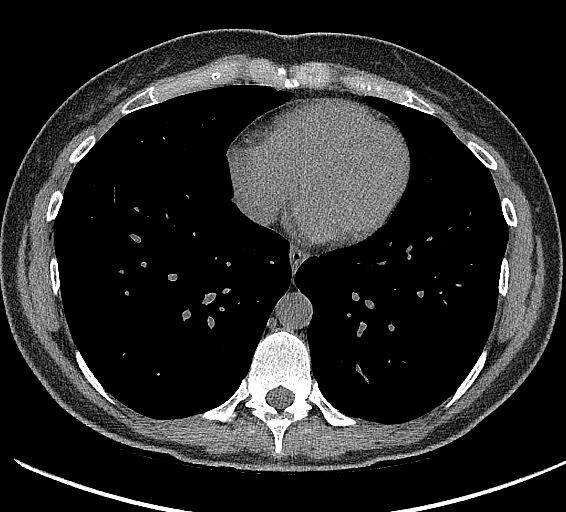
[im 129/336  lung]
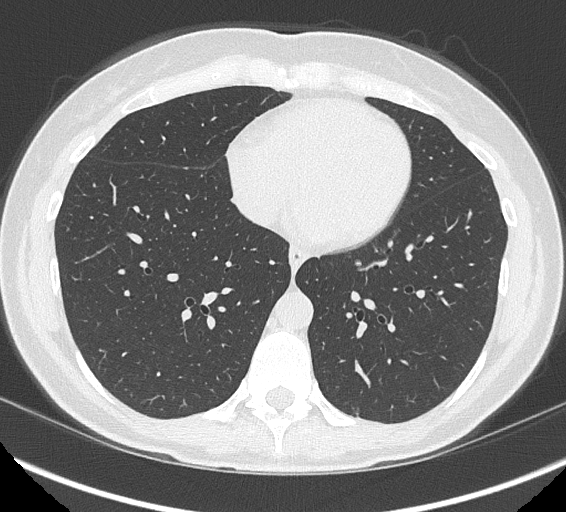
[im 181/336  lung]
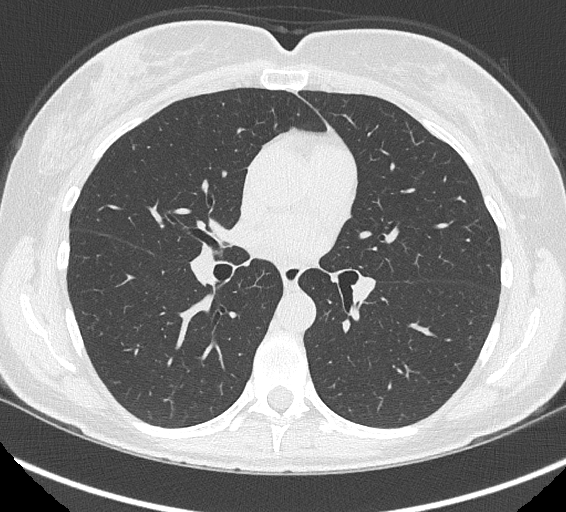
[im 207/336  lung]
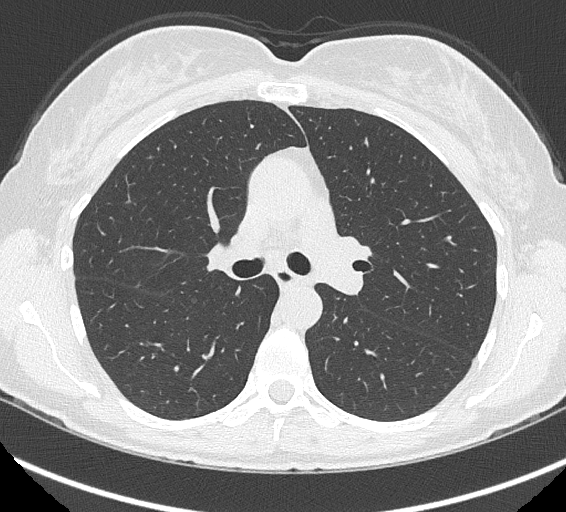
[im 232/336  lung]
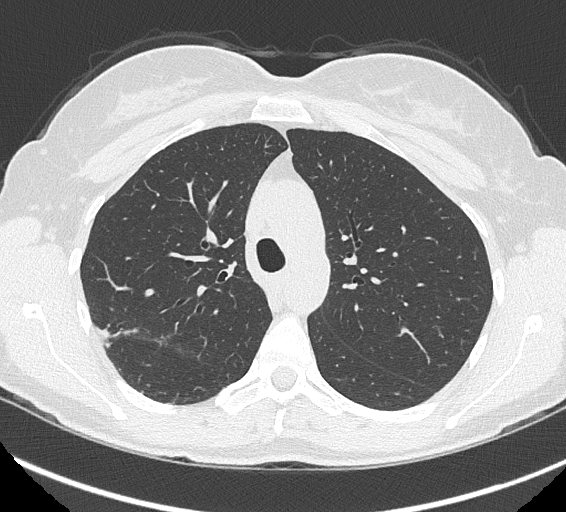
[im 258/336  mediastinal]
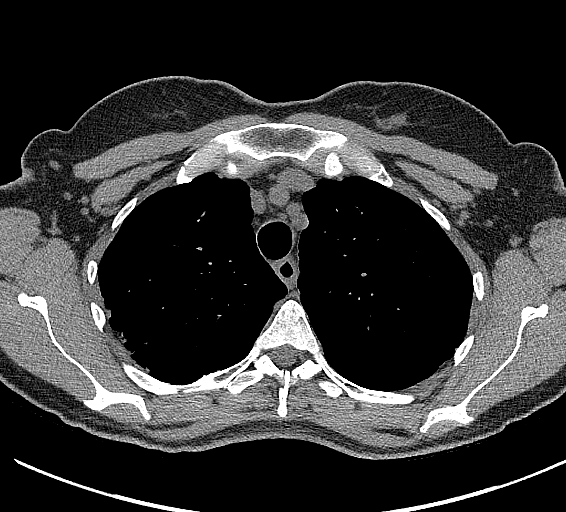
[im 258/336  lung]
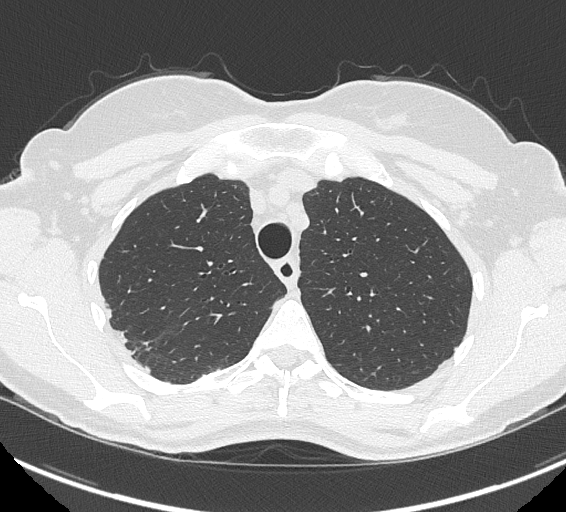
[im 284/336  lung]
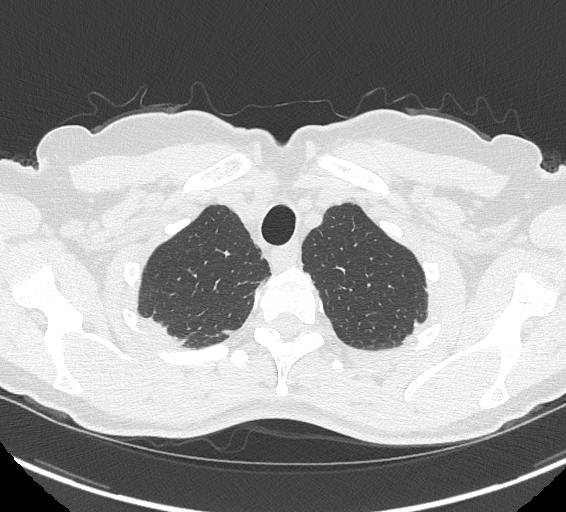
[im 310/336  lung]
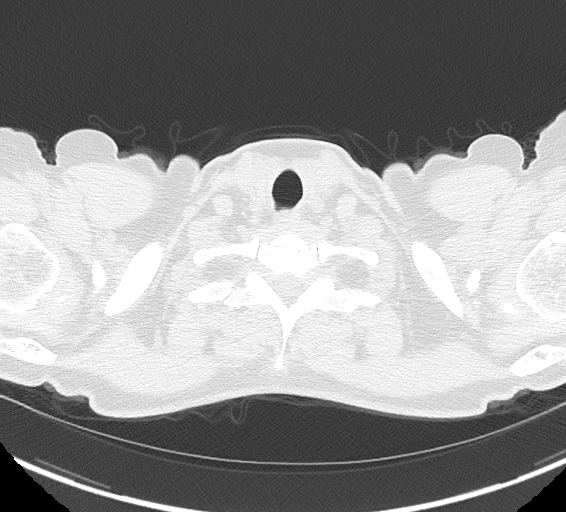

[14 of 36 positions shown; findings below may reference images not displayed]

FINDINGS: Cardiovascular: No significant vascular findings. Normal heart size.
No pericardial effusion.

Mediastinum/Nodes: No enlarged mediastinal, hilar, or axillary lymph
nodes. Thyroid gland, trachea, and esophagus demonstrate no
significant findings.

Lungs/Pleura: Biapical pleuroparenchymal scarring. Mild, bland
appearing bandlike scarring of the dependent left lower lobe (series
10, image 274), dependent right lower lobe (series 10, image 297),
and anterior right lower lobe (series 10, image 246). No significant
air trapping on expiratory phase imaging. No pleural effusion or
pneumothorax.

Upper Abdomen: No acute abnormality.

Musculoskeletal: No chest wall abnormality. No suspicious osseous
lesions identified.
IMPRESSION: 1. No evidence of fibrotic interstitial lung disease.
2. Mild, bland appearing scarring of the bilateral lower lobes and
biapical pleuroparenchymal scarring, findings consistent with
sequelae of prior infection or inflammation and unchanged compared
to examination dated [DATE].

## 2022-03-13 ENCOUNTER — Other Ambulatory Visit: Payer: BC Managed Care – PPO

## 2022-03-16 ENCOUNTER — Telehealth: Payer: Self-pay | Admitting: Internal Medicine

## 2022-03-16 NOTE — Progress Notes (Signed)
LMTCB

## 2022-03-17 NOTE — Telephone Encounter (Signed)
Patient is aware of below results and voiced her understanding.  ?She will keep scheduled visit for 04/23/2022. Offered sooner appt but this did not work with patient's schedule, as she will be out of town.  ?Nothing further needed.  ? ? ? Tanda Rockers, MD  ?03/15/2022  1:13 PM EDT   ?  ?Call patient :  Study is c/w minimal residual scarring from prior infection (like covid) and no significant lung damag (no pulmonary fibrosis or other worrisome findings) ?  ?Be sure patient has/keeps f/u ov so we can go over all the details of this study and get a plan together moving forward - ok to move up f/u if not feeling better and wants to be seen sooner ?  ?Copy to pcp   ? ?

## 2022-03-23 DIAGNOSIS — Z6823 Body mass index (BMI) 23.0-23.9, adult: Secondary | ICD-10-CM | POA: Diagnosis not present

## 2022-03-23 DIAGNOSIS — G252 Other specified forms of tremor: Secondary | ICD-10-CM | POA: Diagnosis not present

## 2022-03-23 DIAGNOSIS — F3181 Bipolar II disorder: Secondary | ICD-10-CM | POA: Diagnosis not present

## 2022-04-17 ENCOUNTER — Ambulatory Visit: Payer: BC Managed Care – PPO | Admitting: Adult Health

## 2022-04-19 ENCOUNTER — Encounter: Payer: Self-pay | Admitting: Internal Medicine

## 2022-04-23 ENCOUNTER — Ambulatory Visit (INDEPENDENT_AMBULATORY_CARE_PROVIDER_SITE_OTHER): Payer: BC Managed Care – PPO | Admitting: Internal Medicine

## 2022-04-23 ENCOUNTER — Encounter: Payer: Self-pay | Admitting: Internal Medicine

## 2022-04-23 VITALS — BP 110/80 | HR 83 | Ht 65.0 in | Wt 141.0 lb

## 2022-04-23 DIAGNOSIS — J849 Interstitial pulmonary disease, unspecified: Secondary | ICD-10-CM | POA: Diagnosis not present

## 2022-04-23 DIAGNOSIS — R0609 Other forms of dyspnea: Secondary | ICD-10-CM | POA: Diagnosis not present

## 2022-04-23 NOTE — Progress Notes (Signed)
? ?Stephanie Mcguire, female    DOB: 1971/08/20  MRN: [id number] ? ? ?Brief patient profile:  ?49   yowf   never smoker  referred to pulmonary clinic in [facility]  01/23/2022 by Dr Otilio Miu for post covid doe and chest tightness. ? ?Eval for ? EIA vs vcd and ? ILD by Raul Del  but resolved p a year of singulair /saba 2019 and stopped them but since then able to do elipitical x 30 min s sob  but then covid19 dx   p at least 2 vax presented with cp > ER 12/25/2020   rx zofran  then 02/05/21 [facility] cxr Patchy opacities  noted at the peripheral right upper lobe. Additional more subtle hazy opacities are also seen at the right lung base rx ? But cough / chest tightness never better ? ? ?History of Present Illness  ?01/23/2022  Pulmonary/ 1st office eval/ Melvyn Novas / [employer]  Office doe x 2017  ?Chief Complaint  ?Patient presents with  ? Consult  ?Dyspnea:  assoc with persistent daily chest pressure ever since covid / now bike riding s flare with ex ?Cough: still clearing throat a lot, no excess mucus. ?Sleep: on side flat bed on 2 pillows minimal chest tightness and never wakes up with it ?SABA use: twice daily doesn't really help much but using less since started [place] ?Rec ?Plan A = Automatic = Always=    Breztri Take 2 puffs first thing in am and then another 2 puffs about 12 hours later. And also continue singulair.  ?Work on inhaler technique:  ?Pantoprazole (protonix) 40 mg   Take  30-60 min before first meal of the day and Pepcid (famotidine)  20 mg after supper until return to office - this is the best way to tell whether stomach acid is contributing to your problem.   ?Plan B = Backup (to supplement plan A, not to replace it) ?Only use your albuterol inhaler as a rescue medication ?Ok to try albuterol 15 min before an activity (on alternating days)  that you know would usually make you short of breath  ?GERD diet reviewed, bed blocks rec  ?Cxr: Reticulonodular opacities of the lungs, compatible with sequela  atypical infection. ? ? ?  ? ?02/26/2022  f/u ov/Andray Assefa/ [facility] re: sob better, cough same  maint on trial of gerd rx and breztri with 50% effective hfa/singulair  ?Chief Complaint  ?Patient presents with  ? Follow-up  ?  Breathing has improved since last OV. C/o sob with exertion, dry cough at times prod with clear to yellow sputum and chest tightness.   ?Dyspnea:  recumbent bike x 30 min x 2 months slow progress  ?Cough: sporadic but not noct and overall better on gerd rx ?Sleeping: 2 pillows/ flat bed  ?SABA use: rarely  ?02: none  ?Cp much better, anterior,not pleuritic or ex related  ?Rec ?Continue breztri (like high octane fuel) Take 2 puffs first thing in am and then another 2 puffs about 12 hours later x one week then try off  ?Only use your albuterol as a rescue medication ?Ok to try albuterol 15 min before an activity (on alternating days like starter fluid )  that you know would usually make you short of breath.  ?High resolution CT 03/12/22   HRCT:  No ILD  min scarring  ? ? ?04/23/2022  f/u ov/Emmy Keng re: sob/cough maint on singulair  /gerd rx  cough better but breathing is not  ?Chief Complaint  ?Patient  presents with  ? Follow-up  ?  CT f/u  ?Dyspnea:  extremes of heat or cold / doing fine recumbent bike  ?Cough: better  ?Sleeping: flat bed 2 pillows  ?SABA use: used x 3  ?02: none  ?Covid status:  vax x 4  ?Still some chest pressure not better on gerd rx  ? ? ?No obvious day to day or daytime variability or assoc excess/ purulent sputum or mucus plugs or hemoptysis or cp  subjective wheeze or overt sinus or hb symptoms.  ? ?Sleeping  without nocturnal  or early am exacerbation  of respiratory  c/o's or need for noct saba. Also denies any obvious fluctuation of symptoms with weather or environmental changes or other aggravating or alleviating factors except as outlined above  ? ?No unusual exposure hx or h/o childhood pna/ asthma or knowledge of premature birth. ? ?Current Allergies, Complete Past  Medical History, Past Surgical History, Family History, and Social History were reviewed in [employer] record. ? ?ROS  The following are not active complaints unless bolded ?Hoarseness, sore throat, dysphagia/globus sensation dental problems, itching, sneezing,  nasal congestion or discharge of excess mucus or purulent secretions, ear ache,   fever, chills, sweats, unintended wt loss or wt gain, classically pleuritic or exertional cp,  orthopnea pnd or arm/hand swelling  or leg swelling, presyncope, palpitations, abdominal pain, anorexia, nausea, vomiting, diarrhea  or change in bowel habits or change in bladder habits, change in stools or change in urine, dysuria, hematuria,  rash, arthralgias, visual complaints, headache, numbness, weakness or ataxia or problems with walking or coordination,  change in mood or  memory. ?      ? ?Current Meds  ?Medication Sig  ? albuterol (VENTOLIN HFA) 108 (90 Base) MCG/ACT inhaler USE 2 INHALATIONS EVERY 6 HOURS AS NEEDED FOR WHEEZING OR SHORTNESS OF BREATH  ? atorvastatin (LIPITOR) 10 MG tablet Take 1 tablet (10 mg total) by mouth daily.  ? benzonatate (TESSALON PERLES) 100 MG capsule Take 1 capsule (100 mg total) by mouth 3 (three) times daily as needed for cough.  ? carbamazepine (TEGRETOL) 200 MG tablet 600 mg daily. Dr Toy Care  ? carbamide peroxide (DEBROX) 6.5 % OTIC solution Place 5 drops into both ears 2 (two) times daily.  ? cetirizine-pseudoephedrine (ZYRTEC-D) 5-120 MG tablet Take 1 tablet by mouth 2 (two) times daily as needed for allergies.  ? famotidine (PEPCID) 20 MG tablet Take 1 tablet (20 mg total) by mouth daily.  ? fluticasone (FLONASE) 50 MCG/ACT nasal spray Place 2 sprays into both nostrils daily.  ? gabapentin (NEURONTIN) 100 MG capsule Take 2 capsules (200 mg total) by mouth at bedtime.  ? lamoTRIgine (LAMICTAL) 200 MG tablet Take 200 mg by mouth daily. Toy Care  ? levothyroxine (SYNTHROID) 100 MCG tablet Take 1 tablet (100 mcg total) by  mouth daily before breakfast.  ? LORazepam (ATIVAN) 1 MG tablet Take 1 mg by mouth daily as needed. Dr Toy Care  ? montelukast (SINGULAIR) 10 MG tablet Take 1 tablet (10 mg total) by mouth at bedtime.  ? norethindrone-ethinyl estradiol (LOESTRIN) 1-20 MG-MCG tablet Take 1 tablet by mouth daily.  ? pantoprazole (PROTONIX) 40 MG tablet Take 1 tablet (40 mg total) by mouth daily.  ?    ? ?  ? ?Past Medical History:  ?Diagnosis Date  ? Anxiety   ? Arthritis   ? spondylosis  ? Asthma   ? Bipolar 2 disorder ([place])   ? Bipolar disease, chronic ([place])   ?  Depression   ? Thyroid disease   ?  ? ?  ? ? ?Objective:  ?  ? ?04/23/2022       141  ?02/26/22 142 lb 3.2 oz (64.5 kg)  ?01/28/22 144 lb (65.3 kg)  ?01/23/22 142 lb 9.6 oz (64.7 kg)  ?  ? Vital signs reviewed  04/23/2022  - Note at rest 02 sats  98% on RA  ? ?General appearance:   amb wf nad  ? ? HEENT : nl exam / no throat clearing   ? ? ?NECK :  without JVD/Nodes/TM/ nl carotid upstrokes bilaterally ? ? ?LUNGS: no acc muscle use,  Nl contour chest which is clear to A and P bilaterally without cough on insp or exp maneuvers ? ? ?CV:  RRR  no s3 or murmur or increase in P2, and no edema  ? ?ABD:  soft and nontender with nl inspiratory excursion in the supine position. No bruits or organomegaly appreciated, bowel sounds nl ? ?MS:  Nl gait/ ext warm without deformities, calf tenderness, cyanosis or clubbing ?No obvious joint restrictions  ? ?SKIN: warm and dry without lesions   ? ?NEURO:  alert, approp, nl sensorium with  no motor or cerebellar deficits apparent.   ?  ? ?  ? ?I personally reviewed images and agree with radiology impression as follows:  ? Chest  HRCT  03/12/22  ?1. No evidence of fibrotic interstitial lung disease. ?2. Mild, bland appearing scarring of the bilateral lower lobes and ?biapical pleuroparenchymal scarring, findings consistent with ?sequelae of prior infection or inflammation and unchanged compared ?to examination dated 12/25/2020. ? ?  ?  ?Assessment   ? ?  ?    ?

## 2022-04-23 NOTE — Patient Instructions (Addendum)
To get the most out of exercise, you need to be continuously aware that you are short of breath, but never out of breath, for at least 30 minutes daily. As you improve, it will actually be easier for you to do the same amount of exercise  in  30 minutes so always push to the level where you are short of breath.    ? ?We will schedule CPST in two weeks (no sooner) and call you the results ?  ?

## 2022-04-24 ENCOUNTER — Encounter: Payer: Self-pay | Admitting: Internal Medicine

## 2022-04-24 NOTE — Assessment & Plan Note (Addendum)
Onset with covid 12/25/20 assoc with atypical cp/ chest tightness not resolved on breztri ?-  01/23/22   Walked on RA  x  3  lap(s) =  approx 525  ft  @ fast pace, stopped due to end of study with lowest 02 sats 99%  ? - 01/23/2022 max gerd rx  > improved 02/26/2022  ?- 02/26/2022  After extensive coaching inhaler device,  effectiveness =    90%> continue breztri x one week at 2 bid and then stop to see if breathing or changes in sob or cough  > not better  ?- 03/12/22   HRCT:  No ILD - min peripheral  scarring  ? ?EIA and ILD have been excluded on basis of response to breztri and HRCT respectively and the cough is better on gerd rx but not the doe so next step is a trial of sub max ex x 2 weeks then return for cpst to sort out her symptoms ? ?Discussed in detail all the  indications, usual  risks and alternatives  relative to the benefits with patient who agrees to proceed with w/u as outlined.  ? ?    ?  ? ?Each maintenance medication was reviewed in detail including emphasizing most importantly the difference between maintenance and prns and under what circumstances the prns are to be triggered using an action plan format where appropriate. ? ?Total time for H and P, chart and extensive imaging review, counseling,   and generating customized AVS unique to this office visit / same day charting = 32 min for summary f/u ov ?       ?

## 2022-04-30 ENCOUNTER — Encounter: Payer: Self-pay | Admitting: Family Medicine

## 2022-05-08 ENCOUNTER — Telehealth: Payer: Self-pay | Admitting: Family Medicine

## 2022-05-08 ENCOUNTER — Other Ambulatory Visit: Payer: Self-pay

## 2022-05-08 DIAGNOSIS — E785 Hyperlipidemia, unspecified: Secondary | ICD-10-CM

## 2022-05-08 MED ORDER — ATORVASTATIN CALCIUM 10 MG PO TABS: 10.0000 mg | ORAL_TABLET | Freq: Every day | ORAL | 0 refills | Status: DC

## 2022-05-08 NOTE — Telephone Encounter (Signed)
Pt is calling to request order for cholestrol labs. Pt was advised that she would need an apt for medication refill. Pt declined and stated that an appt is not going to benefical for her since the labs would be unavailable for Dr.Jones to tell anything. Please advise with the pt.  ?

## 2022-05-08 NOTE — Telephone Encounter (Signed)
Patient sent message through my chart and Delice Bison is discussing this with patient. ?

## 2022-05-12 ENCOUNTER — Ambulatory Visit (INDEPENDENT_AMBULATORY_CARE_PROVIDER_SITE_OTHER): Payer: BC Managed Care – PPO | Admitting: Family Medicine

## 2022-05-12 ENCOUNTER — Encounter: Payer: Self-pay | Admitting: Family Medicine

## 2022-05-12 VITALS — BP 100/80 | HR 60 | Ht 65.0 in | Wt 145.0 lb

## 2022-05-12 DIAGNOSIS — R69 Illness, unspecified: Secondary | ICD-10-CM

## 2022-05-12 DIAGNOSIS — E785 Hyperlipidemia, unspecified: Secondary | ICD-10-CM

## 2022-05-12 MED ORDER — ATORVASTATIN CALCIUM 10 MG PO TABS: 10.0000 mg | ORAL_TABLET | Freq: Every day | ORAL | 1 refills | Status: DC

## 2022-05-12 NOTE — Progress Notes (Signed)
? ? ?Date:  05/12/2022  ? ?Name:  Stephanie Mcguire   DOB:  Apr 27, 1971   MRN:  [id number] ? ? ?Chief Complaint: Hyperlipidemia ? ?Hyperlipidemia ?This is a chronic problem. The current episode started more than 1 year ago. She has no history of chronic renal disease or diabetes. Pertinent negatives include no focal weakness, leg pain or myalgias. Current antihyperlipidemic treatment includes statins. The current treatment provides moderate improvement of lipids.  ? ?Lab Results  ?Component Value Date  ? NA 139 01/23/2022  ? K 4.1 01/23/2022  ? CO2 28 01/23/2022  ? GLUCOSE 97 01/23/2022  ? BUN 13 01/23/2022  ? CREATININE 0.88 01/23/2022  ? CALCIUM 9.2 01/23/2022  ? GFRNONAA >60 01/23/2022  ? ?Lab Results  ?Component Value Date  ? CHOL 298 (H) 03/04/2022  ? HDL 98 03/04/2022  ? LDLCALC 187 (H) 03/04/2022  ? TRIG 84 03/04/2022  ? ?Lab Results  ?Component Value Date  ? TSH 0.924 01/15/2022  ? ?No results found for: HGBA1C ?Lab Results  ?Component Value Date  ? WBC 4.9 01/23/2022  ? HGB 13.5 01/23/2022  ? HCT 41.2 01/23/2022  ? MCV 95.2 01/23/2022  ? PLT 303 01/23/2022  ? ?No results found for: ALT, AST, GGT, ALKPHOS, BILITOT ?No results found for: 25OHVITD2, 25OHVITD3, VD25OH  ? ?Review of Systems  ?Constitutional: Negative.  Negative for chills, fatigue, fever and unexpected weight change.  ?HENT:  Negative for congestion.   ?Respiratory:  Negative for cough.   ?Gastrointestinal:  Negative for abdominal pain.  ?Genitourinary:  Negative for dysuria and flank pain.  ?Musculoskeletal:  Negative for arthralgias and myalgias.  ?Skin:  Negative for rash.  ?Neurological:  Negative for dizziness, focal weakness and headaches.  ? ?Patient Active Problem List  ? Diagnosis Date Noted  ? Right carpal tunnel syndrome 12/17/2021  ? Right elbow pain 11/12/2021  ? Lumbar radiculopathy 10/17/2020  ? Patellofemoral arthritis of right knee 10/01/2020  ? Acute medial meniscal tear, right, initial encounter 08/02/2020  ? Interstitial lung  disease (HCC) 10/14/2015  ? DOE (dyspnea on exertion) 10/14/2015  ? Congenital hypothyroidism without goiter 10/14/2015  ? Abnormal chest CT 10/14/2015  ? Essential vulvodynia 12/10/2011  ? ? ?No Known Allergies ? ?Past Surgical History:  ?Procedure Laterality Date  ? BUNIONECTOMY    ? MANDIBLE SURGERY  2009  ? RECONSTRUCTION OF EYELID  1989 and 1990  ? ? ?Social History  ? ?Tobacco Use  ? Smoking status: Never  ? Smokeless tobacco: Never  ?Vaping Use  ? Vaping Use: Never used  ?Substance Use Topics  ? Alcohol use: Yes  ?  Alcohol/week: 2.0 standard drinks  ?  Types: 2 Glasses of wine per week  ? Drug use: No  ? ? ? ?Medication list has been reviewed and updated. ? ?Current Meds  ?Medication Sig  ? albuterol (VENTOLIN HFA) 108 (90 Base) MCG/ACT inhaler USE 2 INHALATIONS EVERY 6 HOURS AS NEEDED FOR WHEEZING OR SHORTNESS OF BREATH  ? atorvastatin (LIPITOR) 10 MG tablet Take 1 tablet (10 mg total) by mouth daily.  ? carbamazepine (TEGRETOL) 200 MG tablet 600 mg daily. Dr Evelene Croon  ? carbamide peroxide (DEBROX) 6.5 % OTIC solution Place 5 drops into both ears 2 (two) times daily.  ? cetirizine-pseudoephedrine (ZYRTEC-D) 5-120 MG tablet Take 1 tablet by mouth 2 (two) times daily as needed for allergies.  ? famotidine (PEPCID) 20 MG tablet Take 1 tablet (20 mg total) by mouth daily.  ? fluticasone (FLONASE) 50 MCG/ACT nasal spray  Place 2 sprays into both nostrils daily.  ? gabapentin (NEURONTIN) 100 MG capsule Take 2 capsules (200 mg total) by mouth at bedtime.  ? lamoTRIgine (LAMICTAL) 200 MG tablet Take 200 mg by mouth daily. Evelene Croon  ? levothyroxine (SYNTHROID) 100 MCG tablet Take 1 tablet (100 mcg total) by mouth daily before breakfast.  ? LORazepam (ATIVAN) 1 MG tablet Take 1 mg by mouth daily as needed. Dr Evelene Croon  ? montelukast (SINGULAIR) 10 MG tablet Take 1 tablet (10 mg total) by mouth at bedtime.  ? norethindrone-ethinyl estradiol (LOESTRIN) 1-20 MG-MCG tablet Take 1 tablet by mouth daily.  ? pantoprazole (PROTONIX) 40  MG tablet Take 1 tablet (40 mg total) by mouth daily.  ? ? ? ?  05/12/2022  ? 10:20 AM 01/15/2022  ?  1:50 PM 06/17/2021  ? 11:02 AM 02/11/2021  ?  3:44 PM  ?GAD 7 : Generalized Anxiety Score  ?Nervous, Anxious, on Edge 3 0 0 3  ?Control/stop worrying 1 0 0 2  ?Worry too much - different things 3 0 0 2  ?Trouble relaxing 2 0 0 3  ?Restless 2 0 0 2  ?Easily annoyed or irritable 2 0 0 3  ?Afraid - awful might happen 1 0 0 1  ?Total GAD 7 Score 14 0 0 16  ?Anxiety Difficulty Somewhat difficult Not difficult at all  Somewhat difficult  ? ? ? ?  05/12/2022  ? 10:19 AM  ?Depression screen PHQ 2/9  ?Decreased Interest 1  ?Down, Depressed, Hopeless 2  ?PHQ - 2 Score 3  ?Altered sleeping 1  ?Tired, decreased energy 3  ?Change in appetite 1  ?Feeling bad or failure about yourself  0  ?Trouble concentrating 2  ?Moving slowly or fidgety/restless 1  ?Suicidal thoughts 0  ?PHQ-9 Score 11  ?Difficult doing work/chores Very difficult  ? ? ?BP Readings from Last 3 Encounters:  ?05/12/22 100/80  ?04/23/22 110/80  ?02/26/22 124/74  ? ? ?Physical Exam ?Vitals reviewed.  ?Constitutional:   ?   Appearance: She is well-developed.  ?HENT:  ?   Head: Normocephalic.  ?   Right Ear: Tympanic membrane and external ear normal.  ?   Left Ear: Tympanic membrane and external ear normal.  ?   Nose: Nose normal.  ?Eyes:  ?   General: Lids are everted, no foreign bodies appreciated. No scleral icterus.    ?   Left eye: No foreign body or hordeolum.  ?   Conjunctiva/sclera: Conjunctivae normal.  ?   Right eye: Right conjunctiva is not injected.  ?   Left eye: Left conjunctiva is not injected.  ?   Pupils: Pupils are equal, round, and reactive to light.  ?   Comments: nonicteric  ?Neck:  ?   Thyroid: No thyromegaly.  ?   Vascular: No JVD.  ?   Trachea: No tracheal deviation.  ?Cardiovascular:  ?   Rate and Rhythm: Normal rate and regular rhythm.  ?   Heart sounds: Normal heart sounds. No murmur heard. ?  No friction rub. No gallop.  ?Pulmonary:  ?   Effort:  Pulmonary effort is normal. No respiratory distress.  ?   Breath sounds: Normal breath sounds. No wheezing or rales.  ?Abdominal:  ?   General: Bowel sounds are normal.  ?   Palpations: Abdomen is soft. There is no hepatomegaly, splenomegaly or mass.  ?   Tenderness: There is no abdominal tenderness. There is no guarding or rebound.  ?Musculoskeletal:     ?  General: No tenderness. Normal range of motion.  ?   Cervical back: Normal range of motion and neck supple.  ?Lymphadenopathy:  ?   Cervical: No cervical adenopathy.  ?Skin: ?   General: Skin is warm.  ?   Findings: No rash.  ?Neurological:  ?   Mental Status: She is alert.  ?Psychiatric:     ?   Mood and Affect: Mood is not anxious or depressed.  ? ? ?Wt Readings from Last 3 Encounters:  ?05/12/22 145 lb (65.8 kg)  ?04/23/22 141 lb (64 kg)  ?02/26/22 142 lb 3.2 oz (64.5 kg)  ? ? ?BP 100/80   Pulse 60   Ht 5\' 5"  (1.651 m)   Wt 145 lb (65.8 kg)   BMI 24.13 kg/m?  ? ?Assessment and Plan: ? ?1. Hyperlipidemia, unspecified hyperlipidemia type ?Chronic.  Uncontrolled.  Have initiated atorvastatin 10 mg for effect and tolerability.  Will check lipid panel for current status of control. ?- atorvastatin (LIPITOR) 10 MG tablet; Take 1 tablet (10 mg total) by mouth daily.  Dispense: 90 tablet; Refill: 1 ?- Lipid Panel With LDL/HDL Ratio ? ?2. Taking medication for chronic disease ?Patient started on atorvastatin will check AST for effects on transaminases. ?- AST  ? ? ?

## 2022-05-12 NOTE — Patient Instructions (Signed)
GUIDELINES FOR  ?LOW-CHOLESTEROL, LOW-TRIGLYCERIDE DIETS  ?  ?FOODS TO USE  ? ?MEATS, FISH Choose lean meats (chicken, turkey, veal, and non-fatty cuts of beef with excess fat trimmed; one serving = 3 oz of cooked meat). Also, fresh or frozen fish, canned fish packed in water, and shellfish (lobster, crabs, shrimp, and oysters). Limit use to no more than one serving of one of these per week. Shellfish are high in cholesterol but low in saturated fat and should be used sparingly. Meats and fish should be broiled (pan or oven) or baked on a rack.  ?EGGS Egg substitutes and egg whites (use freely). Egg yolks (limit two per week).  ?FRUITS Eat three servings of fresh fruit per day (1 serving = ? cup). Be sure to have at least one citrus fruit daily. Frozen and canned fruit with no sugar or syrup added may be used.  ?VEGETABLES Most vegetables are not limited (see next page). One dark-green (string beans, escarole) or one deep yellow (squash) vegetable is recommended daily. Cauliflower, broccoli, and celery, as well as potato skins, are recommended for their fiber content. (Fiber is associated with cholesterol reduction) It is preferable to steam vegetables, but they may be boiled, strained, or braised with polyunsaturated vegetable oil (see below).  ?BEANS Dried peas or beans (1 serving = ? cup) may be used as a bread substitute.  ?NUTS Almonds, walnuts, and peanuts may be used sparingly  ?(1 serving = 1 Tablespoonful). Use pumpkin, sesame, or sunflower seeds.  ?BREADS, GRAINS One roll or one slice of whole grain or enriched bread may be used, or three soda crackers or four pieces of melba toast as a substitute. Spaghetti, rice or noodles (? cup) or ? large ear of corn may be used as a bread substitute. In preparing these foods do not use butter or shortening, use soft margarine. Also use egg and sugar substitutes.  Choose high fiber grains, such as oats and whole wheat.  ?CEREALS Use ? cup of hot cereal or ? cup of  cold cereal per day. Add a sugar substitute if desired, with 99% fat free or skim milk.  ?MILK PRODUCTS Always use 99% fat free or skim milk, dairy products such as low fat cheeses (farmer's uncreamed diet cottage), low-fat yogurt, and powdered skim milk.  ?FATS, OILS Use soft (not stick) margarine; vegetable oils that are high in polyunsaturated fats (such as safflower, sunflower, soybean, corn, and cottonseed). Always refrigerate meat drippings to harden the fat and remove it before preparing gravies  ?DESSERTS, SNACKS Limit to two servings per day; substitute each serving for a bread/cereal serving: ice milk, water sherbet (1/4 cup); unflavored gelatin or gelatin flavored with sugar substitute (1/3 cup); pudding prepared with skim milk (1/2 cup); egg white souffl?s; unbuttered popcorn (1 ? cups). Substitute carob for chocolate.  ?BEVERAGES Fresh fruit juices (limit 4 oz per day); black coffee, plain or herbal teas; soft drinks with sugar substitutes; club soda, preferably salt-free; cocoa made with skim milk or nonfat dried milk and water (sugar substitute added if desired); clear broth. Alcohol: limit two servings per day (see second page).  ?MISCELLANEOUS ? You may use the following freely: vinegar, spices, herbs, nonfat bouillon, mustard, Worcestershire sauce, soy sauce, flavoring essence.  ? ? ? ? ? ? ? ? ? ? ? ? ? ? ? ? ?GUIDELINES FOR  ?LOW-CHOLESTEROL, LOW TRIGLYCERIDE DIETS  ?  ?FOODS TO AVOID  ? ?MEATS, FISH Marbled beef, pork, bacon, sausage, and other pork products; fatty   fowl (duck, goose); skin and fat of turkey and chicken; processed meats; luncheon meats (salami, bologna); frankfurters and fast-food hamburgers (theyre loaded with fat); organ meats (kidneys, liver); canned fish packed in oil.  ?EGGS Limit egg yolks to two per week.   ?FRUITS Coconuts (rich in saturated fats).  ?VEGETABLES Avoid avocados. Starchy vegetables (potatoes, corn, lima beans, dried peas, beans) may be used only if  substitutes for a serving of bread or cereal. (Baked potato skin, however, is desirable for its fiber content.  ?BEANS Commercial baked beans with sugar and/or pork added.  ?NUTS Avoid nuts.  Limit peanuts and walnuts to one tablespoonful per day.  ?BREADS, GRAINS Any baked goods with shortening and/or sugar. Commercial mixes with dried eggs and whole milk. Avoid sweet rolls, doughnuts, breakfast pastries (Danish), and sweetened packaged cereals (the added sugar converts readily to triglycerides).  ?MILK PRODUCTS Whole milk and whole-milk packaged goods; cream; ice cream; whole-milk puddings, yogurt, or cheeses; nondairy cream substitutes.  ?FATS, OILS Butter, lard, animal fats, bacon drippings, gravies, cream sauces as well as palm and coconut oils. All these are high in saturated fats. Examine labels on cholesterol free products for hydrogenated fats. (These are oils that have been hardened into solids and in the process have become saturated.)  ?DESSERTS, SNACKS Fried snack foods like potato chips; chocolate; candies in general; jams, jellies, syrups; whole- milk puddings; ice cream and milk sherbets; hydrogenated peanut butter.  ?BEVERAGES Sugared fruit juices and soft drinks; cocoa made with whole milk and/or sugar. When using alcohol (1 oz liquor, 5 oz beer, or 2 ? oz dry table wine per serving), one serving must be substituted for one bread or cereal serving (limit, two servings of alcohol per day).  ? SPECIAL NOTES  ?  Remember that even non-limited foods should be used in moderation. ?While on a cholesterol-lowering diet, be sure to avoid animal fats and marbled meats. ?3. While on a triglyceride-lowering diet, be sure to avoid sweets and to control the amount of carbohydrates you eat (starchy foods such as flour, bread, potatoes).While on a tri-glyceride-lowering diet, be sure to avoid sweets ?Buy a good low-fat cookbook, such as the one published by the American Heart Association. ?Consult your physician  if you have any questions.  ? ? ? ? ? ? ? ? ? ? ? ? ? ?[facility] Low Glycemic Diet Plan ? ? ?Low Glycemic Foods (20-49) Moderate Glycemic Foods (50-69) High Glycemic Foods (70-100)  ?    ?Breakfast Creals Breakfast Cereals Breakfast Cereals  ?All Bran All-Bran Fruit'n Oats  ? Bran Buds Bran Chex  ? Cheerios Corn chex  ?  ?Fiber One Oatmeal (not instant)  ? Just Right Mini-Wheats  ? Corn Flakes Cream of Wheat  ?  ?Oat Bran Special K Swiss Muesli  ? Grape Nuts Grape Nut Flakes  ?  ?  Grits Nutri-Grain  ?  ?Fruits and fruit juice: Fruits Puffed Rice Puffed Wheat  ?  ?(Limit to 1-2 Servings per day) Banana (under-ride) Dates  ? Rice Chex Rice Krispies  ?  ?Apples Apricots (fresh/dried)  ? Figs Grapes  ? Shredded Wheat Team  ?  ?Blackberries Blueberries  ? Kiwi Mango  ? Total   ?  ?Cherries Cranberries  ? Oranges Raisins  ?   ?Peaches Pears  ?  Fruits  ?Plums Prunes  ? Fruit Juices Pineapple Watermelon  ?  ?Grapefruit Raspberries  ? Cranberry Juice Orange Juice  ? Banana (over-ripe)   ?  ?Strawberries Tangerines  ?    ?  Apple Juice Grapefruit Juice  ? Beans and Legumes Beverages  ?Tomato Juice   ? Boston-type baked beans Sodas, sweet tea, pineapple juice  ? Canned pinto, kidney, or navy beans   ?Beans and Legumes (fresh-cooked) Green peas Vegetables  ?Black-eyed peas Butter Beans  ?  Potato, baked, boiled, fried, mashed  ?Chick peas Lentils  ? Vegetables French fries  ?Green beans Lima beans  ? Beets Carrots  ? Canned or frozen corn  ?Kidney beans Navy beans  ? Sweet potato Yam  ? Parsnips  ?Pinto beans Snow peas  ? Corn on the cob Winter squash  ?    ?Non-starchy vegetables Grains Breads  ?Asparagus, avocado, broccoli, cabbage Cornmeal Rice, brown  ? Most breads (white and whole grain)  ?cauliflower, celery, cucumber, greens Rice, white Couscous  ? Bagels Bread sticks  ?  ?lettuce, mushrooms, peppers, tomatoes  Bread stuffing Kaiser roll  ?  ?okra, onions, spinach, summer squash Pasta Dinner rolls  ? Macaroni  Pizza, cheese  ?   ?Grains Ravioli, meat filled Spaghetti, white  ? Grains  ?Barley Bulgur  ?  Rice, instant Tapioca, with milk  ?  ?Rye Wild rice  ? Nuts   ? Cashews Macadamia  ? Candy and most cookies  ?Nuts and o

## 2022-05-13 ENCOUNTER — Other Ambulatory Visit: Payer: Self-pay

## 2022-05-13 ENCOUNTER — Encounter (HOSPITAL_COMMUNITY): Payer: BC Managed Care – PPO

## 2022-05-13 DIAGNOSIS — E785 Hyperlipidemia, unspecified: Secondary | ICD-10-CM

## 2022-05-13 LAB — LIPID PANEL WITH LDL/HDL RATIO
Cholesterol, Total: 240 mg/dL — ABNORMAL HIGH (ref 100–199)
HDL: 103 mg/dL (ref >39)
LDL Chol Calc (NIH): 128 mg/dL — ABNORMAL HIGH (ref 0–99)
LDL/HDL Ratio: 1.2 ratio (ref 0.0–3.2)
Triglycerides: 56 mg/dL (ref 0–149)
VLDL Cholesterol Cal: 9 mg/dL (ref 5–40)

## 2022-05-13 LAB — AST: AST: 19 IU/L (ref 0–40)

## 2022-05-13 MED ORDER — ATORVASTATIN CALCIUM 10 MG PO TABS: 10.0000 mg | ORAL_TABLET | Freq: Every day | ORAL | 0 refills | Status: DC

## 2022-05-13 MED ORDER — ATORVASTATIN CALCIUM 10 MG PO TABS: 10.0000 mg | ORAL_TABLET | Freq: Every day | ORAL | 1 refills | Status: DC

## 2022-05-13 NOTE — Telephone Encounter (Signed)
Pt called to request a bridge prescription for atorvastatin, she [facility] while she waits [facility]. ? ?Pt wants a call back to confirm from the clinic, please advise  ? ?Best contact: [phone] ? ?atorvastatin (LIPITOR) 10 MG tablet ?[facility] [address] - [place], [place] - [address] AT [facility] [address]  ?[address] [place] [address]  ?Phone: [phone] Fax: [phone]  ? ?

## 2022-05-19 ENCOUNTER — Ambulatory Visit (HOSPITAL_COMMUNITY): Payer: BC Managed Care – PPO | Attending: Internal Medicine

## 2022-05-19 DIAGNOSIS — J849 Interstitial pulmonary disease, unspecified: Secondary | ICD-10-CM

## 2022-05-20 NOTE — Progress Notes (Signed)
Spoke with pt and notified of results per Dr. Sherene Sires. She verbalized understanding. She declined appt but is asking if she can stop the singulair now. Please advise, thanks!

## 2022-05-21 ENCOUNTER — Other Ambulatory Visit: Payer: Self-pay | Admitting: Family Medicine

## 2022-05-21 DIAGNOSIS — E785 Hyperlipidemia, unspecified: Secondary | ICD-10-CM

## 2022-05-21 DIAGNOSIS — R1312 Dysphagia, oropharyngeal phase: Secondary | ICD-10-CM | POA: Diagnosis not present

## 2022-05-21 DIAGNOSIS — E039 Hypothyroidism, unspecified: Secondary | ICD-10-CM | POA: Diagnosis not present

## 2022-05-22 NOTE — Progress Notes (Signed)
Spoke with pt and notified of response per Dr. Wert. Pt verbalized understanding and denied any questions.   

## 2022-05-26 NOTE — Progress Notes (Unsigned)
[facility] [address] [place] [address] Phone: [phone] Subjective:   Stephanie Mcguire, am serving as a scribe for Dr. Antoine Primas.  I'm seeing this patient by the request  of:  Duanne Limerick, MD  CC: neck and back pain    DUK:[id number]  Stephanie Mcguire is a 51 y.o. female coming in with complaint of neck and back pain. Here for OMT. Neck no radiating pain. Limited ROM sometimes but mostly tightness. Patient said sometimes it does get in the way of her daily activities.  Patient denies any significant radiation down the arm.  Denies any weakness.  States that it can be uncomfortable in the lower back as well.      Past Medical History:  Diagnosis Date   Anxiety    Arthritis    spondylosis   Asthma    Bipolar 2 disorder (HCC)    Bipolar disease, chronic (HCC)    Depression    Thyroid disease    Past Surgical History:  Procedure Laterality Date   BUNIONECTOMY     MANDIBLE SURGERY  2009   RECONSTRUCTION OF EYELID  1989 and 1990   Social History   Socioeconomic History   Marital status: Married    Spouse name: Not on file   Number of children: Not on file   Years of education: Not on file   Highest education level: Not on file  Occupational History   Not on file  Tobacco Use   Smoking status: Never   Smokeless tobacco: Never  Vaping Use   Vaping Use: Never used  Substance and Sexual Activity   Alcohol use: Yes    Alcohol/week: 2.0 standard drinks    Types: 2 Glasses of wine per week   Drug use: No   Sexual activity: Yes    Birth control/protection: Pill  Other Topics Concern   Not on file  Social History Narrative   Not on file   Social Determinants of Health   Financial Resource Strain: Not on file  Food Insecurity: Not on file  Transportation Needs: Not on file  Physical Activity: Not on file  Stress: Not on file  Social Connections: Not on file   No Known Allergies Family History  Problem Relation  Age of Onset   Parkinson's disease Mother    Hypertension Father    Stroke Maternal Grandmother     Current Outpatient Medications (Endocrine & Metabolic):    levothyroxine (SYNTHROID) 100 MCG tablet, Take 1 tablet (100 mcg total) by mouth daily before breakfast.   norethindrone-ethinyl estradiol (LOESTRIN) 1-20 MG-MCG tablet, Take 1 tablet by mouth daily.  Current Outpatient Medications (Cardiovascular):    atorvastatin (LIPITOR) 10 MG tablet, TAKE ONE TABLET BY MOUTH ONE TIME DAILY  Current Outpatient Medications (Respiratory):    albuterol (VENTOLIN HFA) 108 (90 Base) MCG/ACT inhaler, USE 2 INHALATIONS EVERY 6 HOURS AS NEEDED FOR WHEEZING OR SHORTNESS OF BREATH   benzonatate (TESSALON PERLES) 100 MG capsule, Take 1 capsule (100 mg total) by mouth 3 (three) times daily as needed for cough. (Patient not taking: Reported on 05/12/2022)   cetirizine-pseudoephedrine (ZYRTEC-D) 5-120 MG tablet, Take 1 tablet by mouth 2 (two) times daily as needed for allergies.   fluticasone (FLONASE) 50 MCG/ACT nasal spray, Place 2 sprays into both nostrils daily.   montelukast (SINGULAIR) 10 MG tablet, Take 1 tablet (10 mg total) by mouth at bedtime.    Current Outpatient Medications (Other):    carbamazepine (TEGRETOL)  200 MG tablet, 600 mg daily. Dr Evelene Croon   carbamide peroxide [facility]) 6.5 % OTIC solution, Place 5 drops into both ears 2 (two) times daily.   famotidine (PEPCID) 20 MG tablet, Take 1 tablet (20 mg total) by mouth daily.   gabapentin (NEURONTIN) 100 MG capsule, Take 2 capsules (200 mg total) by mouth at bedtime.   lamoTRIgine (LAMICTAL) 200 MG tablet, Take 200 mg by mouth daily. Kaur   LORazepam (ATIVAN) 1 MG tablet, Take 1 mg by mouth daily as needed. Dr Evelene Croon   pantoprazole (PROTONIX) 40 MG tablet, Take 1 tablet (40 mg total) by mouth daily.   Reviewed prior external information including notes and imaging from  primary care provider As well as notes that were available from care  everywhere and other healthcare systems.  Past medical history, social, surgical and family history all reviewed in electronic medical record.  No pertanent information unless stated regarding to the chief complaint.   Review of Systems:  No headache, visual changes, nausea, vomiting, diarrhea, constipation, dizziness, abdominal pain, skin rash, fevers, chills, night sweats, weight loss, swollen lymph nodes, body aches, joint swelling, chest pain, shortness of breath, mood changes. POSITIVE muscle aches  Objective  Blood pressure 120/72, pulse (!) 54, height 5\' 5"  (1.651 m), weight 140 lb (63.5 kg), last menstrual period 05/13/2022, SpO2 95 %.   General: No apparent distress alert and oriented x3 mood and affect normal, dressed appropriately.  HEENT: Pupils equal, extraocular movements intact  Respiratory: Patient's speak in full sentences and does not appear short of breath  Cardiovascular: No lower extremity edema, non tender, no erythema  MSK: Neck exam does have some loss of lordosis.  Some tightness noted with FABER test bilaterally.  Patient does have significant tightness noted in the right parascapular region and scapular dyskinesis noted on the right side.  Inferior winging noted of the scapula.  Mild scoliosis of the thoracic spine noted.  Loss of lordosis of the lumbar spine.  Patient does have tightness with FABER test but negative straight leg test.  Osteopathic findings C2 flexed rotated and side bent right C4 flexed rotated and side bent left C6 flexed rotated and side bent left T3 extended rotated and side bent right inhaled third rib T8 extended rotated and side bent right L2 flexed rotated and side bent right L4 flexed rotated and side bent left Sacrum right on right     Impression and Recommendations:     The above documentation has been reviewed and is accurate and complete 05/15/2022, DO

## 2022-05-27 ENCOUNTER — Ambulatory Visit (INDEPENDENT_AMBULATORY_CARE_PROVIDER_SITE_OTHER): Payer: BC Managed Care – PPO | Admitting: Family Medicine

## 2022-05-27 ENCOUNTER — Ambulatory Visit (INDEPENDENT_AMBULATORY_CARE_PROVIDER_SITE_OTHER): Payer: BC Managed Care – PPO

## 2022-05-27 VITALS — BP 120/72 | HR 54 | Ht 65.0 in | Wt 140.0 lb

## 2022-05-27 DIAGNOSIS — M9902 Segmental and somatic dysfunction of thoracic region: Secondary | ICD-10-CM

## 2022-05-27 DIAGNOSIS — M542 Cervicalgia: Secondary | ICD-10-CM

## 2022-05-27 DIAGNOSIS — M9904 Segmental and somatic dysfunction of sacral region: Secondary | ICD-10-CM | POA: Diagnosis not present

## 2022-05-27 DIAGNOSIS — G2589 Other specified extrapyramidal and movement disorders: Secondary | ICD-10-CM | POA: Diagnosis not present

## 2022-05-27 DIAGNOSIS — M549 Dorsalgia, unspecified: Secondary | ICD-10-CM

## 2022-05-27 DIAGNOSIS — M545 Low back pain, unspecified: Secondary | ICD-10-CM | POA: Diagnosis not present

## 2022-05-27 DIAGNOSIS — M9908 Segmental and somatic dysfunction of rib cage: Secondary | ICD-10-CM

## 2022-05-27 DIAGNOSIS — M9903 Segmental and somatic dysfunction of lumbar region: Secondary | ICD-10-CM | POA: Diagnosis not present

## 2022-05-27 DIAGNOSIS — M9901 Segmental and somatic dysfunction of cervical region: Secondary | ICD-10-CM | POA: Diagnosis not present

## 2022-05-27 DIAGNOSIS — F3132 Bipolar disorder, current episode depressed, moderate: Secondary | ICD-10-CM | POA: Diagnosis not present

## 2022-05-27 NOTE — Patient Instructions (Signed)
Good to see you! Do prescribed exercises at least 3x a week Oofos in house Possibly chacos or birkenstocks with back See you again in 6-8 weeks

## 2022-05-28 DIAGNOSIS — G2589 Other specified extrapyramidal and movement disorders: Secondary | ICD-10-CM | POA: Insufficient documentation

## 2022-05-28 DIAGNOSIS — M9901 Segmental and somatic dysfunction of cervical region: Secondary | ICD-10-CM | POA: Insufficient documentation

## 2022-05-28 NOTE — Assessment & Plan Note (Signed)
Scapular dyskinesis noted.  Discussed posture and ergonomics.  Discussed different ergonomic changes that could be beneficial.  Attempted osteopathic manipulation which I think was beneficial.  X-rays pending to further evaluate for any underlying arthritis.  Gabapentin 200 mg at night.  Follow-up again in 6 to 8 weeks

## 2022-05-28 NOTE — Assessment & Plan Note (Signed)

## 2022-05-29 ENCOUNTER — Encounter: Payer: Self-pay | Admitting: Family Medicine

## 2022-06-02 ENCOUNTER — Other Ambulatory Visit: Payer: Self-pay | Admitting: Family Medicine

## 2022-06-04 ENCOUNTER — Telehealth: Payer: Self-pay | Admitting: Internal Medicine

## 2022-06-05 NOTE — Telephone Encounter (Signed)
[facility] and informed her that patiens preferred pharmacy is for home delivery. I informed her that we have no control over the home delivery options. But patients preferred pharmacy is [facility]. Nothing further needed

## 2022-06-10 ENCOUNTER — Encounter: Payer: Self-pay | Admitting: Family Medicine

## 2022-06-17 ENCOUNTER — Encounter: Payer: Self-pay | Admitting: Family Medicine

## 2022-06-18 ENCOUNTER — Other Ambulatory Visit: Payer: Self-pay

## 2022-06-18 ENCOUNTER — Encounter: Payer: Self-pay | Admitting: Family Medicine

## 2022-06-18 DIAGNOSIS — R079 Chest pain, unspecified: Secondary | ICD-10-CM

## 2022-06-20 ENCOUNTER — Other Ambulatory Visit: Payer: Self-pay | Admitting: Internal Medicine

## 2022-06-23 ENCOUNTER — Encounter: Payer: Self-pay | Admitting: Internal Medicine

## 2022-06-24 NOTE — Telephone Encounter (Signed)
Ok to take pepcid indefinitely  - whoever is writing for her protonix will need to see her and re-evaluate the need for it periodically  (if it's me then needs an ov iq 3 m to assess benefit vs cost and potential, but very  rare side effects)

## 2022-06-26 ENCOUNTER — Other Ambulatory Visit: Payer: Self-pay | Admitting: Family Medicine

## 2022-06-26 DIAGNOSIS — J4521 Mild intermittent asthma with (acute) exacerbation: Secondary | ICD-10-CM

## 2022-06-26 NOTE — Telephone Encounter (Signed)
Requested Prescriptions  Pending Prescriptions Disp Refills  . montelukast (SINGULAIR) 10 MG tablet [Pharmacy Med Name: MONTELUKAST SODIUM TABS 10MG ] 90 tablet 3    Sig: TAKE 1 TABLET AT BEDTIME     Pulmonology:  Leukotriene Inhibitors Passed - 06/26/2022  1:27 AM      Passed - Valid encounter within last 12 months    Recent Outpatient Visits          1 month ago Hyperlipidemia, unspecified hyperlipidemia type   [facility] 06/28/2022, MD   5 months ago Aphthous ulcer   [facility] Duanne Limerick, MD   6 months ago Chest pain, unspecified type   [facility], MD   7 months ago Dysfunction of both eustachian tubes   [facility], MD   10 months ago Parkinson's disease [facility])   [facility], MD      Future Appointments            In 1 week Duanne Limerick, Shari Prows, MD [facility], [facility]   In 1 week [facility], DO [facility]   In 4 months Judi Saa, MD [facility], [facility]

## 2022-07-03 NOTE — Progress Notes (Signed)
[name] [address] [place] [address] Phone: [phone] Subjective:   Bruce Donath, am serving as a scribe for Dr. Antoine Primas.  I'm seeing this patient by the request  of:  Duanne Limerick, MD  CC: Back pain follow-up  [id number]  Stephanie Mcguire is a 51 y.o. female coming in with complaint of back and neck pain. OMT on 05/27/2022. Patient states that she is doing ok. Always has tightness in cervical spine and traps. Pain is present between both scapula and she has hard time moving them. No longer using gabapentin.   Medications patient has been prescribed: Gabapentin  Taking: Intermittently         Reviewed prior external information including notes and imaging from previsou exam, outside providers and external EMR if available.   As well as notes that were available from care everywhere and other healthcare systems.  Did review patient's notes from cardiology.  Patient is awaiting an echocardiogram and a CTA to further evaluate some of patient's chest discomfort.  Past medical history, social, surgical and family history all reviewed in electronic medical record.  No pertanent information unless stated regarding to the chief complaint.   Past Medical History:  Diagnosis Date   Anxiety    Aphthous ulcer    Arthritis    spondylosis   Asthma    Bipolar 2 disorder (HCC)    Bipolar disease, chronic (HCC)    Chest pain    Depression    DOE (dyspnea on exertion)    Hyperlipidemia    Thyroid disease     No Known Allergies   Review of Systems:  No headache, visual changes, nausea, vomiting, diarrhea, constipation, dizziness, abdominal pain, skin rash, fevers, chills, night sweats, weight loss, swollen lymph nodes, body aches, joint swelling, chest pain, shortness of breath, mood changes. POSITIVE muscle aches  Objective  Blood pressure 106/72, pulse 97, height 5\' 5"  (1.651 m), weight 147 lb (66.7 kg), SpO2 97 %.    General: No apparent distress alert and oriented x3 mood and affect normal, dressed appropriately.  HEENT: Pupils equal, extraocular movements intact  Respiratory: Patient's speak in full sentences and does not appear short of breath  Cardiovascular: No lower extremity edema, non tender, no erythema  Gait MSK:  Back still has tightness noted in the parascapular region seems to be bilaterally.  Patient does have tightness in the thoracolumbar juncture.  Osteopathic findings  C2 flexed rotated and side bent right C5 flexed rotated and side bent left T3 extended rotated and side bent right inhaled rib T9 extended rotated and side bent left inhaled rib L1 flexed rotated and side bent right Sacrum left on left       Assessment and Plan:  Scapular dyskinesis Still believe likely more secondary to scapular dyskinesis.  Patient is still having some different chest discomfort as well.  Has seen cardiology and is scheduled to have an echocardiogram as well as a CTA.  Discussed with patient with worsening symptoms to include medical attention immediately.  Otherwise I do think that this is mostly musculoskeletal and likely will do well with conservative therapy.  Follow-up with me again in 6 to 8 weeks    Nonallopathic problems  Decision today to treat with OMT was based on Physical Exam  After verbal consent patient was treated with HVLA, ME, FPR techniques in cervical, rib, thoracic, lumbar, and sacral  areas  Patient tolerated the procedure well with improvement in symptoms  Patient given exercises, stretches and lifestyle modifications  See medications in patient instructions if given  Patient will follow up in 4-8 weeks    The above documentation has been reviewed and is accurate and complete Judi Saa, DO          Note: This dictation was prepared with Dragon dictation along with smaller phrase technology. Any transcriptional errors that result from this process  are unintentional.

## 2022-07-03 NOTE — Progress Notes (Unsigned)
Cardiology Office Note:    Date:  07/03/2022   ID:  Stephanie Mcguire, DOB 1971-06-04, MRN [id number]  PCP:  Stephanie Limerick, MD   [facility] Providers Cardiologist:  None { Click to update primary MD,subspecialty MD or APP then REFRESH:1}    Referring MD: Stephanie Limerick, MD   No chief complaint on file. ***  History of Present Illness:    Stephanie Mcguire is a 51 y.o. female with a hx of ***  Past Medical History:  Diagnosis Date   Anxiety    Arthritis    spondylosis   Asthma    Bipolar 2 disorder (HCC)    Bipolar disease, chronic (HCC)    Depression    Thyroid disease     Past Surgical History:  Procedure Laterality Date   BUNIONECTOMY     MANDIBLE SURGERY  2009   RECONSTRUCTION OF EYELID  1989 and 1990    Current Medications: No outpatient medications have been marked as taking for the 07/07/22 encounter (Appointment) with Stephanie Sprague, MD.     Allergies:   Patient has no known allergies.   Social History   Socioeconomic History   Marital status: Married    Spouse name: Not on file   Number of children: Not on file   Years of education: Not on file   Highest education level: Not on file  Occupational History   Not on file  Tobacco Use   Smoking status: Never   Smokeless tobacco: Never  Vaping Use   Vaping Use: Never used  Substance and Sexual Activity   Alcohol use: Yes    Alcohol/week: 2.0 standard drinks of alcohol    Types: 2 Glasses of wine per week   Drug use: No   Sexual activity: Yes    Birth control/protection: Pill  Other Topics Concern   Not on file  Social History Narrative   Not on file   Social Determinants of Health   Financial Resource Strain: Not on file  Food Insecurity: Not on file  Transportation Needs: Not on file  Physical Activity: Insufficiently Active (12/26/2018)   Exercise Vital Sign    Days of Exercise per Week: 3 days    Minutes of Exercise per Session: 30 min  Stress: Not on file  Social  Connections: Not on file     Family History: The patient's ***family history includes Hypertension in her father; Parkinson's disease in her mother; Stroke in her maternal grandmother.  ROS:   Please see the history of present illness.    *** All other systems reviewed and are negative.  EKGs/Labs/Other Studies Reviewed:    The following studies were reviewed today: ***  EKG:  EKG is *** ordered today.  The ekg ordered today demonstrates ***  Recent Labs: 01/15/2022: TSH 0.924 01/23/2022: B Natriuretic Peptide 15.1; BUN 13; Creatinine, Ser 0.88; Hemoglobin 13.5; Platelets 303; Potassium 4.1; Sodium 139  Recent Lipid Panel    Component Value Date/Time   CHOL 240 (H) 05/12/2022 1141   TRIG 56 05/12/2022 1141   HDL 103 05/12/2022 1141   LDLCALC 128 (H) 05/12/2022 1141     Risk Assessment/Calculations:   {Does this patient have ATRIAL FIBRILLATION?:[phone]}       Physical Exam:    VS:  There were no vitals taken for this visit.    Wt Readings from Last 3 Encounters:  05/27/22 140 lb (63.5 kg)  05/12/22 145 lb (65.8 kg)  04/23/22 141 lb (64 kg)  GEN: *** Well nourished, well developed in no acute distress HEENT: Normal NECK: No JVD; No carotid bruits LYMPHATICS: No lymphadenopathy CARDIAC: ***RRR, no murmurs, rubs, gallops RESPIRATORY:  Clear to auscultation without rales, wheezing or rhonchi  ABDOMEN: Soft, non-tender, non-distended MUSCULOSKELETAL:  No edema; No deformity  SKIN: Warm and dry NEUROLOGIC:  Alert and oriented x 3 PSYCHIATRIC:  Normal affect   ASSESSMENT:    No diagnosis found. PLAN:    In order of problems listed above:  ***      {Are you ordering a CV Procedure (e.g. stress test, cath, DCCV, TEE, etc)?   Press F2        :[id number]}    Medication Adjustments/Labs and Tests Ordered: Current medicines are reviewed at length with the patient today.  Concerns regarding medicines are outlined above.  No orders of the defined types  were placed in this encounter.  No orders of the defined types were placed in this encounter.   There are no Patient Instructions on file for this visit.   Signed, Stephanie Sprague, MD  07/03/2022 7:12 AM    [facility]

## 2022-07-07 ENCOUNTER — Encounter: Payer: Self-pay | Admitting: Cardiology

## 2022-07-07 ENCOUNTER — Ambulatory Visit (INDEPENDENT_AMBULATORY_CARE_PROVIDER_SITE_OTHER): Payer: BC Managed Care – PPO | Admitting: Cardiology

## 2022-07-07 VITALS — BP 126/90 | HR 73 | Ht 65.0 in | Wt 138.4 lb

## 2022-07-07 DIAGNOSIS — R072 Precordial pain: Secondary | ICD-10-CM | POA: Diagnosis not present

## 2022-07-07 DIAGNOSIS — E7800 Pure hypercholesterolemia, unspecified: Secondary | ICD-10-CM | POA: Diagnosis not present

## 2022-07-07 MED ORDER — METOPROLOL TARTRATE 100 MG PO TABS: 100.0000 mg | ORAL_TABLET | Freq: Once | ORAL | 0 refills | Status: DC

## 2022-07-07 MED ORDER — NITROGLYCERIN 0.4 MG SL SUBL: 0.4000 mg | SUBLINGUAL_TABLET | SUBLINGUAL | 3 refills | Status: DC | PRN

## 2022-07-07 NOTE — Patient Instructions (Signed)
Medication Instructions:   DR. Shari Prows HAS WRITTEN YOU A SCRIPT FOR NITROGLYCERIN SUBLINGUAL (UNDER THE TONGUE)-- PLEASE PLACE ONE TABLET UNDER THE TONGUE EVERY 5 MINS AS NEEDED FOR CHEST PAIN-- -PLEASE TOUCH BASE WITH DR. Shari Prows IF CHEST PAIN IS IMPROVED WITH THIS MEDICATION   before your next appointment, please call your pharmacy*   Lab Work:  TODAY--BMET  If you have labs (blood work) drawn today and your tests are completely normal, you will receive your results only by: MyChart Message (if you have MyChart) OR A paper copy in the mail If you have any lab test that is abnormal or we need to change your treatment, we will call you to review the results.   Testing/Procedures:  Your physician has requested that you have an echocardiogram. Echocardiography is a painless test that uses sound waves to create images of your heart. It provides your doctor with information about the size and shape of your heart and how well your heart's chambers and valves are working. This procedure takes approximately one hour. There are no restrictions for this procedure.    Your cardiac CT will be scheduled at one of the below locations:   [facility] [address] [place], [place] [address] [phone]   If scheduled at [facility], please arrive at the [facility] and Children's Entrance (Entrance C2) of [facility] 30 minutes prior to test start time. You can use the FREE valet parking offered at entrance C (encouraged to control the heart rate for the test)  Proceed to the [facility] Radiology Department (first floor) to check-in and test prep.  All radiology patients and guests should use entrance C2 at [facility], accessed from [facility], even though the hospital's physical address listed is [address]    Please follow these instructions carefully (unless otherwise directed):   On the Night Before the Test: Be sure  to Drink plenty of water. Do not consume any caffeinated/decaffeinated beverages or chocolate 12 hours prior to your test. Do not take any antihistamines 12 hours prior to your test.   On the Day of the Test: Drink plenty of water until 1 hour prior to the test. Do not eat any food 4 hours prior to the test. You may take your regular medications prior to the test.  Take metoprolol 100 MG BY MOUTH (Lopressor) two hours prior to test. FEMALES- please wear underwire-free bra if available, avoid dresses & tight clothing   After the Test: Drink plenty of water. After receiving IV contrast, you may experience a mild flushed feeling. This is normal. On occasion, you may experience a mild rash up to 24 hours after the test. This is not dangerous. If this occurs, you can take Benadryl 25 mg and increase your fluid intake. If you experience trouble breathing, this can be serious. If it is severe call 911 IMMEDIATELY. If it is mild, please call our office.   We will call to schedule your test 2-4 weeks out understanding that some insurance companies will need an authorization prior to the service being performed.   For non-scheduling related questions, please contact the cardiac imaging nurse navigator should you have any questions/concerns: Rockwell Alexandria, Cardiac Imaging Nurse Navigator Larey Brick, Cardiac Imaging Nurse Navigator [facility] Direct Office Dial: [phone]   For scheduling needs, including cancellations and rescheduling, please call [place], [phone].    Follow-Up: At [facility], you and your health needs are  our priority.  As part of our continuing mission to provide you with exceptional heart care, we have created designated Provider Care Teams.  These Care Teams include your primary Cardiologist (physician) and Advanced Practice Providers (APPs -  Physician Assistants and Nurse Practitioners) who all work together to provide you with  the care you need, when you need it.  We recommend signing up for the patient portal called "MyChart".  Sign up information is provided on this After Visit Summary.  MyChart is used to connect with patients for Virtual Visits (Telemedicine).  Patients are able to view lab/test results, encounter notes, upcoming appointments, etc.  Non-urgent messages can be sent to your provider as well.   To learn more about what you can do with MyChart, go to [email or web address].    Your next appointment:   1 year(s)  The format for your next appointment:   In Person  Provider:   DR. Shari Prows

## 2022-07-07 NOTE — Progress Notes (Signed)
Cardiology Office Note:    Date:  07/07/2022   ID:  Stephanie Mcguire, DOB [birth year], MRN [id number]  PCP:  Duanne Limerick, MD   [facility] Providers Cardiologist:  None {    Referring MD: Duanne Limerick, MD     History of Present Illness:    Stephanie Mcguire is a 51 y.o. female with a hx of anxiety, asthma, bipolar 2 disorder, and depression who was referred by Dr. Yetta Barre for further evaluation of chest pain.  Today, the patient states she has been having chest pain ever since she had COVID in 2021. She has two types of chest pain: one which feels like a fist in her chest when she exerts herself and the other is sharp pain that can occur randomly.  Symptoms occur at least 1x/week and usually last a couple of hours before resolving. Nothing seems to make the symptoms better.  She started anti-reflux medication which helped the sharp pains but not the exertional symptoms. She also has taken her anxiety medication which has not helped. She saw Pulm with a reassuring work-up including normal CPET.   Otherwise, no orthopnea, PND, or LE edema. Has rare intermittent palpitations.  Past Medical History:  Diagnosis Date   Anxiety    Aphthous ulcer    Arthritis    spondylosis   Asthma    Bipolar 2 disorder (HCC)    Bipolar disease, chronic (HCC)    Chest pain    Depression    DOE (dyspnea on exertion)    Hyperlipidemia    Thyroid disease     Past Surgical History:  Procedure Laterality Date   BUNIONECTOMY     MANDIBLE SURGERY  2009   RECONSTRUCTION OF EYELID  1989 and 1990    Current Medications: Current Meds  Medication Sig   albuterol (VENTOLIN HFA) 108 (90 Base) MCG/ACT inhaler USE 2 INHALATIONS EVERY 6 HOURS AS NEEDED FOR WHEEZING OR SHORTNESS OF BREATH   atorvastatin (LIPITOR) 10 MG tablet TAKE ONE TABLET BY MOUTH ONE TIME DAILY   benzonatate (TESSALON PERLES) 100 MG capsule Take 1 capsule (100 mg total) by mouth 3 (three) times daily as needed for cough.    carbamazepine (TEGRETOL) 200 MG tablet 600 mg daily. Dr Evelene Croon   cetirizine-pseudoephedrine (ZYRTEC-D) 5-120 MG tablet Take 1 tablet by mouth 2 (two) times daily as needed for allergies.   diazepam (VALIUM) 2 MG tablet Take 2 mg by mouth as needed.   famotidine (PEPCID) 20 MG tablet TAKE ONE TABLET BY MOUTH ONE TIME DAILY   fluticasone (FLONASE) 50 MCG/ACT nasal spray Place 2 sprays into both nostrils daily.   lamoTRIgine (LAMICTAL) 200 MG tablet Take 200 mg by mouth daily. Evelene Croon   levothyroxine (SYNTHROID) 100 MCG tablet Take 1 tablet (100 mcg total) by mouth daily before breakfast.   LORazepam (ATIVAN) 1 MG tablet Take 1 mg by mouth daily as needed. Dr Evelene Croon   metoprolol tartrate (LOPRESSOR) 100 MG tablet Take 1 tablet (100 mg total) by mouth once for 1 dose. Take 90-120 minutes prior to scan.   nitroGLYCERIN (NITROSTAT) 0.4 MG SL tablet Place 1 tablet (0.4 mg total) under the tongue every 5 (five) minutes as needed for chest pain.   norethindrone-ethinyl estradiol (LOESTRIN) 1-20 MG-MCG tablet Take 1 tablet by mouth daily.   pantoprazole (PROTONIX) 40 MG tablet TAKE ONE TABLET BY MOUTH ONE TIME DAILY     Allergies:   Patient has no known allergies.   Social History  Socioeconomic History   Marital status: Married    Spouse name: Not on file   Number of children: Not on file   Years of education: Not on file   Highest education level: Not on file  Occupational History   Not on file  Tobacco Use   Smoking status: Never   Smokeless tobacco: Never  Vaping Use   Vaping Use: Never used  Substance and Sexual Activity   Alcohol use: Yes    Alcohol/week: 2.0 standard drinks of alcohol    Types: 2 Glasses of wine per week   Drug use: No   Sexual activity: Yes    Birth control/protection: Pill  Other Topics Concern   Not on file  Social History Narrative   Not on file   Social Determinants of Health   Financial Resource Strain: Not on file  Food Insecurity: Not on file   Transportation Needs: Not on file  Physical Activity: Insufficiently Active (12/26/2018)   Exercise Vital Sign    Days of Exercise per Week: 3 days    Minutes of Exercise per Session: 30 min  Stress: Not on file  Social Connections: Not on file     Family History: The patient's family history includes Hypertension in her father; Parkinson's disease in her mother; Stroke in her maternal grandmother.  ROS:   Please see the history of present illness.    (+) Chest pressure/discomfort (+) Shortness of breath/difficulty breathing (+) Lightheadedness (+) Dizziness (+) Palpitations All other systems reviewed and are negative.  EKGs/Labs/Other Studies Reviewed:    The following studies were reviewed today: CPET 05/19/22: Interpretation   Notes: Patient gave a very good effort. Pulse-oximetry remained 98-99% for the duration of exercise (manually monitored). Exercise was performed on a cycle ergometer starting at [facility] and increasing by 15W/min.   ECG:  Resting ECG in normal sinus rhythm. HR response appropriate. There were no sustained arrhythmias or ST-T changes. BP response appropriate.   PFT:  Pre-exercise spirometry was within normal limits. The MVV was normal. Post-exercise spirometry within normal limits.   CPX:  Exercise Capacity- Exercise testing with gas exchange  demonstrates a normal peak VO2 of 23.4 ml/kg/min (100% of  the age/gender/weight matched sedentary norms). The RER of 1.27  indicates a maximal effort. Cardiovascular response- The O2pulse (a surrogate for stroke  volume) increased with incremental exercise reaching peak at 9 ml/beat (100% predicted). DeltaVO2/Delta WR is 11.5 at peal  Indicating no evidence of cardiovascular impairment.   Ventilatory response- The VE/VCO2 slope is normal. The oxygen uptake efficiency slope (OUES) is normal. The VO2 at the ventilatory threshold was normal at 61% of the predicted peak VO2. At peak exercise,  the ventilation reached  62% of the measured MVV and breathing  reserve was 33 indicating ventilatory reserve remained.  PETCO2 was  normal at 39 mmHg during exercise.    Conclusion: Exercise testing with gas exchange demonstrates normal functional capacity when compared to matched sedentary norms. There is no indication for cardiopulmonary abnormality. There is no evidence of exercise-induced bronchospasm.   CT 03/12/22: COMPARISON:  CT chest angiogram, 12/25/2020   FINDINGS: Cardiovascular: No significant vascular findings. Normal heart size. No pericardial effusion.   Mediastinum/Nodes: No enlarged mediastinal, hilar, or axillary lymph nodes. Thyroid gland, trachea, and esophagus demonstrate no significant findings.   Lungs/Pleura: Biapical pleuroparenchymal scarring. Mild, bland appearing bandlike scarring of the dependent left lower lobe (series 10, image 274), dependent right lower lobe (series 10, image 297), and anterior right lower lobe (  series 10, image 246). No significant air trapping on expiratory phase imaging. No pleural effusion or pneumothorax.   Upper Abdomen: No acute abnormality.   Musculoskeletal: No chest wall abnormality. No suspicious osseous lesions identified.   IMPRESSION: 1. No evidence of fibrotic interstitial lung disease. 2. Mild, bland appearing scarring of the bilateral lower lobes and biapical pleuroparenchymal scarring, findings consistent with sequelae of prior infection or inflammation and unchanged compared to examination dated 12/25/2020.    EKG:  EKG is personally reviewed.  07/07/2022 EKG: NSR with HR 73  Recent Labs: 01/15/2022: TSH 0.924 01/23/2022: B Natriuretic Peptide 15.1; BUN 13; Creatinine, Ser 0.88; Hemoglobin 13.5; Platelets 303; Potassium 4.1; Sodium 139  Recent Lipid Panel    Component Value Date/Time   CHOL 240 (H) 05/12/2022 1141   TRIG 56 05/12/2022 1141   HDL 103 05/12/2022 1141   LDLCALC 128 (H) 05/12/2022 1141     Risk  Assessment/Calculations:           Physical Exam:    VS:  BP 126/90   Pulse 73   Ht 5\' 5"  (1.651 m)   Wt 138 lb 6.4 oz (62.8 kg)   SpO2 97%   BMI 23.03 kg/m     Wt Readings from Last 3 Encounters:  07/07/22 138 lb 6.4 oz (62.8 kg)  05/27/22 140 lb (63.5 kg)  05/12/22 145 lb (65.8 kg)     GEN:  Well nourished, well developed in no acute distress HEENT: Normal NECK: No JVD; No carotid bruits CARDIAC: RRR, no murmurs, rubs, gallops RESPIRATORY:  Clear to auscultation without rales, wheezing or rhonchi  ABDOMEN: Soft, non-tender, non-distended MUSCULOSKELETAL:  No edema; No deformity  SKIN: Warm and dry NEUROLOGIC:  Alert and oriented x 3 PSYCHIATRIC:  Normal affect   ASSESSMENT:    1. Precordial pain   2. Pure hypercholesterolemia    PLAN:    In order of problems listed above:  #Chest Pain: Patient reports that she has had chest pain since she had COVID. Has a pressure sensation that worsens with exertion and a sharp element that occurs randomly. Has had a reassuring pulmonary work-up and minimal relief in the pressure sensation with reflux medication. While her symptoms are somewhat atypical, given the exertional nature, will check coronary CTA and TTE for further work-up. If reassuring, could also consider vasospasm.  -Check coronary CTA -Check TTE -Will do trial of SL NTG to see if helps symptoms   #HLD: -Continue lipitor 10mg  daily         Follow up in 1 year  Medication Adjustments/Labs and Tests Ordered: Current medicines are reviewed at length with the patient today.  Concerns regarding medicines are outlined above.  Orders Placed This Encounter  Procedures   CT CORONARY MORPH W/CTA COR W/SCORE W/CA W/CM &/OR WO/CM   Basic metabolic panel   EKG [id number]   ECHOCARDIOGRAM COMPLETE   Meds ordered this encounter  Medications   metoprolol tartrate (LOPRESSOR) 100 MG tablet    Sig: Take 1 tablet (100 mg total) by mouth once for 1 dose. Take 90-120  minutes prior to scan.    Dispense:  1 tablet    Refill:  0   nitroGLYCERIN (NITROSTAT) 0.4 MG SL tablet    Sig: Place 1 tablet (0.4 mg total) under the tongue every 5 (five) minutes as needed for chest pain.    Dispense:  30 tablet    Refill:  3    Patient Instructions  Medication Instructions:   DR. Johney Frame  HAS WRITTEN YOU A SCRIPT FOR NITROGLYCERIN SUBLINGUAL (UNDER THE TONGUE)-- PLEASE PLACE ONE TABLET UNDER THE TONGUE EVERY 5 MINS AS NEEDED FOR CHEST PAIN-- -PLEASE TOUCH BASE WITH DR. Johney Frame IF CHEST PAIN IS IMPROVED WITH THIS MEDICATION   before your next appointment, please call your pharmacy*   Lab Work:  TODAY--BMET  If you have labs (blood work) drawn today and your tests are completely normal, you will receive your results only by: [place] (if you have MyChart) OR A paper copy in the mail If you have any lab test that is abnormal or we need to change your treatment, we will call you to review the results.   Testing/Procedures:  Your physician has requested that you have an echocardiogram. Echocardiography is a painless test that uses sound waves to create images of your heart. It provides your doctor with information about the size and shape of your heart and how well your heart's chambers and valves are working. This procedure takes approximately one hour. There are no restrictions for this procedure.    Your cardiac CT will be scheduled at one of the below locations:   [facility] [address] [place], [place] [address] [phone]   If scheduled at [facility], please arrive at the [facility] and Children's Entrance (Entrance C2) of [facility] 30 minutes prior to test start time. You can use the FREE valet parking offered at entrance C (encouraged to control the heart rate for the test)  Proceed to the [facility] Radiology Department (first floor) to check-in and test prep.  All radiology patients and guests should  use entrance C2 at [facility], accessed from [facility], even though the hospital's physical address listed is [address]    Please follow these instructions carefully (unless otherwise directed):   On the Night Before the Test: Be sure to Drink plenty of water. Do not consume any caffeinated/decaffeinated beverages or chocolate 12 hours prior to your test. Do not take any antihistamines 12 hours prior to your test.   On the Day of the Test: Drink plenty of water until 1 hour prior to the test. Do not eat any food 4 hours prior to the test. You may take your regular medications prior to the test.  Take metoprolol 100 MG BY MOUTH (Lopressor) two hours prior to test. FEMALES- please wear underwire-free bra if available, avoid dresses & tight clothing   After the Test: Drink plenty of water. After receiving IV contrast, you may experience a mild flushed feeling. This is normal. On occasion, you may experience a mild rash up to 24 hours after the test. This is not dangerous. If this occurs, you can take Benadryl 25 mg and increase your fluid intake. If you experience trouble breathing, this can be serious. If it is severe call 911 IMMEDIATELY. If it is mild, please call our office.   We will call to schedule your test 2-4 weeks out understanding that some insurance companies will need an authorization prior to the service being performed.   For non-scheduling related questions, please contact the cardiac imaging nurse navigator should you have any questions/concerns: Marchia Bond, Cardiac Imaging Nurse Navigator Gordy Clement, Cardiac Imaging Nurse Navigator [place] [facility] [facility] Direct Office Dial: [phone]   For scheduling needs, including cancellations and rescheduling, please call [place], [phone].    Follow-Up: At [facility], you and your health needs are our priority.  As part of  our continuing mission to  provide you with exceptional heart care, we have created designated Provider Care Teams.  These Care Teams include your primary Cardiologist (physician) and Advanced Practice Providers (APPs -  Physician Assistants and Nurse Practitioners) who all work together to provide you with the care you need, when you need it.  We recommend signing up for the patient portal called "MyChart".  Sign up information is provided on this After Visit Summary.  MyChart is used to connect with patients for Virtual Visits (Telemedicine).  Patients are able to view lab/test results, encounter notes, upcoming appointments, etc.  Non-urgent messages can be sent to your provider as well.   To learn more about what you can do with MyChart, go to [email or web address].    Your next appointment:   1 year(s)  The format for your next appointment:   In Person  Provider:   DR. Lolita Patella as a scribe for Meriam Sprague, MD.,have documented all relevant documentation on the behalf of Meriam Sprague, MD,as directed by  Meriam Sprague, MD while in the presence of Meriam Sprague, MD.   I, Meriam Sprague, MD, have reviewed all documentation for this visit. The documentation on 07/07/22 for the exam, diagnosis, procedures, and orders are all accurate and complete.

## 2022-07-08 LAB — BASIC METABOLIC PANEL
BUN/Creatinine Ratio: 12 (ref 9–23)
BUN: 10 mg/dL (ref 6–24)
CO2: 24 mmol/L (ref 20–29)
Calcium: 8.9 mg/dL (ref 8.7–10.2)
Chloride: 102 mmol/L (ref 96–106)
Creatinine, Ser: 0.84 mg/dL (ref 0.57–1.00)
Glucose: 110 mg/dL — ABNORMAL HIGH (ref 70–99)
Potassium: 4.2 mmol/L (ref 3.5–5.2)
Sodium: 140 mmol/L (ref 134–144)
eGFR: 84 mL/min/{1.73_m2} (ref >59)

## 2022-07-09 ENCOUNTER — Ambulatory Visit (INDEPENDENT_AMBULATORY_CARE_PROVIDER_SITE_OTHER): Payer: BC Managed Care – PPO | Admitting: Family Medicine

## 2022-07-09 VITALS — BP 106/72 | HR 97 | Ht 65.0 in | Wt 147.0 lb

## 2022-07-09 DIAGNOSIS — M9902 Segmental and somatic dysfunction of thoracic region: Secondary | ICD-10-CM

## 2022-07-09 DIAGNOSIS — M9904 Segmental and somatic dysfunction of sacral region: Secondary | ICD-10-CM | POA: Diagnosis not present

## 2022-07-09 DIAGNOSIS — G2589 Other specified extrapyramidal and movement disorders: Secondary | ICD-10-CM

## 2022-07-09 DIAGNOSIS — M9901 Segmental and somatic dysfunction of cervical region: Secondary | ICD-10-CM | POA: Diagnosis not present

## 2022-07-09 DIAGNOSIS — M9903 Segmental and somatic dysfunction of lumbar region: Secondary | ICD-10-CM | POA: Diagnosis not present

## 2022-07-09 DIAGNOSIS — M9908 Segmental and somatic dysfunction of rib cage: Secondary | ICD-10-CM

## 2022-07-09 NOTE — Patient Instructions (Signed)
Good to see you Wish you luck in [place] Keep working on posture See me in 6-7 weeks

## 2022-07-09 NOTE — Assessment & Plan Note (Signed)
Still believe likely more secondary to scapular dyskinesis.  Patient is still having some different chest discomfort as well.  Has seen cardiology and is scheduled to have an echocardiogram as well as a CTA.  Discussed with patient with worsening symptoms to include medical attention immediately.  Otherwise I do think that this is mostly musculoskeletal and likely will do well with conservative therapy.  Follow-up with me again in 6 to 8 weeks

## 2022-07-14 ENCOUNTER — Ambulatory Visit (HOSPITAL_COMMUNITY): Payer: BC Managed Care – PPO | Attending: Cardiology

## 2022-07-14 ENCOUNTER — Telehealth: Payer: Self-pay | Admitting: *Deleted

## 2022-07-14 DIAGNOSIS — R072 Precordial pain: Secondary | ICD-10-CM | POA: Insufficient documentation

## 2022-07-14 LAB — ECHOCARDIOGRAM COMPLETE
Area-P 1/2: 3.29 cm2
S' Lateral: 2.4 cm

## 2022-07-14 NOTE — Telephone Encounter (Signed)
-----   Message from Lorrin Jackson sent at 07/14/2022 10:21 AM EDT ----- Regarding: ct heart Scheduled 07/30/22 at 3:00

## 2022-07-15 ENCOUNTER — Ambulatory Visit: Payer: BC Managed Care – PPO | Admitting: Family Medicine

## 2022-07-27 DIAGNOSIS — F3174 Bipolar disorder, in full remission, most recent episode manic: Secondary | ICD-10-CM | POA: Diagnosis not present

## 2022-07-27 DIAGNOSIS — F3175 Bipolar disorder, in partial remission, most recent episode depressed: Secondary | ICD-10-CM | POA: Diagnosis not present

## 2022-07-27 DIAGNOSIS — F410 Panic disorder [episodic paroxysmal anxiety] without agoraphobia: Secondary | ICD-10-CM | POA: Diagnosis not present

## 2022-07-28 DIAGNOSIS — R1312 Dysphagia, oropharyngeal phase: Secondary | ICD-10-CM | POA: Diagnosis not present

## 2022-07-28 DIAGNOSIS — E039 Hypothyroidism, unspecified: Secondary | ICD-10-CM | POA: Diagnosis not present

## 2022-07-29 ENCOUNTER — Telehealth (HOSPITAL_COMMUNITY): Payer: Self-pay | Admitting: *Deleted

## 2022-07-29 NOTE — Telephone Encounter (Signed)
Reaching out to patient to offer assistance regarding upcoming cardiac imaging study; pt verbalizes understanding of appt date/time, parking situation and where to check in, pre-test NPO status and medications ordered, and verified current allergies; name and call back number provided for further questions should they arise  Nirel Babler RN Navigator Cardiac Imaging [facility] [phone] office [phone] cell  Patient to take 100mg metoprolol tartrate two hours prior to her cardiac CT scan. She is aware to arrive at 2:30pm. 

## 2022-07-30 ENCOUNTER — Ambulatory Visit (HOSPITAL_COMMUNITY)
Admission: RE | Admit: 2022-07-30 | Discharge: 2022-07-30 | Disposition: A | Discharge status: Home / Self Care | Payer: BC Managed Care – PPO | Source: Ambulatory Visit | Attending: Cardiology | Admitting: Cardiology

## 2022-07-30 DIAGNOSIS — R072 Precordial pain: Secondary | ICD-10-CM | POA: Diagnosis not present

## 2022-07-30 MED ORDER — NITROGLYCERIN 0.4 MG SL SUBL
0.8000 mg | SUBLINGUAL_TABLET | Freq: Once | SUBLINGUAL | Status: AC
  Administered 2022-07-30: 0.8 mg via SUBLINGUAL

## 2022-07-30 MED ORDER — NITROGLYCERIN 0.4 MG SL SUBL
SUBLINGUAL_TABLET | SUBLINGUAL | Status: AC
  Filled 2022-07-30: qty 2

## 2022-07-30 MED ORDER — IOHEXOL 350 MG/ML SOLN
100.0000 mL | Freq: Once | INTRAVENOUS | Status: AC | PRN
  Administered 2022-07-30: 100 mL via INTRAVENOUS

## 2022-07-31 ENCOUNTER — Telehealth: Payer: Self-pay

## 2022-07-31 DIAGNOSIS — R072 Precordial pain: Secondary | ICD-10-CM

## 2022-07-31 NOTE — Telephone Encounter (Signed)
-----   Message from Meriam Sprague, MD sent at 07/31/2022  4:14 PM EDT ----- Sounds like she already had a lung work-up and her symptoms are not better with an antireflux medication. Since she has seen pulm, we can refer to GI to see if they are able to do any further work-up from their end. I am sorry we have not come up with a great answer for her! ----- Message ----- From: Theresia Majors, RN Sent: 07/31/2022   3:50 PM EDT To: Meriam Sprague, MD  Patient would like to know what you would recommend as next steps to determine what would be causing chest pain since cardiac cause has been ruled out.  ----- Message ----- From: Meriam Sprague, MD Sent: 07/30/2022   8:16 PM EDT To: [address] Triage  Her CT scan looks perfect. She has no heart artery disease and her Ca score is 0. This is great news!

## 2022-08-05 ENCOUNTER — Encounter: Payer: Self-pay | Admitting: Family Medicine

## 2022-08-19 NOTE — Progress Notes (Unsigned)
[facility] [address] [place] [address] Phone: [phone] Subjective:   INadine Mcguire, am serving as a scribe for Dr. Antoine Primas.  I'm seeing this patient by the request  of:  Duanne Limerick, MD  CC:   [id number]  Stephanie Mcguire is a 51 y.o. female coming in with complaint of back and neck pain. OMT 07/09/2022. Patient states doing well. No new issues.  Medications patient has been prescribed: Gabapentin  Taking: Intermittently         Reviewed prior external information including notes and imaging from previsou exam, outside providers and external EMR if available.   As well as notes that were available from care everywhere and other healthcare systems.  Past medical history, social, surgical and family history all reviewed in electronic medical record.  No pertanent information unless stated regarding to the chief complaint.   Past Medical History:  Diagnosis Date   Anxiety    Aphthous ulcer    Arthritis    spondylosis   Asthma    Bipolar 2 disorder (HCC)    Bipolar disease, chronic (HCC)    Chest pain    Depression    DOE (dyspnea on exertion)    Hyperlipidemia    Thyroid disease     No Known Allergies   Review of Systems:  No headache, visual changes, nausea, vomiting, diarrhea, constipation, dizziness, abdominal pain, skin rash, fevers, chills, night sweats, weight loss, swollen lymph nodes, body aches, joint swelling, chest pain, shortness of breath, mood changes. POSITIVE muscle aches  Objective  Blood pressure (!) 120/90, pulse 88, height 5\' 5"  (1.651 m), weight 140 lb (63.5 kg), SpO2 98 %.   General: No apparent distress alert and oriented x3 mood and affect normal, dressed appropriately.  HEENT: Pupils equal, extraocular movements intact  Respiratory: Patient's speak in full sentences and does not appear short of breath  Cardiovascular: No lower extremity edema, non tender, no erythema  Gait MSK:   Back low back exam does have some loss of lordosis.  Some tightness noted in the thoracolumbar junction.  Patient does have tightness in the scapular with some scapular dyskinesis noted.  Osteopathic findings  C6 flexed rotated and side bent left T5 extended rotated and side bent left  inhaled rib T9 extended rotated and side bent left L2 flexed rotated and side bent right Sacrum right on right       Assessment and Plan:  Lumbar radiculopathy Patient continues to have some low back pain as well as the upper back pain.  Some of it seems to be secondary to stress.  We did discuss with patient having some tightness in the morning and patient does snore we could consider the possibility of a sleep study.  Patient wants to not go through with that at this moment.  Discussed the gabapentin on a more regular basis again.  Follow-up again in 6 to 8 weeks.    Nonallopathic problems  Decision today to treat with OMT was based on Physical Exam  After verbal consent patient was treated with HVLA, ME, FPR techniques in cervical, rib, thoracic, lumbar, and sacral  areas  Patient tolerated the procedure well with improvement in symptoms  Patient given exercises, stretches and lifestyle modifications  See medications in patient instructions if given  Patient will follow up in 4-8 weeks     The above documentation has been reviewed and is accurate and complete , DO  Note: This dictation was prepared with Dragon dictation along with smaller phrase technology. Any transcriptional errors that result from this process are unintentional.

## 2022-08-20 ENCOUNTER — Ambulatory Visit (INDEPENDENT_AMBULATORY_CARE_PROVIDER_SITE_OTHER): Payer: BC Managed Care – PPO | Admitting: Family Medicine

## 2022-08-20 VITALS — BP 120/90 | HR 88 | Ht 65.0 in | Wt 140.0 lb

## 2022-08-20 DIAGNOSIS — M5416 Radiculopathy, lumbar region: Secondary | ICD-10-CM | POA: Diagnosis not present

## 2022-08-20 DIAGNOSIS — M9903 Segmental and somatic dysfunction of lumbar region: Secondary | ICD-10-CM

## 2022-08-20 DIAGNOSIS — M9902 Segmental and somatic dysfunction of thoracic region: Secondary | ICD-10-CM

## 2022-08-20 DIAGNOSIS — M9904 Segmental and somatic dysfunction of sacral region: Secondary | ICD-10-CM | POA: Diagnosis not present

## 2022-08-20 DIAGNOSIS — M9908 Segmental and somatic dysfunction of rib cage: Secondary | ICD-10-CM

## 2022-08-20 DIAGNOSIS — M9901 Segmental and somatic dysfunction of cervical region: Secondary | ICD-10-CM

## 2022-08-20 NOTE — Patient Instructions (Signed)
Good to see you! Please try to do some stretches after activity No other large changes

## 2022-08-20 NOTE — Assessment & Plan Note (Signed)
Patient continues to have some low back pain as well as the upper back pain.  Some of it seems to be secondary to stress.  We did discuss with patient having some tightness in the morning and patient does snore we could consider the possibility of a sleep study.  Patient wants to not go through with that at this moment.  Discussed the gabapentin on a more regular basis again.  Follow-up again in 6 to 8 weeks.

## 2022-09-01 ENCOUNTER — Encounter: Payer: Self-pay | Admitting: Nurse Practitioner

## 2022-09-14 DIAGNOSIS — Z1231 Encounter for screening mammogram for malignant neoplasm of breast: Secondary | ICD-10-CM | POA: Diagnosis not present

## 2022-09-14 LAB — HM MAMMOGRAPHY

## 2022-09-23 ENCOUNTER — Ambulatory Visit: Payer: BC Managed Care – PPO | Admitting: Family Medicine

## 2022-09-30 NOTE — Progress Notes (Signed)
[facility] [address] [place] [address] Phone: [phone] Subjective:   Stephanie Mcguire, am serving as a scribe for Dr. Antoine Mcguire.  I'm seeing this patient by the request  of:  Stephanie Limerick, MD  CC: Neck and back pain follow-up  [id number]  Stephanie Mcguire is a 51 y.o. female coming in with complaint of back and neck pain. OMT 08/20/2022. Patient states that her back pain has been manageable. No using gabapentin.  Patient has not been doing her exercises extremely regularly.  Continues to have discomfort on a daily basis though.  Likely not having much radicular symptoms of the leg anymore  Medications patient has been prescribed: Gabapentin  Taking:         Reviewed prior external information including notes and imaging from previsou exam, outside providers and external EMR if available.   As well as notes that were available from care everywhere and other healthcare systems.  Past medical history, social, surgical and family history all reviewed in electronic medical record.  No pertanent information unless stated regarding to the chief complaint.   Past Medical History:  Diagnosis Date   Anxiety    Aphthous ulcer    Arthritis    spondylosis   Asthma    Bipolar 2 disorder (HCC)    Bipolar disease, chronic (HCC)    Chest pain    Depression    DOE (dyspnea on exertion)    Hyperlipidemia    Thyroid disease     No Known Allergies   Review of Systems:  No headache, visual changes, nausea, vomiting, diarrhea, constipation, dizziness, abdominal pain, skin rash, fevers, chills, night sweats, weight loss, swollen lymph nodes, body aches, joint swelling, chest pain, shortness of breath, mood changes. POSITIVE muscle aches  Objective  Blood pressure 110/82, pulse (!) 101, height 5\' 5"  (1.651 m), weight 140 lb (63.5 kg), SpO2 94 %.   General: No apparent distress alert and oriented x3 mood and affect normal, dressed  appropriately.  HEENT: Pupils equal, extraocular movements intact  Respiratory: Patient's speak in full sentences and does not appear short of breath  Cardiovascular: No lower extremity edema, non tender, no erythema  Gait MSK:  Back low back to still has more tightness on the left greater than the right.  Patient does still have some more tightness also in the thoracolumbar junction noted.  Tenderness to palpation with sidebending of the neck bilaterally.  Mild increase in kyphosis  Osteopathic findings  C2 flexed rotated and side bent right C5 flexed rotated and side bent left T3 extended rotated and side bent right inhaled rib T9 extended rotated and side bent left L2 flexed rotated and side bent right Sacrum right on right       Assessment and Plan:  Scapular dyskinesis Continue tightness noted.  Discussed the potential gabapentin on a more regular basis.  Discussed posture and ergonomics.  Discussed which activities to do and which ones to avoid.  Discussed icing regimen.  Follow-up again in 6 to 8 weeks    Nonallopathic problems  Decision today to treat with OMT was based on Physical Exam  After verbal consent patient was treated with HVLA, ME, FPR techniques in cervical, rib, thoracic, lumbar, and sacral  areas  Patient tolerated the procedure well with improvement in symptoms  Patient given exercises, stretches and lifestyle modifications  See medications in patient instructions if given  Patient will follow up in 6-8 weeks  The above documentation has been reviewed and is accurate and complete Stephanie Saa, DO         Note: This dictation was prepared with Dragon dictation along with smaller phrase technology. Any transcriptional errors that result from this process are unintentional.

## 2022-10-01 ENCOUNTER — Encounter: Payer: Self-pay | Admitting: Family Medicine

## 2022-10-01 ENCOUNTER — Ambulatory Visit (INDEPENDENT_AMBULATORY_CARE_PROVIDER_SITE_OTHER): Payer: BC Managed Care – PPO | Admitting: Family Medicine

## 2022-10-01 VITALS — BP 110/82 | HR 101 | Ht 65.0 in | Wt 140.0 lb

## 2022-10-01 DIAGNOSIS — M9904 Segmental and somatic dysfunction of sacral region: Secondary | ICD-10-CM

## 2022-10-01 DIAGNOSIS — M9901 Segmental and somatic dysfunction of cervical region: Secondary | ICD-10-CM | POA: Diagnosis not present

## 2022-10-01 DIAGNOSIS — G2589 Other specified extrapyramidal and movement disorders: Secondary | ICD-10-CM

## 2022-10-01 DIAGNOSIS — M9903 Segmental and somatic dysfunction of lumbar region: Secondary | ICD-10-CM

## 2022-10-01 DIAGNOSIS — M9902 Segmental and somatic dysfunction of thoracic region: Secondary | ICD-10-CM

## 2022-10-01 DIAGNOSIS — M9908 Segmental and somatic dysfunction of rib cage: Secondary | ICD-10-CM

## 2022-10-01 NOTE — Assessment & Plan Note (Signed)
Continue tightness noted.  Discussed the potential gabapentin on a more regular basis.  Discussed posture and ergonomics.  Discussed which activities to do and which ones to avoid.  Discussed icing regimen.  Follow-up again in 6 to 8 weeks

## 2022-10-01 NOTE — Patient Instructions (Addendum)
Good to see you!  Watch which way you sit Mattress shopping would be ideal

## 2022-10-02 ENCOUNTER — Encounter: Payer: Self-pay | Admitting: Nurse Practitioner

## 2022-10-02 ENCOUNTER — Ambulatory Visit (INDEPENDENT_AMBULATORY_CARE_PROVIDER_SITE_OTHER): Payer: BC Managed Care – PPO | Admitting: Nurse Practitioner

## 2022-10-02 VITALS — BP 122/80 | HR 88 | Ht 65.0 in | Wt 141.2 lb

## 2022-10-02 DIAGNOSIS — R1310 Dysphagia, unspecified: Secondary | ICD-10-CM

## 2022-10-02 NOTE — Progress Notes (Signed)
10/02/2022 Stephanie [facility] [id number] 1971-10-30   CHIEF COMPLAINT: Difficulty swallowing, atypical chest pain  HISTORY OF PRESENT ILLNESS: Stephanie Mcguire with a past medical history of arthritis, anxiety, depression, bipolar disorder, hypercholesterolemia, and hypothyroidism.  S/P surgery for congenital proptosis in 1989 in 1990 and jaw surgery in 2009.  She presents to our office today as referred by Dr. Carolanne Grumbling for further evaluation regarding atypical chest pain.  Patient also wishes to undergo evaluation for dysphagia.  She complains of having difficulty swallowing since she was in her early 57s. She habitually takes very small bites of food and eats very slowly.  If she places a large amount of food in her mouth she has difficulty swallowing, food gets stuck in the throat/upper esophagus which occurs once monthly.  She drinks water and the stuck food passes down.  She also has difficulty swallowing pills which sometimes gets stuck and she vomits up the pills.  She has heartburn which is fairly well controlled since she started taking Pantoprazole 40 mg in the morning and Famotidine 20 mg at bedtime since January or February 2023 as recommended by her pulmonologist who thought her chest pain was likely reflux related.  She has a history of atypical chest pain which initially started 11/2020 after she had COVID which went away then recurred during the summer 2022.  She was seen by cardiologist Dr. Laurance Flatten 07/07/2022 and an ECHO was done which showed normal LV function. A coronary CT 07/30/2022 was normal, coronary calcium score was 0, no evidence of CAD or active pulmonary disease.  No further cardiac work-up was recommended.  She intermittently has chest tightness/pain which may occur with eating and other times does not.  No exertional chest pain.  No chest pain at this time.  No shortness of breath.  She endorses having a gluten sensitivity.  She develops significant headaches 2  to 3 hours after she eats any gluten product.  She adheres to a gluten-free diet to the best of her ability.  She avoids eating red meat.  She denies having any upper or lower abdominal pain.  She is passing a normal brown bowel movement most days.  No rectal bleeding or black stools.  She denies ever having an EGD or colonoscopy.  She underwent a Cologuard test 01/19/2022 which was negative.  No known family history of esophageal, gastric or colon cancer.      Latest Ref Rng & Units 01/23/2022   11:36 AM 12/25/2020    3:07 PM 04/18/2020    6:21 PM  CBC  WBC 4.0 - 10.5 K/uL 4.9  6.4  6.8   Hemoglobin 12.0 - 15.0 g/dL 65.4  65.0  35.4   Hematocrit 36.0 - 46.0 % 41.2  43.2  43.2   Platelets 150 - 400 K/uL 303  268  222        Latest Ref Rng & Units 07/07/2022    4:08 PM 05/12/2022   11:42 AM 01/23/2022   11:36 AM  CMP  Glucose 70 - 99 mg/dL 656   97   BUN 6 - 24 mg/dL 10   13   Creatinine 8.12 - 1.00 mg/dL 7.51   7.00   Sodium 174 - 144 mmol/L 140   139   Potassium 3.5 - 5.2 mmol/L 4.2   4.1   Chloride 96 - 106 mmol/L 102   102   CO2 20 - 29 mmol/L 24   28   Calcium 8.7 -  10.2 mg/dL 8.9   9.2   AST 0 - 40 IU/L  19      Coronary CT 07/30/2022: IMPRESSION: 1. Coronary calcium score of 0. This was 0 percentile for age-, race-, and sex-matched controls.   2. Normal coronary origin with right dominance.   3. No evidence of CAD.   4.  Consider non-cardiac causes of chest pain  ECHO 07/14/2022: Left ventricular ejection fraction, by estimation, is 60 to 65%. Left ventricular ejection fraction by 3D volume is 65 %. The left ventricle has normal function. The left ventricle has no regional wall motion abnormalities. Left ventricular diastolic parameters are consistent with Grade I diastolic dysfunction (impaired relaxation). 1. Right ventricular systolic function is normal. The right ventricular size is normal. Tricuspid regurgitation signal is inadequate for assessing PA  pressure. 2. 3. The mitral valve is normal in structure. Trivial mitral valve regurgitation. The aortic valve is tricuspid. Aortic valve regurgitation is not visualized. No aortic stenosis is present. 4. The inferior vena cava is normal in size with greater than 50% respiratory variability, suggesting right atrial pressure of 3 mmHg.  Chest CT 03/12/2022: 1. No evidence of fibrotic interstitial lung disease. 2. Mild, bland appearing scarring of the bilateral lower lobes and biapical pleuroparenchymal scarring, findings consistent with sequelae of prior infection or inflammation and unchanged compared to examination dated 12/25/2020.  Past Medical History:  Diagnosis Date   Anxiety    Aphthous ulcer    Arthritis    spondylosis   Asthma    Bipolar 2 disorder (HCC)    Bipolar disease, chronic (HCC)    Chest pain    Depression    DOE (dyspnea on exertion)    Elevated cholesterol 2000   Hyperlipidemia    Thyroid disease    Past Surgical History:  Procedure Laterality Date   BUNIONECTOMY     MANDIBLE SURGERY  2009   RECONSTRUCTION OF EYELID  1989 and 1990   Social History: She is married.  She has 1 son and 1 daughter.  She is a Futures trader.  Non-smoker.  She previously drank one mixed drink or hard cider once daily a few years ago now drinks 1 alcoholic beverage once weekly.  No drug use.   Family History: No known family history of esophageal, gastric or colon cancer.  Mother with Parkinson disease.  Father with hypertension.  Paternal grandfather with diabetes and heart disease.  Maternal grandmother had a stroke.  No Known Allergies    Outpatient Encounter Medications as of 10/02/2022  Medication Sig   albuterol (VENTOLIN HFA) 108 (90 Base) MCG/ACT inhaler USE 2 INHALATIONS EVERY 6 HOURS AS NEEDED FOR WHEEZING OR SHORTNESS OF BREATH   atorvastatin (LIPITOR) 10 MG tablet TAKE ONE TABLET BY MOUTH ONE TIME DAILY   benzonatate (TESSALON PERLES) 100 MG capsule Take 1 capsule  (100 mg total) by mouth 3 (three) times daily as needed for cough.   carbamazepine (TEGRETOL) 200 MG tablet 600 mg daily. Dr Evelene Croon   carbamide peroxide (DEBROX) 6.5 % OTIC solution Place 5 drops into both ears 2 (two) times daily.   cetirizine-pseudoephedrine (ZYRTEC-D) 5-120 MG tablet Take 1 tablet by mouth 2 (two) times daily as needed for allergies.   diazepam (VALIUM) 2 MG tablet Take 2 mg by mouth as needed.   famotidine (PEPCID) 20 MG tablet TAKE ONE TABLET BY MOUTH ONE TIME DAILY   fluticasone (FLONASE) 50 MCG/ACT nasal spray Place 2 sprays into both nostrils daily.   gabapentin (NEURONTIN) 100 MG  capsule TAKE 2 CAPSULES AT BEDTIME   lamoTRIgine (LAMICTAL) 200 MG tablet Take 200 mg by mouth daily. Evelene Croon   levothyroxine (SYNTHROID) 100 MCG tablet Take 1 tablet (100 mcg total) by mouth daily before breakfast.   norethindrone-ethinyl estradiol (LOESTRIN) 1-20 MG-MCG tablet Take 1 tablet by mouth daily.   pantoprazole (PROTONIX) 40 MG tablet TAKE ONE TABLET BY MOUTH ONE TIME DAILY   LORazepam (ATIVAN) 1 MG tablet Take 1 mg by mouth daily as needed. Dr Evelene Croon (Patient not taking: Reported on 10/02/2022)   [DISCONTINUED] metoprolol tartrate (LOPRESSOR) 100 MG tablet Take 1 tablet (100 mg total) by mouth once for 1 dose. Take 90-120 minutes prior to scan.   [DISCONTINUED] montelukast (SINGULAIR) 10 MG tablet TAKE 1 TABLET AT BEDTIME   [DISCONTINUED] nitroGLYCERIN (NITROSTAT) 0.4 MG SL tablet Place 1 tablet (0.4 mg total) under the tongue every 5 (five) minutes as needed for chest pain.   No facility-administered encounter medications on file as of 10/02/2022.    REVIEW OF SYSTEMS:  Gen: +Night sweats. No weight loss.  CV: Denies chest pain, palpitations or edema. Resp: Denies cough, shortness of breath of hemoptysis.  GI: Denies heartburn, dysphagia, stomach or lower abdominal pain. No diarrhea or constipation.  GU : Denies urinary burning, blood in urine, increased urinary frequency or  incontinence. MS: + Back pain.  Derm: Denies rash, itchiness, skin lesions or unhealing ulcers. Psych:+ Anxiety and depression. Heme: Denies bruising, easy bleeding. Neuro:  Denies headaches, dizziness or paresthesias. Endo:  Denies any problems with DM, thyroid or adrenal function.  PHYSICAL EXAM: BP 122/80   Pulse 88   Ht 5\' 5"  (1.651 m)   Wt 141 lb 3.2 oz (64 kg)   SpO2 98%   BMI 23.50 kg/m  Wt Readings from Last 3 Encounters:  10/02/22 141 lb 3.2 oz (64 kg)  10/01/22 140 lb (63.5 kg)  08/20/22 140 lb (63.5 kg)    General: 51 year old female in no acute distress. Head: Normocephalic and atraumatic. Eyes:  Sclerae non-icteric, conjunctive pink. Ears: Normal auditory acuity. Mouth: Dentition intact. No ulcers or lesions.  Neck: Supple, no lymphadenopathy or thyromegaly.  Lungs: Clear bilaterally to auscultation without wheezes, crackles or rhonchi. Heart: Regular rate and rhythm. No murmur, rub or gallop appreciated.  Abdomen: Soft, nondistended.  Mild tenderness to the RUQ and throughout the lower abdomen without rebound or guarding.  No masses. No hepatosplenomegaly. Normoactive bowel sounds x 4 quadrants.  Rectal:  Musculoskeletal: Symmetrical with no gross deformities. Skin: Warm and dry. No rash or lesions on visible extremities. Extremities: No edema. Neurological: Alert oriented x 4, no focal deficits.  Psychological:  Alert and cooperative. Normal mood and affect.  ASSESSMENT AND PLAN:  19) 51 year old female with chronic solid food and pill dysphagia. Never had an EGD.  Patient instructed to cut food into small pieces and chew food thoroughly.  Avoid large pieces of meat, bread and rice. -EGD with possible esophageal dilatation  2) Atypical chest pain, noncardiac which started after she had Covid in 2021. Possible GERD vs esophageal spasms.  Normal coronary CT and ECHO.  Followed by pulmonology without definitive pulmonary disease.  Chest CT 02/2022 showed bilateral  mild bland scarring to the lower lobes consistent with sequela of prior infection or inflammation. -EGD as ordered above -Continue Pantoprazole 40 mg in the morning and Famotidine 20 mg nightly -I discussed scheduling RUQ sonogram to evaluate the gallbladder and CBD if EGD unrevealing  3) Colon cancer screening.  She underwent a Cologuard  test 01/19/2022 which was negative.  She does not wish to pursue a conventional screening colonoscopy. -Continue Cologuard follow-up with PCP        CC:  Juline Patch, MD

## 2022-10-02 NOTE — Progress Notes (Signed)
Addendum: Reviewed and agree with assessment and management plan. Aune Adami M, MD  

## 2022-10-02 NOTE — Patient Instructions (Addendum)
_______________________________________________________  If you are age 52 or older, your body mass index should be between 23-30. Your Body mass index is 23.5 kg/m. If this is out of the aforementioned range listed, please consider follow up with your Primary Care Provider.  If you are age 62 or younger, your body mass index should be between 19-25. Your Body mass index is 23.5 kg/m. If this is out of the aformentioned range listed, please consider follow up with your Primary Care Provider.   ________________________________________________________  The [facility] providers would like to encourage you to use [facility] to communicate with providers for non-urgent requests or questions.  Due to long hold times on the telephone, sending your provider a message by [facility] may be a faster and more efficient way to get a response.  Please allow 48 business hours for a response.  Please remember that this is for non-urgent requests.  _______________________________________________________  Dennis Bast have been scheduled for an endoscopy. Please follow written instructions given to you at your visit today. If you use inhalers (even only as needed), please bring them with you on the day of your procedure.  Continue current medications: Pantoprazole 40 mg once a day, Famotidine 20 mg at bedtime.  Due to recent changes in healthcare laws, you may see the results of your imaging and laboratory studies on MyChart before your provider has had a chance to review them.  We understand that in some cases there may be results that are confusing or concerning to you. Not all laboratory results come back in the same time frame and the provider may be waiting for multiple results in order to interpret others.  Please give [place] 48 hours in order for your provider to thoroughly review all the results before contacting the office for clarification of your results.   It was a pleasure to see you today!  Thank you for trusting me  with your gastrointestinal care!

## 2022-10-19 ENCOUNTER — Other Ambulatory Visit: Payer: Self-pay | Admitting: Internal Medicine

## 2022-10-22 ENCOUNTER — Other Ambulatory Visit: Payer: Self-pay | Admitting: *Deleted

## 2022-10-22 MED ORDER — FAMOTIDINE 20 MG PO TABS: 20.0000 mg | ORAL_TABLET | Freq: Every day | ORAL | 3 refills | Status: DC

## 2022-10-26 ENCOUNTER — Other Ambulatory Visit: Payer: Self-pay | Admitting: Family Medicine

## 2022-10-26 ENCOUNTER — Encounter: Payer: Self-pay | Admitting: Family Medicine

## 2022-10-26 DIAGNOSIS — E785 Hyperlipidemia, unspecified: Secondary | ICD-10-CM

## 2022-10-29 ENCOUNTER — Telehealth: Payer: Self-pay | Admitting: Internal Medicine

## 2022-10-29 DIAGNOSIS — D2261 Melanocytic nevi of right upper limb, including shoulder: Secondary | ICD-10-CM | POA: Diagnosis not present

## 2022-10-29 DIAGNOSIS — D2271 Melanocytic nevi of right lower limb, including hip: Secondary | ICD-10-CM | POA: Diagnosis not present

## 2022-10-29 DIAGNOSIS — D225 Melanocytic nevi of trunk: Secondary | ICD-10-CM | POA: Diagnosis not present

## 2022-10-29 DIAGNOSIS — D2262 Melanocytic nevi of left upper limb, including shoulder: Secondary | ICD-10-CM | POA: Diagnosis not present

## 2022-10-29 MED ORDER — PANTOPRAZOLE SODIUM 40 MG PO TBEC: 40.0000 mg | DELAYED_RELEASE_TABLET | Freq: Every day | ORAL | 3 refills | Status: DC

## 2022-10-29 NOTE — Telephone Encounter (Signed)
Called and spoke with patient about what medication she is needing refilled. She verified pharmacy with me over the phone. Refill sent. Nothing further needed

## 2022-11-01 DIAGNOSIS — Z23 Encounter for immunization: Secondary | ICD-10-CM | POA: Diagnosis not present

## 2022-11-05 ENCOUNTER — Ambulatory Visit: Payer: BC Managed Care – PPO | Admitting: Family Medicine

## 2022-11-12 ENCOUNTER — Ambulatory Visit: Payer: BC Managed Care – PPO | Admitting: Family Medicine

## 2022-11-12 DIAGNOSIS — G252 Other specified forms of tremor: Secondary | ICD-10-CM | POA: Diagnosis not present

## 2022-11-13 ENCOUNTER — Encounter: Payer: Self-pay | Admitting: Family Medicine

## 2022-11-13 ENCOUNTER — Ambulatory Visit (INDEPENDENT_AMBULATORY_CARE_PROVIDER_SITE_OTHER): Payer: BC Managed Care – PPO | Admitting: Family Medicine

## 2022-11-13 VITALS — BP 100/68 | HR 80 | Ht 65.0 in | Wt 141.0 lb

## 2022-11-13 DIAGNOSIS — E785 Hyperlipidemia, unspecified: Secondary | ICD-10-CM | POA: Diagnosis not present

## 2022-11-13 DIAGNOSIS — R69 Illness, unspecified: Secondary | ICD-10-CM

## 2022-11-13 MED ORDER — ATORVASTATIN CALCIUM 10 MG PO TABS: 10.0000 mg | ORAL_TABLET | Freq: Every day | ORAL | 1 refills | Status: DC

## 2022-11-13 NOTE — Progress Notes (Signed)
Date:  11/13/2022   Name:  Stephanie Mcguire   DOB:  03-24-1971   MRN:  [id number]   Chief Complaint: Hyperlipidemia  Hyperlipidemia This is a chronic problem. The current episode started more than 1 year ago. The problem is controlled. Recent lipid tests were reviewed and are normal. She has no history of chronic renal disease, diabetes, hypothyroidism, liver disease, obesity or nephrotic syndrome. There are no known factors aggravating her hyperlipidemia. Pertinent negatives include no chest pain, focal sensory loss, focal weakness, leg pain, myalgias or shortness of breath. Current antihyperlipidemic treatment includes statins. The current treatment provides moderate improvement of lipids. There are no compliance problems.  Risk factors for coronary artery disease include dyslipidemia and hypertension.    Lab Results  Component Value Date   NA 140 07/07/2022   K 4.2 07/07/2022   CO2 24 07/07/2022   GLUCOSE 110 (H) 07/07/2022   BUN 10 07/07/2022   CREATININE 0.84 07/07/2022   CALCIUM 8.9 07/07/2022   EGFR 84 07/07/2022   GFRNONAA >60 01/23/2022   Lab Results  Component Value Date   CHOL 240 (H) 05/12/2022   HDL 103 05/12/2022   LDLCALC 128 (H) 05/12/2022   TRIG 56 05/12/2022   Lab Results  Component Value Date   TSH 0.924 01/15/2022   No results found for: "HGBA1C" Lab Results  Component Value Date   WBC 4.9 01/23/2022   HGB 13.5 01/23/2022   HCT 41.2 01/23/2022   MCV 95.2 01/23/2022   PLT 303 01/23/2022   Lab Results  Component Value Date   AST 19 05/12/2022   No results found for: "25OHVITD2", "25OHVITD3", "VD25OH"   Review of Systems  Constitutional: Negative.  Negative for chills and unexpected weight change.  HENT:  Negative for congestion, ear discharge, ear pain, rhinorrhea and sore throat.   Respiratory:  Negative for cough, shortness of breath and wheezing.   Cardiovascular:  Negative for chest pain.  Gastrointestinal:  Negative for abdominal pain  and nausea.  Genitourinary:  Negative for difficulty urinating and hematuria.  Musculoskeletal:  Negative for myalgias.  Skin:  Negative for rash.  Neurological:  Negative for dizziness, focal weakness, weakness and headaches.  Hematological:  Negative for adenopathy. Does not bruise/bleed easily.  Psychiatric/Behavioral:  Negative for dysphoric mood. The patient is not nervous/anxious.     Patient Active Problem List   Diagnosis Date Noted   Scapular dyskinesis 05/28/2022   Somatic dysfunction of spine, cervical 05/28/2022   Right carpal tunnel syndrome 12/17/2021   Right elbow pain 11/12/2021   Lumbar radiculopathy 10/17/2020   Patellofemoral arthritis of right knee 10/01/2020   Acute medial meniscal tear, right, initial encounter 08/02/2020   Interstitial lung disease ([place]) 10/14/2015   DOE (dyspnea on exertion) 10/14/2015   Congenital hypothyroidism without goiter 10/14/2015   Abnormal chest CT 10/14/2015   Essential vulvodynia 12/10/2011    No Known Allergies  Past Surgical History:  Procedure Laterality Date   BUNIONECTOMY     MANDIBLE SURGERY  2009   RECONSTRUCTION OF EYELID  1989 and 1990    Social History   Tobacco Use   Smoking status: Never   Smokeless tobacco: Never  Vaping Use   Vaping Use: Never used  Substance Use Topics   Alcohol use: Yes    Alcohol/week: 2.0 standard drinks of alcohol    Types: 2 Glasses of wine per week    Comment: rarly   Drug use: No     Medication list has been  reviewed and updated.  Current Meds  Medication Sig   albuterol (VENTOLIN HFA) 108 (90 Base) MCG/ACT inhaler USE 2 INHALATIONS EVERY 6 HOURS AS NEEDED FOR WHEEZING OR SHORTNESS OF BREATH   atorvastatin (LIPITOR) 10 MG tablet TAKE 1 TABLET DAILY   carbamazepine (TEGRETOL) 200 MG tablet 600 mg daily. Dr [facility]   carbamide peroxide (DEBROX) 6.5 % OTIC solution Place 5 drops into both ears 2 (two) times daily.   cetirizine-pseudoephedrine (ZYRTEC-D) 5-120 MG tablet Take  1 tablet by mouth 2 (two) times daily as needed for allergies.   diazepam (VALIUM) 2 MG tablet Take 2 mg by mouth as needed.   famotidine (PEPCID) 20 MG tablet Take 1 tablet (20 mg total) by mouth daily.   fluticasone (FLONASE) 50 MCG/ACT nasal spray Place 2 sprays into both nostrils daily.   gabapentin (NEURONTIN) 100 MG capsule TAKE 2 CAPSULES AT BEDTIME   lamoTRIgine (LAMICTAL) 200 MG tablet Take 200 mg by mouth daily. [facility]   levothyroxine (SYNTHROID) 100 MCG tablet Take 1 tablet (100 mcg total) by mouth daily before breakfast. (Patient taking differently: Take 100 mcg by mouth daily before breakfast. endo)   LORazepam (ATIVAN) 1 MG tablet Take 1 mg by mouth daily as needed. Dr [facility]   norethindrone-ethinyl estradiol (LOESTRIN) 1-20 MG-MCG tablet Take 1 tablet by mouth daily.   pantoprazole (PROTONIX) 40 MG tablet Take 1 tablet (40 mg total) by mouth daily.       11/13/2022   10:39 AM 05/12/2022   10:20 AM 01/15/2022    1:50 PM 06/17/2021   11:02 AM  GAD 7 : Generalized Anxiety Score  Nervous, Anxious, on Edge 0 3 0 0  Control/stop worrying 0 1 0 0  Worry too much - different things 0 3 0 0  Trouble relaxing 0 2 0 0  Restless 0 2 0 0  Easily annoyed or irritable 0 2 0 0  Afraid - awful might happen 0 1 0 0  Total GAD 7 Score 0 14 0 0  Anxiety Difficulty Not difficult at all Somewhat difficult Not difficult at all        11/13/2022   10:39 AM 05/12/2022   10:19 AM 01/15/2022    1:50 PM  Depression screen PHQ 2/9  Decreased Interest 0 1 0  Down, Depressed, Hopeless 0 2 0  PHQ - 2 Score 0 3 0  Altered sleeping 0 1 0  Tired, decreased energy 0 3 0  Change in appetite 0 1 0  Feeling bad or failure about yourself  0 0 0  Trouble concentrating 0 2 0  Moving slowly or fidgety/restless 0 1 0  Suicidal thoughts 0 0 0  PHQ-9 Score 0 11 0  Difficult doing work/chores Not difficult at all Very difficult Not difficult at all    BP Readings from Last 3 Encounters:  11/13/22 100/68   10/02/22 122/80  10/01/22 110/82    Physical Exam Vitals and nursing note reviewed. Exam conducted with a chaperone present.  Constitutional:      General: She is not in acute distress.    Appearance: She is not diaphoretic.  HENT:     Head: Normocephalic and atraumatic.     Right Ear: Tympanic membrane and external ear normal.     Left Ear: Tympanic membrane and external ear normal.     Nose: Nose normal.     Mouth/Throat:     Mouth: Mucous membranes are moist.  Eyes:     General:  Right eye: No discharge.        Left eye: No discharge.     Conjunctiva/sclera: Conjunctivae normal.     Pupils: Pupils are equal, round, and reactive to light.  Cardiovascular:     Rate and Rhythm: Normal rate and regular rhythm.     Heart sounds: Normal heart sounds. No murmur heard.    No friction rub. No gallop.  Pulmonary:     Effort: Pulmonary effort is normal.     Breath sounds: Normal breath sounds. No wheezing or rhonchi.  Abdominal:     General: Bowel sounds are normal.     Palpations: Abdomen is soft. There is no mass.     Tenderness: There is no abdominal tenderness. There is no guarding.  Musculoskeletal:        General: Normal range of motion.     Cervical back: Neck supple.  Skin:    General: Skin is warm and dry.  Neurological:     Mental Status: She is alert.     Wt Readings from Last 3 Encounters:  11/13/22 141 lb (64 kg)  10/02/22 141 lb 3.2 oz (64 kg)  10/01/22 140 lb (63.5 kg)    BP 100/68   Pulse 80   Ht _0  (1.651 m)   Wt 141 lb (64 kg)   LMP 10/11/2022 (Approximate)   SpO2 96%   BMI 23.46 kg/m   Assessment and Plan:  1. Hyperlipidemia, unspecified hyperlipidemia type Chronic.  Controlled.  Stable.  Continue atorvastatin 10 mg once a day.  Will check lipid panel for current status of LDL. - atorvastatin (LIPITOR) 10 MG tablet; Take 1 tablet (10 mg total) by mouth daily.  Dispense: 90 tablet; Refill: 1 - Lipid Panel With LDL/HDL Ratio  2.  Taking medication for chronic disease Chronic.  Patient on statin and we will check CMP to reassure her that there is no hepatotoxicity from the statin therapy. - Comprehensive Metabolic Panel (CMET)    Otilio Miu, MD

## 2022-11-14 LAB — COMPREHENSIVE METABOLIC PANEL
ALT: 12 IU/L (ref 0–32)
AST: 16 IU/L (ref 0–40)
Albumin/Globulin Ratio: 2.2 (ref 1.2–2.2)
Albumin: 4.4 g/dL (ref 3.8–4.9)
Alkaline Phosphatase: 66 IU/L (ref 44–121)
BUN/Creatinine Ratio: 10 (ref 9–23)
BUN: 10 mg/dL (ref 6–24)
Bilirubin Total: <0.2 mg/dL (ref 0.0–1.2)
CO2: 26 mmol/L (ref 20–29)
Calcium: 9 mg/dL (ref 8.7–10.2)
Chloride: 102 mmol/L (ref 96–106)
Creatinine, Ser: 0.96 mg/dL (ref 0.57–1.00)
Globulin, Total: 2 g/dL (ref 1.5–4.5)
Glucose: 84 mg/dL (ref 70–99)
Potassium: 4.3 mmol/L (ref 3.5–5.2)
Sodium: 139 mmol/L (ref 134–144)
Total Protein: 6.4 g/dL (ref 6.0–8.5)
eGFR: 71 mL/min/{1.73_m2} (ref >59)

## 2022-11-14 LAB — LIPID PANEL WITH LDL/HDL RATIO
Cholesterol, Total: 254 mg/dL — ABNORMAL HIGH (ref 100–199)
HDL: 94 mg/dL (ref >39)
LDL Chol Calc (NIH): 150 mg/dL — ABNORMAL HIGH (ref 0–99)
LDL/HDL Ratio: 1.6 ratio (ref 0.0–3.2)
Triglycerides: 64 mg/dL (ref 0–149)
VLDL Cholesterol Cal: 10 mg/dL (ref 5–40)

## 2022-11-16 NOTE — Progress Notes (Unsigned)
[name] [address] [place] [address] Phone: [phone] Subjective:   Stephanie Mcguire, am serving as a scribe for Dr. Antoine Primas.  I'm seeing this patient by the request  of:  Duanne Limerick, MD  CC: Back and neck pain follow-up  [id number]  Stephanie Mcguire is a 51 y.o. female coming in with complaint of back and neck pain. OMT 10/01/2022. Patient states that her pain in lower back has increased due to pushing this appointment out. Using gabapentin prn.   Medications patient has been prescribed: None  Taking:         Reviewed prior external information including notes and imaging from previsou exam, outside providers and external EMR if available.   As well as notes that were available from care everywhere and other healthcare systems.  Past medical history, social, surgical and family history all reviewed in electronic medical record.  No pertanent information unless stated regarding to the chief complaint.   Past Medical History:  Diagnosis Date   Anxiety    Aphthous ulcer    Arthritis    spondylosis   Asthma    Bipolar 2 disorder (HCC)    Bipolar disease, chronic (HCC)    Chest pain    Depression    DOE (dyspnea on exertion)    Elevated cholesterol 2000   Hyperlipidemia    Thyroid disease     No Known Allergies   Review of Systems:  No headache, visual changes, nausea, vomiting, diarrhea, constipation, dizziness, abdominal pain, skin rash, fevers, chills, night sweats, weight loss, swollen lymph nodes, body aches, joint swelling, chest pain, shortness of breath, mood changes. POSITIVE muscle aches  Objective  Blood pressure 104/70, pulse 90, height 5\' 5"  (1.651 m), weight 141 lb (64 kg), last menstrual period 10/11/2022, SpO2 98 %.   General: No apparent distress alert and oriented x3 mood and affect normal, dressed appropriately.  HEENT: Pupils equal, extraocular movements intact  Respiratory: Patient's  speak in full sentences and does not appear short of breath  Cardiovascular: No lower extremity edema, non tender, no erythema  MSK:  Back does have some loss of lordosis.  Patient does have some tightness in the parascapular region right greater than left.  Tenderness to palpation in the lower back as well.  Osteopathic findings  C5 flexed rotated and side bent right T3 extended rotated and side bent right inhaled rib L1 flexed rotated and side bent right Sacrum right on right       Assessment and Plan:  Scapular dyskinesis Acute upper back pain noted today.  Starting to get some difficulty with her neck as well.  It has been quite a while since we have seen patient.  Patient wants to not do any other type of medication so.  Does feel if she gets regular manipulation does seem to help sometimes.  Discussed icing regimen and home exercises.  Discussed posture and ergonomics.  Follow-up again in 6 to 8 weeks.    Nonallopathic problems  Decision today to treat with OMT was based on Physical Exam  After verbal consent patient was treated with HVLA, ME, FPR techniques in cervical, rib, thoracic, lumbar, and sacral  areas  Patient tolerated the procedure well with improvement in symptoms  Patient given exercises, stretches and lifestyle modifications  See medications in patient instructions if given  Patient will follow up in 4-8 weeks     The above documentation has been reviewed and is accurate  and complete Stephanie Pulley, DO         Note: This dictation was prepared with Dragon dictation along with smaller phrase technology. Any transcriptional errors that result from this process are unintentional.

## 2022-11-17 ENCOUNTER — Encounter: Payer: BC Managed Care – PPO | Admitting: Internal Medicine

## 2022-11-18 ENCOUNTER — Ambulatory Visit (INDEPENDENT_AMBULATORY_CARE_PROVIDER_SITE_OTHER): Payer: BC Managed Care – PPO | Admitting: Family Medicine

## 2022-11-18 VITALS — BP 104/70 | HR 90 | Ht 65.0 in | Wt 141.0 lb

## 2022-11-18 DIAGNOSIS — G2589 Other specified extrapyramidal and movement disorders: Secondary | ICD-10-CM | POA: Diagnosis not present

## 2022-11-18 DIAGNOSIS — M9908 Segmental and somatic dysfunction of rib cage: Secondary | ICD-10-CM

## 2022-11-18 DIAGNOSIS — M9902 Segmental and somatic dysfunction of thoracic region: Secondary | ICD-10-CM

## 2022-11-18 DIAGNOSIS — M9901 Segmental and somatic dysfunction of cervical region: Secondary | ICD-10-CM

## 2022-11-18 DIAGNOSIS — M9903 Segmental and somatic dysfunction of lumbar region: Secondary | ICD-10-CM | POA: Diagnosis not present

## 2022-11-18 DIAGNOSIS — M9904 Segmental and somatic dysfunction of sacral region: Secondary | ICD-10-CM | POA: Diagnosis not present

## 2022-11-18 NOTE — Patient Instructions (Signed)
Great to see you as always Happy holidays!  See me again in 6-8 weeks

## 2022-11-18 NOTE — Assessment & Plan Note (Signed)
Acute upper back pain noted today.  Starting to get some difficulty with her neck as well.  It has been quite a while since we have seen patient.  Patient wants to not do any other type of medication so.  Does feel if she gets regular manipulation does seem to help sometimes.  Discussed icing regimen and home exercises.  Discussed posture and ergonomics.  Follow-up again in 6 to 8 weeks.

## 2022-11-24 ENCOUNTER — Telehealth: Payer: Self-pay | Admitting: Internal Medicine

## 2022-11-24 NOTE — Telephone Encounter (Signed)
Patient has been sick for the past few days she has a sore throat cough and congestion.

## 2022-11-24 NOTE — Telephone Encounter (Signed)
Patient called states she is scheduled for an EGD on 11/26/22 and has a slight cold from over the weekend wondering if that would be ok. Please advise.

## 2022-11-26 ENCOUNTER — Telehealth: Payer: Self-pay | Admitting: Family Medicine

## 2022-11-26 ENCOUNTER — Other Ambulatory Visit: Payer: Self-pay

## 2022-11-26 ENCOUNTER — Encounter: Payer: BC Managed Care – PPO | Admitting: Internal Medicine

## 2022-11-26 DIAGNOSIS — E785 Hyperlipidemia, unspecified: Secondary | ICD-10-CM

## 2022-11-26 MED ORDER — ATORVASTATIN CALCIUM 20 MG PO TABS: 20.0000 mg | ORAL_TABLET | Freq: Every day | ORAL | 0 refills | Status: DC

## 2022-11-26 NOTE — Telephone Encounter (Signed)
Medication Refill - Medication: atorvastatin (LIPITOR) 20 MG tablet   Pt came in to see PCP on 11/13/2022, did labs, and a nurse called her back and advised increasing atorvastatin to 20 mg once a day. Pt states the refill was still sent to [facility] as 10 MG, and they are stating it's too early to fill.  Please advise.   Has the patient contacted their pharmacy? Yes.   (Agent: If no, request that the patient contact the pharmacy for the refill. If patient does not wish to contact the pharmacy document the reason why and proceed with request.)   Preferred Pharmacy (with phone number or street name):  [facility] - [place], [place] - [address]  [address] [place] [address]  Phone: [phone] Fax: [phone]  Hours: Not open 24 hours   Has the patient been seen for an appointment in the last year OR does the patient have an upcoming appointment? Yes.    Agent: Please be advised that RX refills may take up to 3 business days. We ask that you follow-up with your pharmacy.

## 2022-11-26 NOTE — Telephone Encounter (Signed)
Patient requesting increase dosage for liptior, discussed at last OV. Please advise.

## 2022-12-15 ENCOUNTER — Encounter: Payer: Self-pay | Admitting: Internal Medicine

## 2022-12-15 ENCOUNTER — Ambulatory Visit (AMBULATORY_SURGERY_CENTER): Payer: BC Managed Care – PPO | Admitting: Internal Medicine

## 2022-12-15 VITALS — BP 141/91 | HR 85 | Temp 98.4°F | Resp 17 | Ht 65.0 in | Wt 141.0 lb

## 2022-12-15 DIAGNOSIS — R1310 Dysphagia, unspecified: Secondary | ICD-10-CM

## 2022-12-15 DIAGNOSIS — R0789 Other chest pain: Secondary | ICD-10-CM

## 2022-12-15 DIAGNOSIS — K229 Disease of esophagus, unspecified: Secondary | ICD-10-CM

## 2022-12-15 DIAGNOSIS — K219 Gastro-esophageal reflux disease without esophagitis: Secondary | ICD-10-CM | POA: Diagnosis not present

## 2022-12-15 DIAGNOSIS — K222 Esophageal obstruction: Secondary | ICD-10-CM

## 2022-12-15 DIAGNOSIS — K2100 Gastro-esophageal reflux disease with esophagitis, without bleeding: Secondary | ICD-10-CM | POA: Diagnosis not present

## 2022-12-15 MED ORDER — SODIUM CHLORIDE 0.9 % IV SOLN: 500.0000 mL | Freq: Once | INTRAVENOUS | Status: DC

## 2022-12-15 NOTE — Patient Instructions (Addendum)
Patient has a contact number available for emergencies. The signs and symptoms of potential delayed complications were discussed with the patient. Return to normal potential delayed complications were discussed with the patient. Return to normal activities tomorrow. Written discharge instructions were provided to the patient. - Resume previous diet. - Continue present medications. Continue pantoprazole 40 mg daily. Famotidine 20 mg can be used for breakthrough heartburn/GERD/atypical chest pain in the evening if needed. - Await pathology results. - If persistent pill dysphagia proceed to esophagram with tablet.  Await pathology results.  Handout on hiatal hernia and Post esophageal dilation diet provided.   YOU HAD AN ENDOSCOPIC PROCEDURE TODAY AT THE [facility]:   Refer to the procedure report that was given to you for any specific questions about what was found during the examination.  If the procedure report does not answer your questions, please call your gastroenterologist to clarify.  If you requested that your care partner not be given the details of your procedure findings, then the procedure report has been included in a sealed envelope for you to review at your convenience later.  YOU SHOULD EXPECT: Some feelings of bloating in the abdomen. Passage of more gas than usual.  Walking can help get rid of the air that was put into your GI tract during the procedure and reduce the bloating. If you had a lower endoscopy (such as a colonoscopy or flexible sigmoidoscopy) you may notice spotting of blood in your stool or on the toilet paper. If you underwent a bowel prep for your procedure, you may not have a normal bowel movement for a few days.  Please Note:  You might notice some irritation and congestion in your nose or some drainage.  This is from the oxygen used during your procedure.  There is no need for concern and it should clear up in a day or so.  SYMPTOMS TO REPORT  IMMEDIATELY:   Following upper endoscopy (EGD)  Vomiting of blood or coffee ground material  New chest pain or pain under the shoulder blades  Painful or persistently difficult swallowing  New shortness of breath  Fever of 100F or higher  Black, tarry-looking stools  For urgent or emergent issues, a gastroenterologist can be reached at any hour by calling [phone]. Do not use MyChart messaging for urgent concerns.    DIET:  Nothing by mouth until 12:15pm, then clear liquids for 1 hour (12:15-1:15).  After 1:15pm today, you may have a soft diet.  Resume prior diet tomorrow morning.  Drink plenty of fluids but you should avoid alcoholic beverages for 24 hours.  ACTIVITY:  You should plan to take it easy for the rest of today and you should NOT DRIVE or use heavy machinery until tomorrow (because of the sedation medicines used during the test).    FOLLOW UP: Our staff will call the number listed on your records the next business day following your procedure.  We will call around 7:15- 8:00 am to check on you and address any questions or concerns that you may have regarding the information given to you following your procedure. If we do not reach you, we will leave a message.     If any biopsies were taken you will be contacted by phone or by letter within the next 1-3 weeks.  Please call us at [phone] if you have not heard about the biopsies in 3 weeks.    SIGNATURES/CONFIDENTIALITY: You and/or your care partner have signed paperwork which  will be entered into your electronic medical record.  These signatures attest to the fact that that the information above on your After Visit Summary has been reviewed and is understood.  Full responsibility of the confidentiality of this discharge information lies with you and/or your care-partner.

## 2022-12-15 NOTE — Op Note (Signed)
[facility] Patient Name: Stephanie Mcguire Procedure Date: 12/15/2022 10:47 AM MRN: [id number] Endoscopist: Beverley Fiedler , MD, [id number] Age: 51 Referring MD:  Date of Birth: Oct 16, 1971 Gender: Female Account #: [id number] Procedure:                Upper GI endoscopy Indications:              Dysphagia, Heartburn, atypical chest pain Medicines:                Monitored Anesthesia Care Procedure:                Pre-Anesthesia Assessment:                           - Prior to the procedure, a History and Physical                            was performed, and patient medications and                            allergies were reviewed. The patient's tolerance of                            previous anesthesia was also reviewed. The risks                            and benefits of the procedure and the sedation                            options and risks were discussed with the patient.                            All questions were answered, and informed consent                            was obtained. Prior Anticoagulants: The patient has                            taken no anticoagulant or antiplatelet agents. ASA                            Grade Assessment: II - A patient with mild systemic                            disease. After reviewing the risks and benefits,                            the patient was deemed in satisfactory condition to                            undergo the procedure.                           After obtaining informed consent, the endoscope was  passed under direct vision. Throughout the                            procedure, the patient's blood pressure, pulse, and                            oxygen saturations were monitored continuously. The                            Endoscope was introduced through the mouth, and                            advanced to the second part of duodenum. The upper                            GI  endoscopy was accomplished without difficulty.                            The patient tolerated the procedure well. Scope In: Scope Out: Findings:                 One benign-appearing, intrinsic mild                            (non-circumferential scarring) stenosis was found                            38 cm from the incisors. The stenosis was                            traversed. A TTS dilator was passed through the                            scope. Dilation with an 18-19-20 mm balloon dilator                            was performed to 20 mm.                           The Z-line was irregular and was found 38 cm from                            the incisors. Biopsies were taken with a cold                            forceps for histology to exclude Barrett's                            esophagus.                           A 2 cm hiatal hernia was present.                           The gastroesophageal flap valve was  visualized                            endoscopically and classified as Hill Grade III                            (minimal fold, loose to endoscope, hiatal hernia                            likely).                           The entire examined stomach was normal.                           The examined duodenum was normal. Complications:            No immediate complications. Estimated Blood Loss:     Estimated blood loss was minimal. Impression:               - Benign-appearing esophageal stenosis. Dilated to                            20 mm with balloon.                           - Z-line irregular, 38 cm from the incisors.                            Biopsied to exclude Barrett's esophagus.                           - 2 cm hiatal hernia.                           - Gastroesophageal flap valve classified as Hill                            Grade III (minimal fold, loose to endoscope, hiatal                            hernia likely).                           - Normal  stomach.                           - Normal examined duodenum. Recommendation:           - Patient has a contact number available for                            emergencies. The signs and symptoms of potential                            delayed complications were discussed with the  patient. Return to normal activities tomorrow.                            Written discharge instructions were provided to the                            patient.                           - Resume previous diet.                           - Continue present medications. Continue                            pantoprazole 40 mg daily. Famotidine 20 mg can be                            used for breakthrough heartburn/GERD/atypical chest                            pain in the evening if needed.                           - Await pathology results.                           - If persistent pill dysphagia proceed to                            esophagram with tablet. Beverley Fiedler, MD 12/15/2022 11:20:33 AM This report has been signed electronically.

## 2022-12-15 NOTE — Progress Notes (Unsigned)
GASTROENTEROLOGY PROCEDURE H&P NOTE   Primary Care Physician: Duanne Limerick, MD    Reason for Procedure:   dysphagia  Plan:    EGD with possible dilation  Patient is appropriate for endoscopic procedure(s) in the ambulatory ([facility]) setting.  The nature of the procedure, as well as the risks, benefits, and alternatives were carefully and thoroughly reviewed with the patient. Ample time for discussion and questions allowed. The patient understood, was satisfied, and agreed to proceed.     HPI: Stephanie Mcguire is a 51 y.o. female who presents for EGD.  Medical history as below.  No recent chest pain or shortness of breath.  No abdominal pain today.  Past Medical History:  Diagnosis Date   Allergy    Anxiety    Aphthous ulcer    Arthritis    spondylosis   Asthma    Bipolar 2 disorder (HCC)    Bipolar disease, chronic (HCC)    Chest pain    Depression    DOE (dyspnea on exertion)    Elevated cholesterol 2000   GERD (gastroesophageal reflux disease)    Hyperlipidemia    Thyroid disease     Past Surgical History:  Procedure Laterality Date   BUNIONECTOMY     MANDIBLE SURGERY  12/29/2007   RECONSTRUCTION OF EYELID  1989 and 1990   UPPER GASTROINTESTINAL ENDOSCOPY      Prior to Admission medications   Medication Sig Start Date End Date Taking? Authorizing Provider  atorvastatin (LIPITOR) 20 MG tablet Take 1 tablet (20 mg total) by mouth daily. 11/26/22  Yes Duanne Limerick, MD  carbamazepine (CARBATROL) 200 MG 12 hr capsule Take 200 mg by mouth 2 (two) times daily. 08/07/22  Yes [provider]  famotidine (PEPCID) 20 MG tablet Take 1 tablet (20 mg total) by mouth daily. 10/22/22  Yes Nyoka Cowden, MD  fluticasone (FLONASE) 50 MCG/ACT nasal spray Place 2 sprays into both nostrils daily. 08/12/16  Yes Hassan Rowan, MD  lamoTRIgine (LAMICTAL) 150 MG tablet Take 300 mg by mouth at bedtime. 10/08/22  Yes [provider]  levothyroxine (SYNTHROID) 100  MCG tablet Take 1 tablet (100 mcg total) by mouth daily before breakfast. Patient taking differently: Take 100 mcg by mouth daily before breakfast. endo 01/15/22  Yes Duanne Limerick, MD  norethindrone-ethinyl estradiol (LOESTRIN) 1-20 MG-MCG tablet Take 1 tablet by mouth daily. 10/30/21  Yes Conard Novak, MD  pantoprazole (PROTONIX) 40 MG tablet Take 1 tablet (40 mg total) by mouth daily. 10/29/22  Yes Nyoka Cowden, MD  albuterol (VENTOLIN HFA) 108 (90 Base) MCG/ACT inhaler USE 2 INHALATIONS EVERY 6 HOURS AS NEEDED FOR WHEEZING OR SHORTNESS OF BREATH 02/19/22   Duanne Limerick, MD  benzonatate (TESSALON PERLES) 100 MG capsule Take 1 capsule (100 mg total) by mouth 3 (three) times daily as needed for cough. 12/15/21   Duanne Limerick, MD  carbamide peroxide (DEBROX) 6.5 % OTIC solution Place 5 drops into both ears 2 (two) times daily. 11/03/21   Duanne Limerick, MD  cetirizine-pseudoephedrine (ZYRTEC-D) 5-120 MG tablet Take 1 tablet by mouth 2 (two) times daily as needed for allergies. 08/12/16   Hassan Rowan, MD  diazepam (VALIUM) 2 MG tablet Take 2 mg by mouth as needed. 05/27/22   [provider]  gabapentin (NEURONTIN) 100 MG capsule TAKE 2 CAPSULES AT BEDTIME 06/02/22   Judi Saa, DO  LORazepam (ATIVAN) 1 MG tablet Take 1 mg by mouth daily as  needed. Dr Evelene Croon 01/31/21   [provider]  NON FORMULARY [facility] WITH MAALOX 09/22/22   [provider]    Current Outpatient Medications  Medication Sig Dispense Refill   atorvastatin (LIPITOR) 20 MG tablet Take 1 tablet (20 mg total) by mouth daily. 90 tablet 0   carbamazepine (CARBATROL) 200 MG 12 hr capsule Take 200 mg by mouth 2 (two) times daily.     famotidine (PEPCID) 20 MG tablet Take 1 tablet (20 mg total) by mouth daily. 30 tablet 3   fluticasone (FLONASE) 50 MCG/ACT nasal spray Place 2 sprays into both nostrils daily. 16 g 0   lamoTRIgine (LAMICTAL) 150 MG tablet Take 300 mg by mouth at bedtime.      levothyroxine (SYNTHROID) 100 MCG tablet Take 1 tablet (100 mcg total) by mouth daily before breakfast. (Patient taking differently: Take 100 mcg by mouth daily before breakfast. endo) 90 tablet 1   norethindrone-ethinyl estradiol (LOESTRIN) 1-20 MG-MCG tablet Take 1 tablet by mouth daily. 84 tablet 3   pantoprazole (PROTONIX) 40 MG tablet Take 1 tablet (40 mg total) by mouth daily. 30 tablet 3   albuterol (VENTOLIN HFA) 108 (90 Base) MCG/ACT inhaler USE 2 INHALATIONS EVERY 6 HOURS AS NEEDED FOR WHEEZING OR SHORTNESS OF BREATH 25.5 g 3   benzonatate (TESSALON PERLES) 100 MG capsule Take 1 capsule (100 mg total) by mouth 3 (three) times daily as needed for cough. 30 capsule 2   carbamide peroxide (DEBROX) 6.5 % OTIC solution Place 5 drops into both ears 2 (two) times daily. 15 mL 2   cetirizine-pseudoephedrine (ZYRTEC-D) 5-120 MG tablet Take 1 tablet by mouth 2 (two) times daily as needed for allergies. 30 tablet 0   diazepam (VALIUM) 2 MG tablet Take 2 mg by mouth as needed.     gabapentin (NEURONTIN) 100 MG capsule TAKE 2 CAPSULES AT BEDTIME 180 capsule 3   LORazepam (ATIVAN) 1 MG tablet Take 1 mg by mouth daily as needed. Dr Evelene Croon     NON FORMULARY [facility] WITH MAALOX     Current Facility-Administered Medications  Medication Dose Route Frequency Provider Last Rate Last Admin   0.9 %  sodium chloride infusion  500 mL Intravenous Once Alyviah Crandle, Carie Caddy, MD        Allergies as of 12/15/2022 - Review Complete 12/15/2022  Allergen Reaction Noted   Gluten meal  11/12/2022    Family History  Problem Relation Age of Onset   Parkinson's disease Mother    Hypertension Father    Stroke Maternal Grandmother    Diabetes Paternal Grandfather    Heart disease Paternal Grandfather    Liver cancer Neg Hx    Colon cancer Neg Hx    Esophageal cancer Neg Hx    Rectal cancer Neg Hx    Stomach cancer Neg Hx     Social History   Socioeconomic History   Marital status: Married    Spouse name: Not on file    Number of children: 2   Years of education: Not on file   Highest education level: Not on file  Occupational History   Occupation: homemaker  Tobacco Use   Smoking status: Never   Smokeless tobacco: Never  Vaping Use   Vaping Use: Never used  Substance and Sexual Activity   Alcohol use: Yes    Alcohol/week: 2.0 standard drinks of alcohol    Types: 2 Glasses of wine per week    Comment: rarely   Drug use: No  Sexual activity: Yes    Birth control/protection: Pill  Other Topics Concern   Not on file  Social History Narrative   Not on file   Social Determinants of Health   Financial Resource Strain: Not on file  Food Insecurity: Not on file  Transportation Needs: Not on file  Physical Activity: Insufficiently Active (12/26/2018)   Exercise Vital Sign    Days of Exercise per Week: 3 days    Minutes of Exercise per Session: 30 min  Stress: Not on file  Social Connections: Not on file  Intimate Partner Violence: Not on file    Physical Exam: Vital signs in last 24 hours: @BP  125/87   Pulse 82   Temp 98.4 F (36.9 C) (Temporal)   Ht 5\' 5"  (1.651 m)   Wt 141 lb (64 kg)   SpO2 98%   BMI 23.46 kg/m  GEN: NAD EYE: Sclerae anicteric ENT: MMM CV: Non-tachycardic Pulm: CTA b/l GI: Soft, NT/ND NEURO:  Alert & Oriented x 3   , MD [facility]  12/15/2022 10:46 AM

## 2022-12-15 NOTE — Progress Notes (Unsigned)
Called to room to assist during endoscopic procedure.  Patient ID and intended procedure confirmed with present staff. Received instructions for my participation in the procedure from the performing physician.  

## 2022-12-16 ENCOUNTER — Telehealth: Payer: Self-pay

## 2022-12-16 ENCOUNTER — Other Ambulatory Visit: Payer: Self-pay

## 2022-12-16 DIAGNOSIS — R1310 Dysphagia, unspecified: Secondary | ICD-10-CM

## 2022-12-16 NOTE — Telephone Encounter (Signed)
Dr Pyrtle-please advise.... 

## 2022-12-16 NOTE — Telephone Encounter (Signed)
No answer, left message to call if having any issues or concerns, B.Khianna Blazina RN 

## 2022-12-16 NOTE — Telephone Encounter (Signed)
Spoke with pt and he is aware of Dr. Dorita Sciara recommendations. Order entered and rad schedling will contact pt to schedule appt. Pt knows she will get a call from Rad scheduling.

## 2022-12-16 NOTE — Telephone Encounter (Signed)
Patient returned call stating that she took her pills last night and a pill got stuck again and is wanting to know if that should be still happening. Please advise.

## 2022-12-16 NOTE — Telephone Encounter (Signed)
Would have hoped this would be better, perhaps a bit too early Linda, pls arrange barium esophagram with tablet in about 2 weeks, instruct pt that if symptoms abate before the test she can cancel the test, otherwise proceed JMP

## 2022-12-18 ENCOUNTER — Encounter: Payer: Self-pay | Admitting: Internal Medicine

## 2022-12-18 NOTE — Telephone Encounter (Signed)
Barium esophagram scheduled for 01-01-23 at 11 am.

## 2022-12-18 NOTE — Telephone Encounter (Signed)
Patient called, wanted to schedule appointment with radiology today. Stated she would be out of town next week. Advised patient the order was put in and radiology would call her to schedule.

## 2022-12-23 ENCOUNTER — Encounter: Payer: Self-pay | Admitting: Internal Medicine

## 2022-12-24 NOTE — Progress Notes (Deleted)
  [name] [address] [place] [address] Phone: [phone] Subjective:    I'm seeing this patient by the request  of:  Duanne Limerick, MD  CC:   [id number]  VERNETTA DIZDAREVIC is a 51 y.o. female coming in with complaint of back and neck pain. OMT 11/18/2022. Patient states   Medications patient has been prescribed: None  Taking:         Reviewed prior external information including notes and imaging from previsou exam, outside providers and external EMR if available.   As well as notes that were available from care everywhere and other healthcare systems.  Past medical history, social, surgical and family history all reviewed in electronic medical record.  No pertanent information unless stated regarding to the chief complaint.   Past Medical History:  Diagnosis Date   Allergy    Anxiety    Aphthous ulcer    Arthritis    spondylosis   Asthma    Bipolar 2 disorder (HCC)    Bipolar disease, chronic (HCC)    Chest pain    Depression    DOE (dyspnea on exertion)    Elevated cholesterol 2000   GERD (gastroesophageal reflux disease)    Hyperlipidemia    Thyroid disease     Allergies  Allergen Reactions   Gluten Meal     Other Reaction(s): Other (See Comments)  Headache GI upset     Review of Systems:  No headache, visual changes, nausea, vomiting, diarrhea, constipation, dizziness, abdominal pain, skin rash, fevers, chills, night sweats, weight loss, swollen lymph nodes, body aches, joint swelling, chest pain, shortness of breath, mood changes. POSITIVE muscle aches  Objective  There were no vitals taken for this visit.   General: No apparent distress alert and oriented x3 mood and affect normal, dressed appropriately.  HEENT: Pupils equal, extraocular movements intact  Respiratory: Patient's speak in full sentences and does not appear short of breath  Cardiovascular: No lower extremity edema, non tender, no  erythema  Gait MSK:  Back   Osteopathic findings  C2 flexed rotated and side bent right C6 flexed rotated and side bent left T3 extended rotated and side bent right inhaled rib T9 extended rotated and side bent left L2 flexed rotated and side bent right Sacrum right on right       Assessment and Plan:  No problem-specific Assessment & Plan notes found for this encounter.    Nonallopathic problems  Decision today to treat with OMT was based on Physical Exam  After verbal consent patient was treated with HVLA, ME, FPR techniques in cervical, rib, thoracic, lumbar, and sacral  areas  Patient tolerated the procedure well with improvement in symptoms  Patient given exercises, stretches and lifestyle modifications  See medications in patient instructions if given  Patient will follow up in 4-8 weeks             Note: This dictation was prepared with Dragon dictation along with smaller phrase technology. Any transcriptional errors that result from this process are unintentional.

## 2022-12-30 ENCOUNTER — Ambulatory Visit: Payer: BC Managed Care – PPO | Admitting: Family Medicine

## 2023-01-01 ENCOUNTER — Other Ambulatory Visit (HOSPITAL_COMMUNITY): Payer: BC Managed Care – PPO

## 2023-01-15 ENCOUNTER — Encounter: Payer: Self-pay | Admitting: Internal Medicine

## 2023-01-15 ENCOUNTER — Ambulatory Visit (HOSPITAL_COMMUNITY)
Admission: RE | Admit: 2023-01-15 | Discharge: 2023-01-15 | Disposition: A | Discharge status: Home / Self Care | Payer: BC Managed Care – PPO | Source: Ambulatory Visit | Attending: Internal Medicine | Admitting: Internal Medicine

## 2023-01-15 DIAGNOSIS — R1310 Dysphagia, unspecified: Secondary | ICD-10-CM | POA: Diagnosis not present

## 2023-01-15 DIAGNOSIS — K224 Dyskinesia of esophagus: Secondary | ICD-10-CM | POA: Diagnosis not present

## 2023-01-15 DIAGNOSIS — K219 Gastro-esophageal reflux disease without esophagitis: Secondary | ICD-10-CM | POA: Diagnosis not present

## 2023-01-19 ENCOUNTER — Other Ambulatory Visit: Payer: Self-pay

## 2023-01-19 ENCOUNTER — Telehealth: Payer: Self-pay

## 2023-01-19 DIAGNOSIS — R1310 Dysphagia, unspecified: Secondary | ICD-10-CM

## 2023-01-19 NOTE — Telephone Encounter (Signed)
Referral entered for speech therapy for abnormal barium esophagram, pill dysphagia, oropharyngeal dysphagia eval and treat.

## 2023-01-21 ENCOUNTER — Other Ambulatory Visit: Payer: Self-pay | Admitting: Internal Medicine

## 2023-01-21 ENCOUNTER — Encounter: Payer: Self-pay | Admitting: Internal Medicine

## 2023-01-21 NOTE — Progress Notes (Signed)
[place] Phone: [phone] Subjective:   Fontaine No, am serving as a scribe for Dr. Hulan Saas.  I'm seeing this patient by the request  of:  Juline Patch, MD  CC: back and neck pain   [id number]  Stephanie Mcguire is a 52 y.o. female coming in with complaint of back and neck pain. OMT on 11/18/2022. Patient states that she is the same as last visit.   Medications patient has been prescribed: None  Taking:         Reviewed prior external information including notes and imaging from previsou exam, outside providers and external EMR if available.   As well as notes that were available from care everywhere and other healthcare systems.  Past medical history, social, surgical and family history all reviewed in electronic medical record.  No pertanent information unless stated regarding to the chief complaint.   Past Medical History:  Diagnosis Date   Allergy    Anxiety    Aphthous ulcer    Arthritis    spondylosis   Asthma    Bipolar 2 disorder (HCC)    Bipolar disease, chronic (HCC)    Chest pain    Depression    DOE (dyspnea on exertion)    Elevated cholesterol 2000   GERD (gastroesophageal reflux disease)    Hyperlipidemia    Thyroid disease     Allergies  Allergen Reactions   Gluten Meal     Other Reaction(s): Other (See Comments)  Headache GI upset     Review of Systems:  No headache, visual changes, nausea, vomiting, diarrhea, constipation, dizziness, abdominal pain, skin rash, fevers, chills, night sweats, weight loss, swollen lymph nodes, body aches, joint swelling, chest pain, shortness of breath, mood changes. POSITIVE muscle aches  Objective  Blood pressure 110/80, pulse 79, height 5\' 5"  (1.651 m), weight 144 lb (65.3 kg), SpO2 98 %.   General: No apparent distress alert and oriented x3 mood and affect normal, dressed appropriately.  HEENT: Pupils equal,  extraocular movements intact  Respiratory: Patient's speak in full sentences and does not appear short of breath  Cardiovascular: No lower extremity edema, non tender, no erythema  Low back exam does have some loss lordosis.  Some tenderness to palpation of the paraspinal musculature.  Tightness with FABER.  Osteopathic findings  C7 flexed rotated and side bent right  T3 extended rotated and side bent right inhaled rib T5 extended rotated and side bent left L3 flexed rotated and side bent right Sacrum right on right     Assessment and Plan:  Scapular dyskinesis Continues to have some scapular dyskinesis.  Will continue to monitor the lumbar area as well.  Discussed icing regimen and home exercises follow-up with me again in 6 to 8 weeks patient does have gabapentin but not taking it regularly increase activity slowly.  Follow-up again in 6 to 8 weeks    Nonallopathic problems  Decision today to treat with OMT was based on Physical Exam  After verbal consent patient was treated with HVLA, ME, FPR techniques in cervical, rib, thoracic, lumbar, and sacral  areas  Patient tolerated the procedure well with improvement in symptoms  Patient given exercises, stretches and lifestyle modifications  See medications in patient instructions if given  Patient will follow up in 4-8 weeks     The above documentation has been reviewed and is accurate and complete Lyndal Pulley, DO  Note: This dictation was prepared with Dragon dictation along with smaller phrase technology. Any transcriptional errors that result from this process are unintentional.

## 2023-01-25 ENCOUNTER — Ambulatory Visit (INDEPENDENT_AMBULATORY_CARE_PROVIDER_SITE_OTHER): Payer: BC Managed Care – PPO | Admitting: Family Medicine

## 2023-01-25 VITALS — BP 110/80 | HR 79 | Ht 65.0 in | Wt 144.0 lb

## 2023-01-25 DIAGNOSIS — M9904 Segmental and somatic dysfunction of sacral region: Secondary | ICD-10-CM

## 2023-01-25 DIAGNOSIS — M9902 Segmental and somatic dysfunction of thoracic region: Secondary | ICD-10-CM

## 2023-01-25 DIAGNOSIS — M9901 Segmental and somatic dysfunction of cervical region: Secondary | ICD-10-CM | POA: Diagnosis not present

## 2023-01-25 DIAGNOSIS — M9908 Segmental and somatic dysfunction of rib cage: Secondary | ICD-10-CM | POA: Diagnosis not present

## 2023-01-25 DIAGNOSIS — M9903 Segmental and somatic dysfunction of lumbar region: Secondary | ICD-10-CM | POA: Diagnosis not present

## 2023-01-25 DIAGNOSIS — G2589 Other specified extrapyramidal and movement disorders: Secondary | ICD-10-CM

## 2023-01-25 NOTE — Patient Instructions (Signed)
I love the cow Get back onto your routine See you again in 2 months

## 2023-01-25 NOTE — Assessment & Plan Note (Signed)
Continues to have some scapular dyskinesis.  Will continue to monitor the lumbar area as well.  Discussed icing regimen and home exercises follow-up with me again in 6 to 8 weeks patient does have gabapentin but not taking it regularly increase activity slowly.  Follow-up again in 6 to 8 weeks

## 2023-02-08 ENCOUNTER — Other Ambulatory Visit: Payer: Self-pay | Admitting: Family Medicine

## 2023-02-08 DIAGNOSIS — E785 Hyperlipidemia, unspecified: Secondary | ICD-10-CM

## 2023-02-08 NOTE — Telephone Encounter (Signed)
Requested Prescriptions  Pending Prescriptions Disp Refills   atorvastatin (LIPITOR) 20 MG tablet [Pharmacy Med Name: ATORVASTATIN TABS 20MG] 90 tablet 0    Sig: TAKE 1 TABLET DAILY     Cardiovascular:  Antilipid - Statins Failed - 02/08/2023 12:12 AM      Failed - Lipid Panel in normal range within the last 12 months    Cholesterol, Total  Date Value Ref Range Status  11/13/2022 254 (H) 100 - 199 mg/dL Final   LDL Chol Calc (NIH)  Date Value Ref Range Status  11/13/2022 150 (H) 0 - 99 mg/dL Final   HDL  Date Value Ref Range Status  11/13/2022 94 >39 mg/dL Final   Triglycerides  Date Value Ref Range Status  11/13/2022 64 0 - 149 mg/dL Final         Passed - Patient is not pregnant      Passed - Valid encounter within last 12 months    Recent Outpatient Visits           2 months ago Hyperlipidemia, unspecified hyperlipidemia type   [facility] [place], [place], MD   9 months ago Hyperlipidemia, unspecified hyperlipidemia type   [facility] [place], Deanna C, MD   1 year ago Aphthous ulcer   [facility] [place], [place], MD   1 year ago Chest pain, unspecified type   [facility] [place], Deanna C, MD   1 year ago Dysfunction of both eustachian tubes   [facility] [place] [facility] [place], [place], MD       Future Appointments             In 1 month Lyndal Pulley, DO [facility] at [facility]   In 3 months Juline Patch, MD [place] at [facility] [place], [facility]

## 2023-02-10 ENCOUNTER — Other Ambulatory Visit (HOSPITAL_COMMUNITY)
Admission: RE | Admit: 2023-02-10 | Discharge: 2023-02-10 | Disposition: A | Discharge status: Home / Self Care | Payer: BC Managed Care – PPO | Source: Ambulatory Visit | Attending: Licensed Practical Nurse | Admitting: Licensed Practical Nurse

## 2023-02-10 ENCOUNTER — Ambulatory Visit (INDEPENDENT_AMBULATORY_CARE_PROVIDER_SITE_OTHER): Payer: BC Managed Care – PPO | Admitting: Licensed Practical Nurse

## 2023-02-10 VITALS — BP 121/93 | HR 91 | Ht 65.0 in | Wt 142.4 lb

## 2023-02-10 DIAGNOSIS — Z124 Encounter for screening for malignant neoplasm of cervix: Secondary | ICD-10-CM | POA: Insufficient documentation

## 2023-02-10 DIAGNOSIS — Z01419 Encounter for gynecological examination (general) (routine) without abnormal findings: Secondary | ICD-10-CM

## 2023-02-10 DIAGNOSIS — Z3041 Encounter for surveillance of contraceptive pills: Secondary | ICD-10-CM

## 2023-02-10 MED ORDER — NORETHINDRONE ACET-ETHINYL EST 1-20 MG-MCG PO TABS: 1.0000 | ORAL_TABLET | Freq: Every day | ORAL | 0 refills | Status: DC

## 2023-02-10 MED ORDER — NORETHINDRONE ACET-ETHINYL EST 1-20 MG-MCG PO TABS: 1.0000 | ORAL_TABLET | Freq: Every day | ORAL | 3 refills | Status: DC

## 2023-02-10 NOTE — Progress Notes (Signed)
Gynecology Annual Exam  PCP: Juline Patch, MD  Chief Complaint:  Chief Complaint  Patient presents with   Gynecologic Exam    History of Present Illness:Patient is a 52 y.o. G0P0000 presents for annual exam. The patient has no complaints today. She would like a 1 month supply of her OCP sent to [facility] and then 90 day supply to surescripts.    LMP: Patient's last menstrual period was 01/11/2023 (approximate). Has breast tenderness and moodiness and Bleeds lightly during the Placebo week of pills    The patient is not sexually active (this is decision she and her husband have made, she is comfortable with this). She denies dyspareunia.  The patient does perform self breast exams.  There is no notable family history of breast or ovarian cancer in her family.  The patient wears seatbelts: yes.   The patient has regular exercise:  walks her dog but admits she does not get regular exercise .    The patient reports current symptoms of depression. Dx'd with Bipolar 2   Currently on medication, has a provider  Is a homemaker, her son is currently home d/t a motorcycle accident so she is tending to him, does a lot of volunteer work, she has " a lot of animals" at her house that she cares for.  Lives with her husband and daughter, feels safe    Has a PCP, last seen in November-hx high cholesterol Hx of hypothyroidism has an endocrinologist  Wears glasses, last eye exam last Spring Last Dental exam last month   Review of Systems: Review of Systems  Constitutional: Negative.   HENT:         Sneezing   Eyes: Negative.   Respiratory: Negative.    Cardiovascular: Negative.   Gastrointestinal: Negative.   Genitourinary: Negative.   Musculoskeletal: Negative.   Endo/Heme/Allergies:        Hot flashes at night time, they are tolerable   Psychiatric/Behavioral:  Positive for depression. The patient is nervous/anxious.   Not sure when her mother went through menopause, her sister is  a few years older than her and is perimenopausal   Past Medical History:  Patient Active Problem List   Diagnosis Date Noted   Scapular dyskinesis 05/28/2022   Somatic dysfunction of spine, cervical 05/28/2022   Right carpal tunnel syndrome 12/17/2021   Right elbow pain 11/12/2021   Lumbar radiculopathy 10/17/2020   Patellofemoral arthritis of right knee 10/01/2020    Seen on ultrasound 2021 injection given October 01, 2020 viscosupplementation given December 04, 2020    Acute medial meniscal tear, right, initial encounter 08/02/2020   Interstitial lung disease ([place]) 10/14/2015   DOE (dyspnea on exertion) 10/14/2015    Onset with covid 12/25/20 assoc with atypical cp/ chest tightness not resolved on breztri -  01/23/22   Walked on RA  x  3  lap(s) =  approx 525  ft  @ fast pace, stopped due to end of study with lowest 02 sats 99%   - 01/23/2022 max gerd rx  > improved 02/26/2022  - 02/26/2022  After extensive coaching inhaler device,  effectiveness =    90%> continue breztri x one week at 2 bid and then stop to see if breathing or changes in sob or cough  > not better  - 03/12/22   HRCT:  No ILD - min peripheral  scarring  - CPST  5/23/ 23   wnl for age      Congenital  hypothyroidism without goiter 10/14/2015   Abnormal chest CT 10/14/2015   Essential vulvodynia 12/10/2011    Past Surgical History:  Past Surgical History:  Procedure Laterality Date   BUNIONECTOMY     MANDIBLE SURGERY  12/29/2007   RECONSTRUCTION OF EYELID  1989 and 1990   UPPER GASTROINTESTINAL ENDOSCOPY      Gynecologic History:  Patient's last menstrual period was 01/11/2023 (approximate). Last Pap: Results were: 2019 no abnormalities  Last mammogram: 08/2022 Results were:  results not available   Obstetric History: G0P0000  Family History:  Family History  Problem Relation Age of Onset   Parkinson's disease Mother    Hypertension Father    Stroke Maternal Grandmother    Diabetes Paternal Grandfather     Heart disease Paternal Grandfather    Liver cancer Neg Hx    Colon cancer Neg Hx    Esophageal cancer Neg Hx    Rectal cancer Neg Hx    Stomach cancer Neg Hx     Social History:  Social History   Socioeconomic History   Marital status: Married    Spouse name: Not on file   Number of children: 2   Years of education: Not on file   Highest education level: Not on file  Occupational History   Occupation: homemaker  Tobacco Use   Smoking status: Never   Smokeless tobacco: Never  Vaping Use   Vaping Use: Never used  Substance and Sexual Activity   Alcohol use: Yes    Alcohol/week: 2.0 standard drinks of alcohol    Types: 2 Glasses of wine per week    Comment: rarely   Drug use: No   Sexual activity: Yes    Birth control/protection: Pill  Other Topics Concern   Not on file  Social History Narrative   Not on file   Social Determinants of Health   Financial Resource Strain: Not on file  Food Insecurity: Not on file  Transportation Needs: Not on file  Physical Activity: Insufficiently Active (12/26/2018)   Exercise Vital Sign    Days of Exercise per Week: 3 days    Minutes of Exercise per Session: 30 min  Stress: Not on file  Social Connections: Not on file  Intimate Partner Violence: Not on file    Allergies:  Allergies  Allergen Reactions   Gluten Meal     Other Reaction(s): Other (See Comments)  Headache GI upset    Medications: Prior to Admission medications   Medication Sig Start Date End Date Taking? Authorizing Provider  albuterol (VENTOLIN HFA) 108 (90 Base) MCG/ACT inhaler USE 2 INHALATIONS EVERY 6 HOURS AS NEEDED FOR WHEEZING OR SHORTNESS OF BREATH 02/19/22  Yes Juline Patch, MD  atorvastatin (LIPITOR) 20 MG tablet TAKE 1 TABLET DAILY 02/08/23  Yes Juline Patch, MD  benzonatate (TESSALON PERLES) 100 MG capsule Take 1 capsule (100 mg total) by mouth 3 (three) times daily as needed for cough. 12/15/21  Yes Juline Patch, MD  carbamazepine  (CARBATROL) 200 MG 12 hr capsule Take 200 mg by mouth 2 (two) times daily. 08/07/22  Yes [provider]  carbamide peroxide (DEBROX) 6.5 % OTIC solution Place 5 drops into both ears 2 (two) times daily. 11/03/21  Yes Juline Patch, MD  cetirizine-pseudoephedrine (ZYRTEC-D) 5-120 MG tablet Take 1 tablet by mouth 2 (two) times daily as needed for allergies. 08/12/16  Yes Frederich Cha, MD  diazepam (VALIUM) 2 MG tablet Take 2 mg by mouth as needed. 05/27/22  Yes  [provider]  famotidine (PEPCID) 20 MG tablet Take 1 tablet (20 mg total) by mouth daily. 10/22/22  Yes Tanda Rockers, MD  fluticasone (FLONASE) 50 MCG/ACT nasal spray Place 2 sprays into both nostrils daily. 08/12/16  Yes Frederich Cha, MD  gabapentin (NEURONTIN) 100 MG capsule TAKE 2 CAPSULES AT BEDTIME 06/02/22  Yes Lyndal Pulley, DO  lamoTRIgine (LAMICTAL) 150 MG tablet Take 300 mg by mouth at bedtime. 10/08/22  Yes [provider]  NON FORMULARY [facility] WITH MAALOX 09/22/22  Yes [provider]  norethindrone-ethinyl estradiol (LOESTRIN 1/20, 21,) 1-20 MG-MCG tablet Take 1 tablet by mouth daily. 02/10/23  Yes Merly Hinkson, Nunzio Cobbs, CNM  pantoprazole (PROTONIX) 40 MG tablet Take 1 tablet (40 mg total) by mouth daily. 10/29/22  Yes Tanda Rockers, MD  norethindrone-ethinyl estradiol (LOESTRIN) 1-20 MG-MCG tablet Take 1 tablet by mouth daily. 03/11/23   DominicNunzio Cobbs, CNM    Physical Exam Vitals: Blood pressure (!) 121/93, pulse 91, height 5' 5"$  (1.651 m), weight 142 lb 6.4 oz (64.6 kg), last menstrual period 01/11/2023.  General: NAD HEENT: normocephalic, anicteric Thyroid: no enlargement, no palpable nodules Pulmonary: No increased work of breathing, CTAB Cardiovascular: RRR, distal pulses 2+ Breast: Breast symmetrical, no tenderness, no palpable nodules or masses, no skin or nipple retraction present, no nipple discharge.  No axillary or supraclavicular lymphadenopathy. Abdomen: NABS, soft,  non-tender, non-distended.  Umbilicus without lesions.  No hepatomegaly, splenomegaly or masses palpable. No evidence of hernia  Genitourinary:  External: Normal external female genitalia.  Normal urethral meatus, normal Bartholin's and Skene's glands.    Vagina: Normal vaginal mucosa, no evidence of prolapse.    Cervix: Grossly normal in appearance, no bleeding  Uterus: Bimanual exam declined,   Adnexa:   Rectal: deferred  Lymphatic: no evidence of inguinal lymphadenopathy Extremities: no edema, erythema, or tenderness Neurologic: Grossly intact Psychiatric: mood appropriate, affect full  Pt tearful with speculum exam, especially once speculum was opened within the vagina. Asked pt if there is any way to make the exam less traumatic, pt not able to express any thoughts at this time. Pt seemed less tearful at the end of the visit.     Assessment: 52 y.o. G0P0000 routine annual exam  Plan: Problem List Items Addressed This Visit   None Visit Diagnoses     Well woman exam    -  Primary   Relevant Medications   norethindrone-ethinyl estradiol (LOESTRIN) 1-20 MG-MCG tablet (Start on 03/11/2023)   norethindrone-ethinyl estradiol (LOESTRIN 1/20, 21,) 1-20 MG-MCG tablet   Other Relevant Orders   Cytology - PAP   Cervical cancer screening       Relevant Orders   Cytology - PAP   Well woman exam with routine gynecological exam       Encounter for surveillance of contraceptive pills       Relevant Medications   norethindrone-ethinyl estradiol (LOESTRIN) 1-20 MG-MCG tablet (Start on 03/11/2023)   norethindrone-ethinyl estradiol (LOESTRIN 1/20, 21,) 1-20 MG-MCG tablet       1) Mammogram - recommend yearly screening mammogram.  Mammogram Is up to date  2) STI screening  wasoffered and declined  3) ASCCP guidelines and rational discussed.  Patient opts for every 5 years screening interval  4) Osteoporosis  -  Consider FDA-approved medical therapies in postmenopausal women and men  aged 79 years and older, based on the following: a) A hip or vertebral (clinical or morphometric) fracture b) T-score ? -2.5 at the femoral neck  or spine after appropriate evaluation to exclude secondary causes C) Low bone mass (T-score between -1.0 and -2.5 at the femoral neck or spine) and a 10-year probability of a hip fracture ? 3% or a 10-year probability of a major osteoporosis-related fracture ? 20% based on the [place]-adapted WHO algorithm   5) Routine healthcare maintenance including cholesterol, diabetes screening discussed managed by PCP  6) Colonoscopy UP to date .  Screening recommended starting at age 86 for average risk individuals, age 55 for individuals deemed at increased risk (including African Americans) and recommended to continue until age 28.  For patient age 56-85 individualized approach is recommended.  Gold standard screening is via colonoscopy, Cologuard screening is an acceptable alternative for patient unwilling or unable to undergo colonoscopy.  "Colorectal cancer screening for average?risk adults: 2018 guideline update from the American Cancer Society"CA: A Cancer Journal for Clinicians: May 26, 2017   7) RTC in 1 year for annual exam, reviewed a pelvic exam is not necessary at that time, unless she has any concerns that would require an exam. Also, she may have her partner, or another person she is comfortable with at any time a pelvic exam is required.   8) reviewed symptoms of menopause, pt may return if the experiences any unsettling symptoms that she  would like to discuss.    Roberto Scales, [place], [facility] 02/10/2023, 2:01 PM

## 2023-02-12 LAB — CYTOLOGY - PAP
Adequacy: ABSENT
Comment: NEGATIVE
Diagnosis: NEGATIVE
High risk HPV: NEGATIVE

## 2023-02-15 ENCOUNTER — Other Ambulatory Visit: Payer: Self-pay | Admitting: Internal Medicine

## 2023-03-18 NOTE — Progress Notes (Signed)
[place] Phone: [phone] Subjective:   Fontaine No, am serving as a scribe for Dr. Hulan Saas.  I'm seeing this patient by the request  of:  Juline Patch, MD  CC: back and neck pain   [id number]  SEGEN TITUS is a 52 y.o. female coming in with complaint of back and neck pain. OMT 01/25/2023. Patient states that she is doing relatively well but does have some tightness.  Nothing that is stopping her from activity.  Describes the pain as a dull, throbbing aching sensation.  Medications patient has been prescribed: None  Taking:       Reviewed prior external information including notes and imaging from previsou exam, outside providers and external EMR if available.   As well as notes that were available from care everywhere and other healthcare systems.  Past medical history, social, surgical and family history all reviewed in electronic medical record.  No pertanent information unless stated regarding to the chief complaint.   Past Medical History:  Diagnosis Date   Allergy    Anxiety    Aphthous ulcer    Arthritis    spondylosis   Asthma    Bipolar 2 disorder (HCC)    Bipolar disease, chronic (HCC)    Chest pain    Depression    DOE (dyspnea on exertion)    Elevated cholesterol 2000   GERD (gastroesophageal reflux disease)    Hyperlipidemia    Thyroid disease     Allergies  Allergen Reactions   Gluten Meal     Other Reaction(s): Other (See Comments)  Headache GI upset     Review of Systems:  No headache, visual changes, nausea, vomiting, diarrhea, constipation, dizziness, abdominal pain, skin rash, fevers, chills, night sweats, weight loss, swollen lymph nodes, body aches, joint swelling, chest pain, shortness of breath, mood changes. POSITIVE muscle aches  Objective  Blood pressure 112/84, pulse 86, height 5\' 5"  (1.651 m), weight 145 lb (65.8 kg), SpO2 96 %.    General: No apparent distress alert and oriented x3 mood and affect normal, dressed appropriately.  HEENT: Pupils equal, extraocular movements intact  Respiratory: Patient's speak in full sentences and does not appear short of breath  Cardiovascular: No lower extremity edema, non tender, no erythema  MSK:  Back low back does have tightness more on the left side of the paraspinal musculature.  Osteopathic findings  C2 flexed rotated and side bent right C5 flexed rotated and side bent left T3 extended rotated and side bent right inhaled rib T8 extended rotated and side bent left L2 flexed rotated and side bent left L5 flexed rotated and side bent left Sacrum right on right     Assessment and Plan:  Lumbar radiculopathy No radicular symptoms at this time, did have some increasing discomfort in the paraspinal musculature on the left side.  Significant tightness in the neck but I do think it is multifactorial with patient also having some stress.  The patient taking more time for herself.  Discussed icing regimen and home exercises.  Increase activity slowly.  Follow-up again in 6 to 8 weeks    Nonallopathic problems  Decision today to treat with OMT was based on Physical Exam  After verbal consent patient was treated with HVLA, ME, FPR techniques in cervical, rib, thoracic, lumbar, and sacral  areas  Patient tolerated the procedure well with improvement in symptoms  Patient given exercises, stretches and lifestyle  modifications  See medications in patient instructions if given  Patient will follow up in 6-8 weeks    The above documentation has been reviewed and is accurate and complete Lyndal Pulley, DO       Note: This dictation was prepared with Dragon dictation along with smaller phrase technology. Any transcriptional errors that result from this process are unintentional.

## 2023-03-22 ENCOUNTER — Ambulatory Visit: Payer: BC Managed Care – PPO | Admitting: Family Medicine

## 2023-03-24 DIAGNOSIS — L300 Nummular dermatitis: Secondary | ICD-10-CM | POA: Diagnosis not present

## 2023-03-30 ENCOUNTER — Ambulatory Visit (INDEPENDENT_AMBULATORY_CARE_PROVIDER_SITE_OTHER): Payer: BC Managed Care – PPO | Admitting: Family Medicine

## 2023-03-30 ENCOUNTER — Encounter: Payer: Self-pay | Admitting: Family Medicine

## 2023-03-30 VITALS — BP 112/84 | HR 86 | Ht 65.0 in | Wt 145.0 lb

## 2023-03-30 DIAGNOSIS — M5416 Radiculopathy, lumbar region: Secondary | ICD-10-CM | POA: Diagnosis not present

## 2023-03-30 DIAGNOSIS — M9904 Segmental and somatic dysfunction of sacral region: Secondary | ICD-10-CM | POA: Diagnosis not present

## 2023-03-30 DIAGNOSIS — M9908 Segmental and somatic dysfunction of rib cage: Secondary | ICD-10-CM | POA: Diagnosis not present

## 2023-03-30 DIAGNOSIS — M9903 Segmental and somatic dysfunction of lumbar region: Secondary | ICD-10-CM | POA: Diagnosis not present

## 2023-03-30 DIAGNOSIS — M9902 Segmental and somatic dysfunction of thoracic region: Secondary | ICD-10-CM | POA: Diagnosis not present

## 2023-03-30 DIAGNOSIS — M9901 Segmental and somatic dysfunction of cervical region: Secondary | ICD-10-CM | POA: Diagnosis not present

## 2023-03-30 NOTE — Patient Instructions (Signed)
Good to see you Take time for yourself See me again in 6-8 weeks

## 2023-03-30 NOTE — Assessment & Plan Note (Signed)
No radicular symptoms at this time, did have some increasing discomfort in the paraspinal musculature on the left side.  Significant tightness in the neck but I do think it is multifactorial with patient also having some stress.  The patient taking more time for herself.  Discussed icing regimen and home exercises.  Increase activity slowly.  Follow-up again in 6 to 8 weeks

## 2023-05-06 NOTE — Progress Notes (Signed)
[facility] [address] [place] [address] Phone: [phone] Subjective:   [name], am serving as a scribe for Dr. Antoine Primas.  I'm seeing this patient by the request  of:  Duanne Limerick, MD  CC: Back and neck pain follow-up  [id number]  Stephanie Mcguire is a 52 y.o. female coming in with complaint of back and neck pain. OMT 03/30/2023. Patient states doing well. Same per usual. No new concerns.  Medications patient has been prescribed: Gabapentin  Taking:         Reviewed prior external information including notes and imaging from previsou exam, outside providers and external EMR if available.   As well as notes that were available from care everywhere and other healthcare systems.  Past medical history, social, surgical and family history all reviewed in electronic medical record.  No pertanent information unless stated regarding to the chief complaint.   Past Medical History:  Diagnosis Date   Allergy    Anxiety    Aphthous ulcer    Arthritis    spondylosis   Asthma    Bipolar 2 disorder (HCC)    Bipolar disease, chronic (HCC)    Chest pain    Depression    DOE (dyspnea on exertion)    Elevated cholesterol 2000   GERD (gastroesophageal reflux disease)    Hyperlipidemia    Thyroid disease     Allergies  Allergen Reactions   Gluten Meal     Other Reaction(s): Other (See Comments)  Headache GI upset     Review of Systems:  No headache, visual changes, nausea, vomiting, diarrhea, constipation, dizziness, abdominal pain, skin rash, fevers, chills, night sweats, weight loss, swollen lymph nodes, body aches, joint swelling, chest pain, shortness of breath, mood changes. POSITIVE muscle aches  Objective  Blood pressure 122/82, pulse 93, height 5\' 5"  (1.651 m), weight 146 lb (66.2 kg), SpO2 97 %.   General: No apparent distress alert and oriented x3 mood and affect normal, dressed appropriately.  HEENT:  Pupils equal, extraocular movements intact  Respiratory: Patient's speak in full sentences and does not appear short of breath  Cardiovascular: No lower extremity edema, non tender, no erythema  Event does have some loss of lordosis noted.  Some tenderness to palpation in the paraspinal musculature.  Tightness with Pearlean Brownie right greater than left.  Osteopathic findings  C2 flexed rotated and side bent right C6 flexed rotated and side bent left T3 extended rotated and side bent right inhaled rib T9 extended rotated and side bent left T11 flexed rotated and side bent right L1 flexed rotated and side bent right L2 flexed rotated and side bent left Sacrum right on right       Assessment and Plan:  Scapular dyskinesis Scapular dyskinesis noted.  Discussed icing regimen and home exercises, discussed which activities to do and which ones to avoid, increase activity slowly.  Have some increase in anxiety secondary to daughter graduating.  Follow-up again in 6 to 8 weeks    Nonallopathic problems  Decision today to treat with OMT was based on Physical Exam  After verbal consent patient was treated with HVLA, ME, FPR techniques in cervical, rib, thoracic, lumbar, and sacral  areas  Patient tolerated the procedure well with improvement in symptoms  Patient given exercises, stretches and lifestyle modifications  See medications in patient instructions if given  Patient will follow up in 4-8 weeks      The above documentation has  been reviewed and is accurate and complete Judi SaaZachary M Aria Jarrard, DO        Note: This dictation was prepared with Dragon dictation along with smaller phrase technology. Any transcriptional errors that result from this process are unintentional.

## 2023-05-10 ENCOUNTER — Other Ambulatory Visit: Payer: Self-pay | Admitting: Family Medicine

## 2023-05-10 DIAGNOSIS — E785 Hyperlipidemia, unspecified: Secondary | ICD-10-CM

## 2023-05-10 NOTE — Telephone Encounter (Signed)
Requested Prescriptions  Pending Prescriptions Disp Refills   atorvastatin (LIPITOR) 20 MG tablet [Pharmacy Med Name: ATORVASTATIN TABS 20MG ] 90 tablet 2    Sig: TAKE 1 TABLET DAILY     Cardiovascular:  Antilipid - Statins Failed - 05/10/2023 12:31 AM      Failed - Lipid Panel in normal range within the last 12 months    Cholesterol, Total  Date Value Ref Range Status  11/13/2022 254 (H) 100 - 199 mg/dL Final   LDL Chol Calc (NIH)  Date Value Ref Range Status  11/13/2022 150 (H) 0 - 99 mg/dL Final   HDL  Date Value Ref Range Status  11/13/2022 94 >39 mg/dL Final   Triglycerides  Date Value Ref Range Status  11/13/2022 64 0 - 149 mg/dL Final         Passed - Patient is not pregnant      Passed - Valid encounter within last 12 months    Recent Outpatient Visits           5 months ago Hyperlipidemia, unspecified hyperlipidemia type   [facility]   12 months ago Hyperlipidemia, unspecified hyperlipidemia type   [facility]   1 year ago Aphthous ulcer   [facility]   1 year ago Chest pain, unspecified type   [facility]   1 year ago Dysfunction of both eustachian tubes   [facility]       Future Appointments             Tomorrow Judi Saa, DO [facility]   In 4 days Duanne Limerick, MD [facility]

## 2023-05-11 ENCOUNTER — Ambulatory Visit (INDEPENDENT_AMBULATORY_CARE_PROVIDER_SITE_OTHER): Payer: BC Managed Care – PPO | Admitting: Family Medicine

## 2023-05-11 ENCOUNTER — Encounter: Payer: Self-pay | Admitting: Family Medicine

## 2023-05-11 VITALS — BP 122/82 | HR 93 | Ht 65.0 in | Wt 146.0 lb

## 2023-05-11 DIAGNOSIS — G2589 Other specified extrapyramidal and movement disorders: Secondary | ICD-10-CM | POA: Diagnosis not present

## 2023-05-11 DIAGNOSIS — M9908 Segmental and somatic dysfunction of rib cage: Secondary | ICD-10-CM

## 2023-05-11 DIAGNOSIS — M9904 Segmental and somatic dysfunction of sacral region: Secondary | ICD-10-CM

## 2023-05-11 DIAGNOSIS — M9901 Segmental and somatic dysfunction of cervical region: Secondary | ICD-10-CM

## 2023-05-11 DIAGNOSIS — M9902 Segmental and somatic dysfunction of thoracic region: Secondary | ICD-10-CM

## 2023-05-11 DIAGNOSIS — M9903 Segmental and somatic dysfunction of lumbar region: Secondary | ICD-10-CM | POA: Diagnosis not present

## 2023-05-11 NOTE — Assessment & Plan Note (Signed)
Scapular dyskinesis noted.  Discussed icing regimen and home exercises, discussed which activities to do and which ones to avoid, increase activity slowly.  Have some increase in anxiety secondary to daughter graduating.  Follow-up again in 6 to 8 weeks

## 2023-05-11 NOTE — Patient Instructions (Signed)
Good to see you! Hope you get the do over See you again in 7-8 weeks

## 2023-05-14 ENCOUNTER — Ambulatory Visit: Payer: BC Managed Care – PPO | Admitting: Family Medicine

## 2023-05-14 DIAGNOSIS — E785 Hyperlipidemia, unspecified: Secondary | ICD-10-CM

## 2023-05-17 ENCOUNTER — Ambulatory Visit (INDEPENDENT_AMBULATORY_CARE_PROVIDER_SITE_OTHER): Payer: BC Managed Care – PPO | Admitting: Family Medicine

## 2023-05-17 ENCOUNTER — Encounter: Payer: Self-pay | Admitting: Family Medicine

## 2023-05-17 VITALS — BP 110/70 | HR 68 | Ht 65.0 in | Wt 147.0 lb

## 2023-05-17 DIAGNOSIS — E785 Hyperlipidemia, unspecified: Secondary | ICD-10-CM

## 2023-05-17 MED ORDER — ATORVASTATIN CALCIUM 20 MG PO TABS: 20.0000 mg | ORAL_TABLET | Freq: Every day | ORAL | 1 refills | Status: AC

## 2023-05-17 NOTE — Progress Notes (Signed)
Date:  05/17/2023   Name:  Stephanie Mcguire   DOB:  [birth year]   MRN:  [id number]   Chief Complaint: Hyperlipidemia  Hyperlipidemia This is a chronic problem. The current episode started more than 1 year ago. The problem is controlled. Recent lipid tests were reviewed and are normal. She has no history of diabetes or obesity. Pertinent negatives include no chest pain, focal weakness, myalgias or shortness of breath. Current antihyperlipidemic treatment includes statins. The current treatment provides moderate improvement of lipids. There are no compliance problems.  Risk factors for coronary artery disease include dyslipidemia and hypertension.    Lab Results  Component Value Date   NA 139 11/13/2022   K 4.3 11/13/2022   CO2 26 11/13/2022   GLUCOSE 84 11/13/2022   BUN 10 11/13/2022   CREATININE 0.96 11/13/2022   CALCIUM 9.0 11/13/2022   EGFR 71 11/13/2022   GFRNONAA >60 01/23/2022   Lab Results  Component Value Date   CHOL 254 (H) 11/13/2022   HDL 94 11/13/2022   LDLCALC 150 (H) 11/13/2022   TRIG 64 11/13/2022   Lab Results  Component Value Date   TSH 0.924 01/15/2022   No results found for: "HGBA1C" Lab Results  Component Value Date   WBC 4.9 01/23/2022   HGB 13.5 01/23/2022   HCT 41.2 01/23/2022   MCV 95.2 01/23/2022   PLT 303 01/23/2022   Lab Results  Component Value Date   ALT 12 11/13/2022   AST 16 11/13/2022   ALKPHOS 66 11/13/2022   BILITOT <0.2 11/13/2022   No results found for: "25OHVITD2", "25OHVITD3", "VD25OH"   Review of Systems  Constitutional:  Negative for fatigue and unexpected weight change.  HENT:  Negative for postnasal drip, sinus pain and sore throat.   Eyes:  Negative for visual disturbance.  Respiratory:  Negative for cough, chest tightness, shortness of breath and wheezing.   Cardiovascular:  Negative for chest pain, palpitations and leg swelling.  Gastrointestinal:  Negative for abdominal pain and constipation.  Endocrine:  Negative for polydipsia and polyuria.  Musculoskeletal:  Negative for myalgias.  Neurological:  Negative for focal weakness.    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    Allergies  Allergen Reactions   Gluten Meal     Other Reaction(s): Other (See Comments)  Headache GI upset    Past Surgical History:  Procedure Laterality Date   BUNIONECTOMY     MANDIBLE SURGERY  12/29/2007   RECONSTRUCTION OF EYELID  1989 and 1990   UPPER GASTROINTESTINAL ENDOSCOPY      Social History   Tobacco Use   Smoking status: Never   Smokeless tobacco: Never  Vaping Use   Vaping Use: Never used  Substance Use Topics   Alcohol use: Yes    Alcohol/week: 2.0 standard drinks of alcohol    Types: 2 Glasses of wine per week    Comment: rarely   Drug use: No     Medication list has been reviewed and updated.  Current  Meds  Medication Sig   albuterol (VENTOLIN HFA) 108 (90 Base) MCG/ACT inhaler USE 2 INHALATIONS EVERY 6 HOURS AS NEEDED FOR WHEEZING OR SHORTNESS OF BREATH   atorvastatin (LIPITOR) 20 MG tablet TAKE 1 TABLET DAILY   carbamazepine (CARBATROL) 200 MG 12 hr capsule Take 200 mg by mouth 2 (two) times daily.   carbamide peroxide (DEBROX) 6.5 % OTIC solution Place 5 drops into both ears 2 (two) times daily.   cetirizine-pseudoephedrine (ZYRTEC-D) 5-120 MG tablet Take 1 tablet by mouth 2 (two) times daily as needed for allergies.   diazepam (VALIUM) 2 MG tablet Take  2 mg by mouth as needed.   famotidine (PEPCID) 20 MG tablet TAKE ONE TABLET BY MOUTH ONE TIME DAILY   fluticasone (FLONASE) 50 MCG/ACT nasal spray Place 2 sprays into both nostrils daily.   lamoTRIgine (LAMICTAL) 150 MG tablet Take 300 mg by mouth at bedtime.   norethindrone-ethinyl estradiol (LOESTRIN 1/20, 21,) 1-20 MG-MCG tablet Take 1 tablet by mouth daily.   norethindrone-ethinyl estradiol (LOESTRIN) 1-20 MG-MCG tablet Take 1 tablet by mouth daily.   pantoprazole (PROTONIX) 40 MG tablet TAKE ONE TABLET BY MOUTH ONE TIME DAILY   [DISCONTINUED] benzonatate (TESSALON PERLES) 100 MG capsule Take 1 capsule (100 mg total) by mouth 3 (three) times daily as needed for cough.       05/17/2023    8:45 AM 11/13/2022   10:39 AM 05/12/2022   10:20 AM 01/15/2022    1:50 PM  GAD 7 : Generalized Anxiety Score  Nervous, Anxious, on Edge 0 0 3 0  Control/stop worrying 0 0 1 0  Worry too much - different things 0 0 3 0  Trouble relaxing 0 0 2 0  Restless 0 0 2 0  Easily annoyed or irritable 0 0 2 0  Afraid - awful might happen 0 0 1 0  Total GAD 7 Score 0 0 14 0  Anxiety Difficulty Not difficult at all Not difficult at all Somewhat difficult Not difficult at all       05/17/2023    8:45 AM 11/13/2022   10:39 AM 05/12/2022   10:19 AM  Depression screen PHQ 2/9  Decreased Interest 0 0 1  Down, Depressed, Hopeless 0 0 2  PHQ - 2 Score 0 0 3  Altered sleeping 0 0 1  Tired, decreased energy 0 0 3  Change in appetite 0 0 1  Feeling bad or failure about yourself  0 0 0  Trouble concentrating 0 0 2  Moving slowly or fidgety/restless 0 0 1  Suicidal thoughts 0 0 0  PHQ-9 Score 0 0 11  Difficult doing work/chores Not difficult at all Not difficult at all Very difficult    BP Readings from Last 3 Encounters:  05/17/23 110/70  05/11/23 122/82  03/30/23 112/84    Physical Exam Vitals and nursing note reviewed. Exam conducted with a chaperone present.  Constitutional:      General: She is not  in acute distress.    Appearance: She is not diaphoretic.  HENT:     Head: Normocephalic and atraumatic.     Right Ear: Tympanic membrane and external ear normal.     Left Ear: Tympanic membrane and external ear normal.     Nose: Nose normal.     Mouth/Throat:     Mouth: Mucous membranes are moist.  Eyes:     General:        Right eye: No discharge.  Left eye: No discharge.     Conjunctiva/sclera: Conjunctivae normal.     Pupils: Pupils are equal, round, and reactive to light.  Neck:     Thyroid: No thyromegaly.     Vascular: No JVD.  Cardiovascular:     Rate and Rhythm: Normal rate and regular rhythm.     Pulses: Normal pulses.     Heart sounds: Normal heart sounds, S1 normal and S2 normal. No murmur heard.    No systolic murmur is present.     No diastolic murmur is present.     No friction rub. No gallop. No S3 or S4 sounds.  Pulmonary:     Effort: Pulmonary effort is normal.     Breath sounds: Normal breath sounds. No wheezing, rhonchi or rales.  Abdominal:     General: Bowel sounds are normal.     Palpations: Abdomen is soft. There is no mass.     Tenderness: There is no abdominal tenderness. There is no guarding.  Musculoskeletal:     Cervical back: Normal range of motion and neck supple.  Lymphadenopathy:     Cervical: No cervical adenopathy.  Skin:    General: Skin is warm and dry.  Neurological:     Mental Status: She is alert.     Deep Tendon Reflexes: Reflexes are normal and symmetric.     Reflex Scores:      Brachioradialis reflexes are 2+ on the right side and 2+ on the left side.    Wt Readings from Last 3 Encounters:  05/17/23 147 lb (66.7 kg)  05/11/23 146 lb (66.2 kg)  03/30/23 145 lb (65.8 kg)    BP 110/70   Pulse 68   Ht 5\' 5"  (1.651 m)   Wt 147 lb (66.7 kg)   LMP 05/12/2023 (Approximate)   SpO2 97%   BMI 24.46 kg/m   Assessment and Plan:  1. Hyperlipidemia, unspecified hyperlipidemia type Chronic.  Controlled.  Stable.  Likely  will control atorvastatin at 20 mg once a day.  Will check lipid panel for current level of LDL control and renal function panel for electrolytes and GFR.  Will recheck patient in 6 months. - atorvastatin (LIPITOR) 20 MG tablet; Take 1 tablet (20 mg total) by mouth daily.  Dispense: 90 tablet; Refill: 1 - Lipid Panel With LDL/HDL Ratio - Renal Function Panel    Elizabeth Sauer, MD

## 2023-05-18 LAB — RENAL FUNCTION PANEL
Albumin: 4.3 g/dL (ref 3.8–4.9)
BUN/Creatinine Ratio: 14 (ref 9–23)
BUN: 14 mg/dL (ref 6–24)
CO2: 25 mmol/L (ref 20–29)
Calcium: 9.3 mg/dL (ref 8.7–10.2)
Chloride: 102 mmol/L (ref 96–106)
Creatinine, Ser: 0.97 mg/dL (ref 0.57–1.00)
Glucose: 92 mg/dL (ref 70–99)
Phosphorus: 3.8 mg/dL (ref 3.0–4.3)
Potassium: 4.6 mmol/L (ref 3.5–5.2)
Sodium: 139 mmol/L (ref 134–144)
eGFR: 70 mL/min/{1.73_m2} (ref >59)

## 2023-05-18 LAB — LIPID PANEL WITH LDL/HDL RATIO
Cholesterol, Total: 245 mg/dL — ABNORMAL HIGH (ref 100–199)
HDL: 97 mg/dL (ref >39)
LDL Chol Calc (NIH): 132 mg/dL — ABNORMAL HIGH (ref 0–99)
LDL/HDL Ratio: 1.4 ratio (ref 0.0–3.2)
Triglycerides: 93 mg/dL (ref 0–149)
VLDL Cholesterol Cal: 16 mg/dL (ref 5–40)

## 2023-06-12 ENCOUNTER — Other Ambulatory Visit: Payer: Self-pay | Admitting: Internal Medicine

## 2023-06-28 NOTE — Progress Notes (Unsigned)
[name] [address] [place] [address] Phone: [phone] Subjective:   [name], am serving as a scribe for Dr. Antoine Primas.  I'm seeing this patient by the request  of:  Duanne Limerick, MD  CC: Back and neck pain follow-up  [id number]  Stephanie Mcguire is a 52 y.o. female coming in with complaint of back and neck pain. OMT 05/11/2023. Patient states same per usual. NO new concerns.  Medications patient has been prescribed: Gabapentin  Taking:         Reviewed prior external information including notes and imaging from previsou exam, outside providers and external EMR if available.   As well as notes that were available from care everywhere and other healthcare systems.  Past medical history, social, surgical and family history all reviewed in electronic medical record.  No pertanent information unless stated regarding to the chief complaint.   Past Medical History:  Diagnosis Date   Allergy    Anxiety    Aphthous ulcer    Arthritis    spondylosis   Asthma    Bipolar 2 disorder (HCC)    Bipolar disease, chronic (HCC)    Chest pain    Depression    DOE (dyspnea on exertion)    Elevated cholesterol 2000   GERD (gastroesophageal reflux disease)    Hyperlipidemia    Thyroid disease     Allergies  Allergen Reactions   Gluten Meal     Other Reaction(s): Other (See Comments)  Headache GI upset     Review of Systems:  No headache, visual changes, nausea, vomiting, diarrhea, constipation, dizziness, abdominal pain, skin rash, fevers, chills, night sweats, weight loss, swollen lymph nodes, body aches, joint swelling, chest pain, shortness of breath, mood changes. POSITIVE muscle aches  Objective  Blood pressure 128/80, pulse 87, height 5\' 5"  (1.651 m), weight 147 lb (66.7 kg), SpO2 98 %.   General: No apparent distress alert and oriented x3 mood and affect normal, dressed appropriately.  HEENT: Pupils equal,  extraocular movements intact  Respiratory: Patient's speak in full sentences and does not appear short of breath  Cardiovascular: No lower extremity edema, non tender, no erythema  Back exam does have some loss of lordosis noted.  Significant tightness noted in the parascapular area more right greater than left.  Limited sidebending right greater than left.  Osteopathic findings  C2 flexed rotated and side bent right C7 flexed rotated and side bent left T3 extended rotated and side bent right inhaled rib T9 extended rotated and side bent left L3 flexed rotated and side bent right Sacrum right on right       Assessment and Plan:  Scapular dyskinesis Scapular dyskinesis still noted.  Do feel that some of the stress that patient has does seem to cause aggravation.  Discussed icing regimen and home exercises, discussed which activities to do and which ones to avoid.  Patient will come in after moving her daughter to college.  Follow-up again in 6 to 8 weeks.    Nonallopathic problems  Decision today to treat with OMT was based on Physical Exam  After verbal consent patient was treated with HVLA, ME, FPR techniques in cervical, rib, thoracic, lumbar, and sacral  areas  Patient tolerated the procedure well with improvement in symptoms  Patient given exercises, stretches and lifestyle modifications  See medications in patient instructions if given  Patient will follow up in 4-8 weeks     The above  documentation has been reviewed and is accurate and complete Judi Saa, DO         Note: This dictation was prepared with Dragon dictation along with smaller phrase technology. Any transcriptional errors that result from this process are unintentional.

## 2023-06-29 ENCOUNTER — Encounter: Payer: Self-pay | Admitting: Family Medicine

## 2023-06-29 ENCOUNTER — Ambulatory Visit (INDEPENDENT_AMBULATORY_CARE_PROVIDER_SITE_OTHER): Payer: BC Managed Care – PPO | Admitting: Family Medicine

## 2023-06-29 VITALS — BP 128/80 | HR 87 | Ht 65.0 in | Wt 147.0 lb

## 2023-06-29 DIAGNOSIS — M9901 Segmental and somatic dysfunction of cervical region: Secondary | ICD-10-CM | POA: Diagnosis not present

## 2023-06-29 DIAGNOSIS — M9908 Segmental and somatic dysfunction of rib cage: Secondary | ICD-10-CM | POA: Diagnosis not present

## 2023-06-29 DIAGNOSIS — M9903 Segmental and somatic dysfunction of lumbar region: Secondary | ICD-10-CM

## 2023-06-29 DIAGNOSIS — M9902 Segmental and somatic dysfunction of thoracic region: Secondary | ICD-10-CM

## 2023-06-29 DIAGNOSIS — G2589 Other specified extrapyramidal and movement disorders: Secondary | ICD-10-CM | POA: Diagnosis not present

## 2023-06-29 DIAGNOSIS — M9904 Segmental and somatic dysfunction of sacral region: Secondary | ICD-10-CM | POA: Diagnosis not present

## 2023-06-29 NOTE — Assessment & Plan Note (Signed)
Scapular dyskinesis still noted.  Do feel that some of the stress that patient has does seem to cause aggravation.  Discussed icing regimen and home exercises, discussed which activities to do and which ones to avoid.  Patient will come in after moving her daughter to college.  Follow-up again in 6 to 8 weeks.

## 2023-06-29 NOTE — Patient Instructions (Signed)
Good to see you! Still need to work on homework See you again in 6-8 weeks or after move in

## 2023-07-14 DIAGNOSIS — E039 Hypothyroidism, unspecified: Secondary | ICD-10-CM | POA: Diagnosis not present

## 2023-07-15 DIAGNOSIS — G252 Other specified forms of tremor: Secondary | ICD-10-CM | POA: Diagnosis not present

## 2023-07-20 DIAGNOSIS — F410 Panic disorder [episodic paroxysmal anxiety] without agoraphobia: Secondary | ICD-10-CM | POA: Diagnosis not present

## 2023-07-20 DIAGNOSIS — F3174 Bipolar disorder, in full remission, most recent episode manic: Secondary | ICD-10-CM | POA: Diagnosis not present

## 2023-07-20 DIAGNOSIS — F3176 Bipolar disorder, in full remission, most recent episode depressed: Secondary | ICD-10-CM | POA: Diagnosis not present

## 2023-07-21 DIAGNOSIS — D485 Neoplasm of uncertain behavior of skin: Secondary | ICD-10-CM | POA: Diagnosis not present

## 2023-07-22 DIAGNOSIS — E039 Hypothyroidism, unspecified: Secondary | ICD-10-CM | POA: Diagnosis not present

## 2023-08-17 NOTE — Progress Notes (Unsigned)
[name] [address] [place] [address] Phone: [phone] Subjective:   Stephanie Mcguire, am serving as a scribe for Dr. Antoine Primas.  I'm seeing this patient by the request  of:  Duanne Limerick, MD  CC: Back and neck pain follow-up  [id number]  Stephanie Mcguire is a 52 y.o. female coming in with complaint of back and neck pain. OMT 06/29/2023. Patient states doing well. No new concerns. Same per usual.  Medications patient has been prescribed: None  Taking:         Reviewed prior external information including notes and imaging from previsou exam, outside providers and external EMR if available.   As well as notes that were available from care everywhere and other healthcare systems.  Past medical history, social, surgical and family history all reviewed in electronic medical record.  No pertanent information unless stated regarding to the chief complaint.   Past Medical History:  Diagnosis Date   Allergy    Anxiety    Aphthous ulcer    Arthritis    spondylosis   Asthma    Bipolar 2 disorder (HCC)    Bipolar disease, chronic (HCC)    Chest pain    Depression    DOE (dyspnea on exertion)    Elevated cholesterol 2000   GERD (gastroesophageal reflux disease)    Hyperlipidemia    Thyroid disease     Allergies  Allergen Reactions   Gluten Meal     Other Reaction(s): Other (See Comments)  Headache GI upset     Review of Systems:  No headache, visual changes, nausea, vomiting, diarrhea, constipation, dizziness, abdominal pain, skin rash, fevers, chills, night sweats, weight loss, swollen lymph nodes, body aches, joint swelling, chest pain, shortness of breath, mood changes. POSITIVE muscle aches  Objective  Blood pressure 128/82, pulse (!) 104, height 5\' 5"  (1.651 m), weight 146 lb (66.2 kg), SpO2 98%.   General: No apparent distress alert and oriented x3 mood and affect normal, dressed appropriately.  HEENT:  Pupils equal, extraocular movements intact  Respiratory: Patient's speak in full sentences and does not appear short of breath  Cardiovascular: No lower extremity edema, non tender, no erythema  Poor posture noted.  Patient does have some scapular dyskinesis noted.  Still some tension noted in the neck overall. Back   Osteopathic findings  C3 flexed rotated and side bent right C7 flexed rotated and side bent left T3 extended rotated and side bent right inhaled rib T7 extended rotated and side bent left L1 flexed rotated and side bent right L3 flexed rotated and side bent left Sacrum right on right       Assessment and Plan:  Scapular dyskinesis Still needs some work, posture.  Some exacerbation secondary to moving her daughter into college recently.  Thinks that this is contributed to some of the more of the discomfort and pain noted at this time.  Discussed icing regimen and home exercises.  Patient will start to focus on herself now a little bit.  No changes in medication.  Follow-up again in 6 to 8 weeks otherwise.    Nonallopathic problems  Decision today to treat with OMT was based on Physical Exam  After verbal consent patient was treated with HVLA, ME, FPR techniques in cervical, rib, thoracic, lumbar, and sacral  areas  Patient tolerated the procedure well with improvement in symptoms  Patient given exercises, stretches and lifestyle modifications  See medications in patient instructions if  given  Patient will follow up in 4-8 weeks             Note: This dictation was prepared with Dragon dictation along with smaller phrase technology. Any transcriptional errors that result from this process are unintentional.

## 2023-08-18 ENCOUNTER — Ambulatory Visit (INDEPENDENT_AMBULATORY_CARE_PROVIDER_SITE_OTHER): Payer: BC Managed Care – PPO | Admitting: Family Medicine

## 2023-08-18 ENCOUNTER — Encounter: Payer: Self-pay | Admitting: Family Medicine

## 2023-08-18 VITALS — BP 128/82 | HR 104 | Ht 65.0 in | Wt 146.0 lb

## 2023-08-18 DIAGNOSIS — M9901 Segmental and somatic dysfunction of cervical region: Secondary | ICD-10-CM | POA: Diagnosis not present

## 2023-08-18 DIAGNOSIS — M9908 Segmental and somatic dysfunction of rib cage: Secondary | ICD-10-CM

## 2023-08-18 DIAGNOSIS — M9903 Segmental and somatic dysfunction of lumbar region: Secondary | ICD-10-CM

## 2023-08-18 DIAGNOSIS — M9902 Segmental and somatic dysfunction of thoracic region: Secondary | ICD-10-CM | POA: Diagnosis not present

## 2023-08-18 DIAGNOSIS — M9904 Segmental and somatic dysfunction of sacral region: Secondary | ICD-10-CM

## 2023-08-18 DIAGNOSIS — G2589 Other specified extrapyramidal and movement disorders: Secondary | ICD-10-CM

## 2023-08-18 NOTE — Patient Instructions (Signed)
Good to see you! With all considering think you're doing great Work on posture See you again in 7-8 weeks

## 2023-08-18 NOTE — Assessment & Plan Note (Signed)
Still needs some work, posture.  Some exacerbation secondary to moving her daughter into college recently.  Thinks that this is contributed to some of the more of the discomfort and pain noted at this time.  Discussed icing regimen and home exercises.  Patient will start to focus on herself now a little bit.  No changes in medication.  Follow-up again in 6 to 8 weeks otherwise.

## 2023-08-23 ENCOUNTER — Encounter: Payer: Self-pay | Admitting: Podiatry

## 2023-08-23 ENCOUNTER — Ambulatory Visit: Payer: BC Managed Care – PPO | Admitting: Podiatry

## 2023-08-23 ENCOUNTER — Telehealth: Payer: Self-pay | Admitting: Podiatry

## 2023-08-23 ENCOUNTER — Ambulatory Visit (INDEPENDENT_AMBULATORY_CARE_PROVIDER_SITE_OTHER): Payer: BC Managed Care – PPO

## 2023-08-23 DIAGNOSIS — M2012 Hallux valgus (acquired), left foot: Secondary | ICD-10-CM

## 2023-08-23 NOTE — Telephone Encounter (Signed)
Pt called and was seen today and received a shot and is not experiencing pain in the area. What should she do for the pain?

## 2023-08-23 NOTE — Telephone Encounter (Signed)
Notified pt of what the assistant said and she said thank you.

## 2023-08-24 NOTE — Progress Notes (Signed)
Subjective:  Patient ID: Stephanie Mcguire, female    DOB: 02/13/1971,  MRN: [id number] HPI Chief Complaint  Patient presents with   Foot Pain    1st MPJ left - bunion deformity x years, tent hit her foot while at the beach [place] and has been tender with any shoe since, had the right one fixed in her 38's   New Patient (Initial Visit)    52 y.o. female presents with the above complaint.   ROS: Denies fever chills nausea vomiting muscle aches pains calf pain back pain chest pain shortness of breath.  Past Medical History:  Diagnosis Date   Allergy    Anxiety    Aphthous ulcer    Arthritis    spondylosis   Asthma    Bipolar 2 disorder (HCC)    Bipolar disease, chronic (HCC)    Chest pain    Depression    DOE (dyspnea on exertion)    Elevated cholesterol 2000   GERD (gastroesophageal reflux disease)    Hyperlipidemia    Thyroid disease    Past Surgical History:  Procedure Laterality Date   BUNIONECTOMY     MANDIBLE SURGERY  12/29/2007   RECONSTRUCTION OF EYELID  1989 and 1990   UPPER GASTROINTESTINAL ENDOSCOPY      Current Outpatient Medications:    albuterol (VENTOLIN HFA) 108 (90 Base) MCG/ACT inhaler, USE 2 INHALATIONS EVERY 6 HOURS AS NEEDED FOR WHEEZING OR SHORTNESS OF BREATH, Disp: 25.5 g, Rfl: 3   atorvastatin (LIPITOR) 20 MG tablet, Take 1 tablet (20 mg total) by mouth daily., Disp: 90 tablet, Rfl: 1   carbamazepine (CARBATROL) 200 MG 12 hr capsule, Take 200 mg by mouth 2 (two) times daily., Disp: , Rfl:    carbamide peroxide (DEBROX) 6.5 % OTIC solution, Place 5 drops into both ears 2 (two) times daily., Disp: 15 mL, Rfl: 2   cetirizine-pseudoephedrine (ZYRTEC-D) 5-120 MG tablet, Take 1 tablet by mouth 2 (two) times daily as needed for allergies., Disp: 30 tablet, Rfl: 0   diazepam (VALIUM) 2 MG tablet, Take 2 mg by mouth as needed., Disp: , Rfl:    famotidine (PEPCID) 20 MG tablet, TAKE ONE TABLET BY MOUTH ONE TIME DAILY, Disp: 30 tablet, Rfl: 1   fluticasone  (FLONASE) 50 MCG/ACT nasal spray, Place 2 sprays into both nostrils daily., Disp: 16 g, Rfl: 0   gabapentin (NEURONTIN) 100 MG capsule, TAKE 2 CAPSULES AT BEDTIME (Patient not taking: Reported on 05/17/2023), Disp: 180 capsule, Rfl: 3   lamoTRIgine (LAMICTAL) 150 MG tablet, Take 300 mg by mouth at bedtime., Disp: , Rfl:    NON FORMULARY, [facility] S WITH MAALOX (Patient not taking: Reported on 05/17/2023), Disp: , Rfl:    norethindrone-ethinyl estradiol (LOESTRIN 1/20, 21,) 1-20 MG-MCG tablet, Take 1 tablet by mouth daily., Disp: 28 tablet, Rfl: 0   norethindrone-ethinyl estradiol (LOESTRIN) 1-20 MG-MCG tablet, Take 1 tablet by mouth daily., Disp: 84 tablet, Rfl: 3   pantoprazole (PROTONIX) 40 MG tablet, TAKE ONE TABLET BY MOUTH ONE TIME DAILY, Disp: 30 tablet, Rfl: 3  Allergies  Allergen Reactions   Gluten Meal     Other Reaction(s): Other (See Comments)  Headache GI upset   Review of Systems Objective:  There were no vitals filed for this visit.  General: Well developed, nourished, in no acute distress, alert and oriented x3   Dermatological: Skin is warm, dry and supple bilateral. Nails x 10 are well maintained; remaining integument appears unremarkable at this time. There are  no open sores, no preulcerative lesions, no rash or signs of infection present.  Vascular: Dorsalis Pedis artery and Posterior Tibial artery pedal pulses are 2/4 bilateral with immedate capillary fill time. Pedal hair growth present. No varicosities and no lower extremity edema present bilateral.   Neruologic: Grossly intact via light touch bilateral. Vibratory intact via tuning fork bilateral. Protective threshold with Semmes Wienstein monofilament intact to all pedal sites bilateral. Patellar and Achilles deep tendon reflexes 2+ bilateral. No Babinski or clonus noted bilateral.   Musculoskeletal: No gross boney pedal deformities bilateral. No pain, crepitus, or limitation noted with foot and ankle range of motion  bilateral. Muscular strength 5/5 in all groups tested bilateral.  Mild tenderness on range of motion and on palpation of the first metatarsophalangeal joint of the left foot.  Full range of motion without crepitation.  She has tenderness on distraction on palpation of the joint itself mild tenderness on palpation of the hypertrophic tubercle of the head of the first metatarsal.  Gait: Unassisted, Nonantalgic.    Radiographs:  Radiographs taken today demonstrate osseously mature individual near rectus foot mildly hypertrophic medial condyles to the first metatarsal.  She does have some interphalangeal of the hallux making her bunion look worse than it actually does.  Assessment & Plan:   Assessment: Capsulitis first metatarsophalangeal joint hallux abductovalgus deformity left foot.  Plan: Discussed etiology pathology conservative surgical therapies at this point went ahead and performed intra-articular injection we did discuss the possible need for surgical intervention at this time the angles are very minimal.  I injected after Betadine skin prep 4 mg of dexamethasone.  She tolerated the procedure well I will follow-up with her in the near future if necessary we discussed appropriate shoe gear.     Stephanie Mcguire T. [place], [place]

## 2023-09-03 ENCOUNTER — Encounter: Payer: Self-pay | Admitting: Podiatry

## 2023-09-03 DIAGNOSIS — M19072 Primary osteoarthritis, left ankle and foot: Secondary | ICD-10-CM

## 2023-09-08 DIAGNOSIS — H40013 Open angle with borderline findings, low risk, bilateral: Secondary | ICD-10-CM | POA: Diagnosis not present

## 2023-09-18 ENCOUNTER — Other Ambulatory Visit: Payer: Self-pay | Admitting: Internal Medicine

## 2023-09-20 ENCOUNTER — Ambulatory Visit: Payer: BC Managed Care – PPO | Admitting: Podiatry

## 2023-09-20 ENCOUNTER — Ambulatory Visit: Payer: BC Managed Care – PPO

## 2023-09-23 ENCOUNTER — Ambulatory Visit (INDEPENDENT_AMBULATORY_CARE_PROVIDER_SITE_OTHER): Payer: BC Managed Care – PPO | Admitting: Family Medicine

## 2023-09-23 ENCOUNTER — Encounter: Payer: Self-pay | Admitting: Family Medicine

## 2023-09-23 VITALS — BP 128/70 | HR 84 | Ht 65.0 in | Wt 147.0 lb

## 2023-09-23 DIAGNOSIS — H6123 Impacted cerumen, bilateral: Secondary | ICD-10-CM

## 2023-09-23 NOTE — Progress Notes (Signed)
Date:  09/23/2023   Name:  Stephanie Mcguire   DOB:  June 19, 1971   MRN:  [id number]   Chief Complaint: Ear Fullness (Right, left a little)  Ear Fullness  There is pain in both ears. This is a new problem. The current episode started 1 to 4 weeks ago. The problem has been unchanged. There has been no fever. The patient is experiencing no pain. Associated symptoms include hearing loss. Pertinent negatives include no coughing, ear discharge, rhinorrhea or sore throat. She has tried nothing for the symptoms.    Lab Results  Component Value Date   NA 139 05/17/2023   K 4.6 05/17/2023   CO2 25 05/17/2023   GLUCOSE 92 05/17/2023   BUN 14 05/17/2023   CREATININE 0.97 05/17/2023   CALCIUM 9.3 05/17/2023   EGFR 70 05/17/2023   GFRNONAA >60 01/23/2022   Lab Results  Component Value Date   CHOL 245 (H) 05/17/2023   HDL 97 05/17/2023   LDLCALC 132 (H) 05/17/2023   TRIG 93 05/17/2023   Lab Results  Component Value Date   TSH 0.924 01/15/2022   No results found for: "HGBA1C" Lab Results  Component Value Date   WBC 4.9 01/23/2022   HGB 13.5 01/23/2022   HCT 41.2 01/23/2022   MCV 95.2 01/23/2022   PLT 303 01/23/2022   Lab Results  Component Value Date   ALT 12 11/13/2022   AST 16 11/13/2022   ALKPHOS 66 11/13/2022   BILITOT <0.2 11/13/2022   No results found for: "25OHVITD2", "25OHVITD3", "VD25OH"   Review of Systems  HENT:  Positive for hearing loss. Negative for ear discharge, rhinorrhea, sinus pressure, sinus pain and sore throat.   Respiratory:  Negative for cough, chest tightness and shortness of breath.     Patient Active Problem List   Diagnosis Date Noted   Scapular dyskinesis 05/28/2022   Somatic dysfunction of spine, cervical 05/28/2022   Lumbar radiculopathy 10/17/2020   Patellofemoral arthritis of right knee 10/01/2020   Interstitial lung disease (HCC) 10/14/2015   Congenital hypothyroidism without goiter 10/14/2015   Abnormal chest CT 10/14/2015    Essential vulvodynia 12/10/2011    Allergies  Allergen Reactions   Gluten Meal     Other Reaction(s): Other (See Comments)  Headache GI upset    Past Surgical History:  Procedure Laterality Date   BUNIONECTOMY     MANDIBLE SURGERY  12/29/2007   RECONSTRUCTION OF EYELID  1989 and 1990   UPPER GASTROINTESTINAL ENDOSCOPY      Social History   Tobacco Use   Smoking status: Never   Smokeless tobacco: Never  Vaping Use   Vaping status: Never Used  Substance Use Topics   Alcohol use: Yes    Alcohol/week: 2.0 standard drinks of alcohol    Types: 2 Glasses of wine per week    Comment: rarely   Drug use: No     Medication list has been reviewed and updated.  Current Meds  Medication Sig   atorvastatin (LIPITOR) 20 MG tablet Take 1 tablet (20 mg total) by mouth daily.   carbamazepine (CARBATROL) 200 MG 12 hr capsule Take 200 mg by mouth 2 (two) times daily.   carbamide peroxide (DEBROX) 6.5 % OTIC solution Place 5 drops into both ears 2 (two) times daily.   cetirizine-pseudoephedrine (ZYRTEC-D) 5-120 MG tablet Take 1 tablet by mouth 2 (two) times daily as needed for allergies.   famotidine (PEPCID) 20 MG tablet TAKE ONE TABLET BY MOUTH ONE TIME  DAILY   fluticasone (FLONASE) 50 MCG/ACT nasal spray Place 2 sprays into both nostrils daily.   lamoTRIgine (LAMICTAL) 150 MG tablet Take 300 mg by mouth at bedtime.   levothyroxine (SYNTHROID) 100 MCG tablet Take 100 mcg by mouth daily before breakfast.   norethindrone-ethinyl estradiol (LOESTRIN 1/20, 21,) 1-20 MG-MCG tablet Take 1 tablet by mouth daily.   pantoprazole (PROTONIX) 40 MG tablet TAKE ONE TABLET BY MOUTH ONE TIME DAILY   [DISCONTINUED] albuterol (VENTOLIN HFA) 108 (90 Base) MCG/ACT inhaler USE 2 INHALATIONS EVERY 6 HOURS AS NEEDED FOR WHEEZING OR SHORTNESS OF BREATH       05/17/2023    8:45 AM 11/13/2022   10:39 AM 05/12/2022   10:20 AM 01/15/2022    1:50 PM  GAD 7 : Generalized Anxiety Score  Nervous, Anxious, on  Edge 0 0 3 0  Control/stop worrying 0 0 1 0  Worry too much - different things 0 0 3 0  Trouble relaxing 0 0 2 0  Restless 0 0 2 0  Easily annoyed or irritable 0 0 2 0  Afraid - awful might happen 0 0 1 0  Total GAD 7 Score 0 0 14 0  Anxiety Difficulty Not difficult at all Not difficult at all Somewhat difficult Not difficult at all       05/17/2023    8:45 AM 11/13/2022   10:39 AM 05/12/2022   10:19 AM  Depression screen PHQ 2/9  Decreased Interest 0 0 1  Down, Depressed, Hopeless 0 0 2  PHQ - 2 Score 0 0 3  Altered sleeping 0 0 1  Tired, decreased energy 0 0 3  Change in appetite 0 0 1  Feeling bad or failure about yourself  0 0 0  Trouble concentrating 0 0 2  Moving slowly or fidgety/restless 0 0 1  Suicidal thoughts 0 0 0  PHQ-9 Score 0 0 11  Difficult doing work/chores Not difficult at all Not difficult at all Very difficult    BP Readings from Last 3 Encounters:  09/23/23 128/70  08/18/23 128/82  06/29/23 128/80    Physical Exam Vitals and nursing note reviewed. Exam conducted with a chaperone present.  HENT:     Head: Normocephalic.     Right Ear: Tympanic membrane and external ear normal. There is impacted cerumen.     Left Ear: Tympanic membrane and external ear normal. There is impacted cerumen.     Ears:     Comments: Status post removal of cerumen plugs notes tympanic membranes in normal position.    Nose: Nose normal. No congestion or rhinorrhea.     Mouth/Throat:     Mouth: Mucous membranes are moist.     Pharynx: No oropharyngeal exudate or posterior oropharyngeal erythema.  Eyes:     General:        Right eye: No discharge.        Left eye: No discharge.     Conjunctiva/sclera: Conjunctivae normal.     Pupils: Pupils are equal, round, and reactive to light.  Neck:     Thyroid: No thyromegaly.     Vascular: No JVD.  Cardiovascular:     Rate and Rhythm: Regular rhythm. Tachycardia present.     Heart sounds: Normal heart sounds. No murmur  heard.    No friction rub. No gallop.  Pulmonary:     Effort: Pulmonary effort is normal. No respiratory distress.     Breath sounds: Normal breath sounds. No stridor. No wheezing, rhonchi  or rales.  Chest:     Chest wall: No tenderness.  Abdominal:     General: There is no distension.     Palpations: There is no mass.     Tenderness: There is no abdominal tenderness. There is no guarding or rebound.     Hernia: No hernia is present.  Musculoskeletal:     Cervical back: Neck supple.  Lymphadenopathy:     Cervical: No cervical adenopathy.  Skin:    General: Skin is warm.     Findings: No bruising or erythema.  Neurological:     Mental Status: She is alert.     Deep Tendon Reflexes: Reflexes are normal and symmetric.     Wt Readings from Last 3 Encounters:  09/23/23 147 lb (66.7 kg)  08/18/23 146 lb (66.2 kg)  06/29/23 147 lb (66.7 kg)    BP 128/70   Pulse 84   Ht 5\' 5"  (1.651 m)   Wt 147 lb (66.7 kg)   LMP 09/09/2023   SpO2 97%   BMI 24.46 kg/m   Assessment and Plan: 1. Bilateral impacted cerumen New onset.  Persistent.  Stable.  Bilateral cerumen plugs noted in these were easily removed with gentle irrigation of warm water.  Tympanic membranes are in neutral position with no evidence of serous or otitis media.    Elizabeth Sauer, MD

## 2023-09-27 ENCOUNTER — Ambulatory Visit: Payer: BC Managed Care – PPO

## 2023-09-28 ENCOUNTER — Telehealth: Payer: Self-pay

## 2023-09-28 NOTE — Telephone Encounter (Signed)
Pt called again and requested to speak to Evie. She needs prior authorization for her mri at [facility] and they have an opening tomorrow afternoon and she is asking if there was any way that you could get this for her so can get the mri tomorrow please.

## 2023-09-28 NOTE — Telephone Encounter (Signed)
This patient called and left a message - Her MRI was cancelled 9/30 - Needs PA  through [facility] before she can reschedule

## 2023-09-29 ENCOUNTER — Ambulatory Visit
Admission: RE | Admit: 2023-09-29 | Discharge: 2023-09-29 | Disposition: A | Discharge status: Home / Self Care | Payer: BC Managed Care – PPO | Source: Ambulatory Visit | Attending: Podiatry | Admitting: Podiatry

## 2023-09-29 ENCOUNTER — Telehealth: Payer: Self-pay

## 2023-09-29 DIAGNOSIS — M19072 Primary osteoarthritis, left ankle and foot: Secondary | ICD-10-CM | POA: Diagnosis not present

## 2023-09-29 DIAGNOSIS — S99922A Unspecified injury of left foot, initial encounter: Secondary | ICD-10-CM | POA: Diagnosis not present

## 2023-09-29 NOTE — Telephone Encounter (Signed)
Patient called again this morning in regards to MRI. MRI is scheduled for 5om this afternoon and a Peer to peer needs to be done.   Me  to Elinor Parkinson, DPM  [place], [employer], [facility]      09/28/23 11:51 AM I called [employer] and was routed to [employer] ([employer] for insurance) and there needs to be a peer to peer done The number is [phone]. Also [facility] is out of network for the patient.The order number for this case is [id number]

## 2023-09-29 NOTE — Telephone Encounter (Signed)
Dr. Al Corpus called for this P2P and the same reference number that is in the message here, is the same number they gave him as the authorization number. He said it was already authorized, so I'm not sure what is happening. But, if you want to relay the information to [facility]. Thanks!!!  Berkley Harvey #[id number]

## 2023-10-05 NOTE — Progress Notes (Unsigned)
  [facility] [address] [place] [address] Phone: [phone] Subjective:   Stephanie Mcguire, am serving as a scribe for Dr. Antoine Primas.  I'm seeing this patient by the request  of:  Duanne Limerick, MD  CC: Back and neck pain follow-up  [id number]  Stephanie Mcguire is a 52 y.o. female coming in with complaint of back and neck pain. OMT 08/18/2023. Patient states same per usual. No new concerns.  Medications patient has been prescribed: None  Taking:       Past Medical History:  Diagnosis Date   Allergy    Anxiety    Aphthous ulcer    Arthritis    spondylosis   Asthma    Bipolar 2 disorder (HCC)    Bipolar disease, chronic (HCC)    Chest pain    Depression    DOE (dyspnea on exertion)    Elevated cholesterol 2000   GERD (gastroesophageal reflux disease)    Hyperlipidemia    Thyroid disease     Allergies  Allergen Reactions   Gluten Meal     Other Reaction(s): Other (See Comments)  Headache GI upset     Objective  Blood pressure 130/84, pulse (!) 110, height 5\' 5"  (1.651 m), weight 147 lb (66.7 kg), last menstrual period 09/09/2023, SpO2 95%.   General: No apparent distress alert and oriented x3 mood and affect normal, dressed appropriately.  HEENT: Pupils equal, extraocular movements intact  Respiratory: Patient's speak in full sentences and does not appear short of breath  Cardiovascular: No lower extremity edema, non tender, no erythema  Low back continues to have loss of lordosis and tightness noted.  Increasing tightness noted noted over the scapular area as well as over the left side of the neck.  Patient does have some scapular dyskinesis noted.  Osteopathic findings  C2 flexed rotated and side bent right C6 flexed rotated and side bent left T3 extended rotated and side bent left inhaled rib T9 extended rotated and side bent left L2 flexed rotated and side bent right L3 flexed rotated and side bent  left L4 flexed rotated and side bent right L5 flexed rotated and side bent left Sacrum right on right       Assessment and Plan:  Scapular dyskinesis Chronic problem with exacerbation.  Discussed icing regimen and home exercises, discussed which activities to do and which ones to avoid.  Increase activity slowly.  Follow-up with me again in 6 to 8 weeks otherwise.  Patient still has some anxiety that I think is producing some of the discomfort and pain as well.    Nonallopathic problems  Decision today to treat with OMT was based on Physical Exam  After verbal consent patient was treated with HVLA, ME, FPR techniques in cervical, rib, thoracic, lumbar, and sacral  areas  Patient tolerated the procedure well with improvement in symptoms  Patient given exercises, stretches and lifestyle modifications  See medications in patient instructions if given  Patient will follow up in 4-8 weeks      The above documentation has been reviewed and is accurate and complete Judi Saa, DO        Note: This dictation was prepared with Dragon dictation along with smaller phrase technology. Any transcriptional errors that result from this process are unintentional.

## 2023-10-06 ENCOUNTER — Encounter: Payer: Self-pay | Admitting: Family Medicine

## 2023-10-06 ENCOUNTER — Ambulatory Visit: Payer: BC Managed Care – PPO | Admitting: Podiatry

## 2023-10-06 ENCOUNTER — Ambulatory Visit: Payer: BC Managed Care – PPO | Admitting: Family Medicine

## 2023-10-06 VITALS — BP 130/84 | HR 110 | Ht 65.0 in | Wt 147.0 lb

## 2023-10-06 DIAGNOSIS — M9904 Segmental and somatic dysfunction of sacral region: Secondary | ICD-10-CM

## 2023-10-06 DIAGNOSIS — G2589 Other specified extrapyramidal and movement disorders: Secondary | ICD-10-CM

## 2023-10-06 DIAGNOSIS — M9902 Segmental and somatic dysfunction of thoracic region: Secondary | ICD-10-CM

## 2023-10-06 DIAGNOSIS — M9903 Segmental and somatic dysfunction of lumbar region: Secondary | ICD-10-CM

## 2023-10-06 DIAGNOSIS — M9908 Segmental and somatic dysfunction of rib cage: Secondary | ICD-10-CM

## 2023-10-06 DIAGNOSIS — M9901 Segmental and somatic dysfunction of cervical region: Secondary | ICD-10-CM

## 2023-10-06 NOTE — Assessment & Plan Note (Signed)
Chronic problem with exacerbation.  Discussed icing regimen and home exercises, discussed which activities to do and which ones to avoid.  Increase activity slowly.  Follow-up with me again in 6 to 8 weeks otherwise.  Patient still has some anxiety that I think is producing some of the discomfort and pain as well.

## 2023-10-06 NOTE — Patient Instructions (Signed)
Good to see you! Please do your homework See you again in 6 weeks

## 2023-10-15 DIAGNOSIS — Z23 Encounter for immunization: Secondary | ICD-10-CM | POA: Diagnosis not present

## 2023-10-20 ENCOUNTER — Telehealth: Payer: Self-pay | Admitting: Podiatry

## 2023-10-20 NOTE — Telephone Encounter (Signed)
Pt called  imaging about her mri results from 10/2 and they recommended the providers office call them and they can transfer to the radiologist about the results.

## 2023-10-27 ENCOUNTER — Encounter: Payer: Self-pay | Admitting: Podiatry

## 2023-10-27 ENCOUNTER — Ambulatory Visit (INDEPENDENT_AMBULATORY_CARE_PROVIDER_SITE_OTHER): Payer: BC Managed Care – PPO | Admitting: Podiatry

## 2023-10-27 DIAGNOSIS — M2012 Hallux valgus (acquired), left foot: Secondary | ICD-10-CM | POA: Diagnosis not present

## 2023-10-27 NOTE — Progress Notes (Signed)
She presents today for follow-up of her MRI for her left first metatarsal phalangeal joint.  She states that the toe was better for a couple of days after steroid flare when I injected her last visit.  There was intra-articular injection she said heard like crazy for a few days that he felt better for just a few days.  She states that but now is pretty much back to the way it was.  States that she had to have the other foot surgically corrected years ago.  Objective: Vital signs are stable alert and oriented x 3.  Pulses are palpable.  I reviewed her MRI and her radiographs with her today.  I does demonstrate what appears to be a small dorsal fracture of the proximal phalanx of her left foot.  The joint is mildly abnormally shaped and does have a hypertrophic medial condyle.  MRI does demonstrate a tear of the medial suspensory ligament of the tibial sesamoid.  She does have crepitation on range of motion with significant pain on palpation creating the crepitus.  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  Plan: Discussed etiology pathology conservative surgical therapies at this point based on clinical symptomatology and findings of the MRI and radiographs surgery is indicated performing Eliberto Ivory or McBride bunion repair.  I would like to go ahead and consent her for the Golden Hurter may need to be converted to an Dewaine Conger if there is significant arthritic change.  We discussed possible postop complications associated with this she understands and is amenable to it.  I will follow-up with her in the near future for a McBride possible Austin osteotomy of her left foot.

## 2023-11-01 DIAGNOSIS — D2261 Melanocytic nevi of right upper limb, including shoulder: Secondary | ICD-10-CM | POA: Diagnosis not present

## 2023-11-01 DIAGNOSIS — D2271 Melanocytic nevi of right lower limb, including hip: Secondary | ICD-10-CM | POA: Diagnosis not present

## 2023-11-01 DIAGNOSIS — D2262 Melanocytic nevi of left upper limb, including shoulder: Secondary | ICD-10-CM | POA: Diagnosis not present

## 2023-11-01 DIAGNOSIS — D225 Melanocytic nevi of trunk: Secondary | ICD-10-CM | POA: Diagnosis not present

## 2023-11-05 ENCOUNTER — Encounter: Payer: Self-pay | Admitting: Podiatry

## 2023-11-08 ENCOUNTER — Telehealth: Payer: Self-pay | Admitting: Podiatry

## 2023-11-08 NOTE — Telephone Encounter (Signed)
CALLED PTS INSURANCE ON BEHALF OF THE PATIENT DUE TO A DENIAL RECEIVED REGARDING PTS 11/12/2023 SURGERY. A PEER TO PEER HAS BEEN SET UP FOR [facility] 11/10/2023 @ 11:45AM, BETWEEN DR.HYATT AND ONE OF THE DOCTORS FROM THE INSURANCE COMPANY (DR.FOX).

## 2023-11-10 ENCOUNTER — Encounter: Payer: Self-pay | Admitting: Podiatry

## 2023-11-10 ENCOUNTER — Telehealth: Payer: Self-pay | Admitting: Podiatry

## 2023-11-10 ENCOUNTER — Other Ambulatory Visit: Payer: Self-pay | Admitting: Podiatry

## 2023-11-10 MED ORDER — CEPHALEXIN 500 MG PO CAPS: 500.0000 mg | ORAL_CAPSULE | Freq: Three times a day (TID) | ORAL | 0 refills | Status: DC

## 2023-11-10 MED ORDER — ONDANSETRON HCL 4 MG PO TABS: 4.0000 mg | ORAL_TABLET | Freq: Three times a day (TID) | ORAL | 0 refills | Status: DC | PRN

## 2023-11-10 MED ORDER — OXYCODONE-ACETAMINOPHEN 10-325 MG PO TABS: 1.0000 | ORAL_TABLET | Freq: Three times a day (TID) | ORAL | 0 refills | Status: AC | PRN

## 2023-11-10 NOTE — Telephone Encounter (Signed)
DOS- 11/12/2023  [place] BUNIONECTOMY [id number] MCBRIDE BUNIONECTOMY [id number]  [employer] EFFECTIVE DATE-12/28/2018  DEDUCTIBLE-$4000.00 WITH REMAINING $0.00 OOP-$8000.00 WITH REMAINING $0.00 COINSURANCE- 0%  POST A PEER TO PEER, PER THE CARELON PORTAL PRIOR AUTH HAS BEEN APPROVED FOR CPT CODES 01027 AND [phone]. GOOD FROM 11/12/2023 - 01/10/2024  AUTH REFERENCE NUMBER: Order ID: [id number] ACMP #: [id number]

## 2023-11-12 DIAGNOSIS — M2012 Hallux valgus (acquired), left foot: Secondary | ICD-10-CM | POA: Diagnosis not present

## 2023-11-12 DIAGNOSIS — M2010 Hallux valgus (acquired), unspecified foot: Secondary | ICD-10-CM | POA: Diagnosis not present

## 2023-11-12 DIAGNOSIS — M21612 Bunion of left foot: Secondary | ICD-10-CM | POA: Diagnosis not present

## 2023-11-12 DIAGNOSIS — G8918 Other acute postprocedural pain: Secondary | ICD-10-CM | POA: Diagnosis not present

## 2023-11-14 ENCOUNTER — Encounter: Payer: Self-pay | Admitting: Podiatry

## 2023-11-15 ENCOUNTER — Ambulatory Visit: Payer: BC Managed Care – PPO | Admitting: Family Medicine

## 2023-11-16 ENCOUNTER — Other Ambulatory Visit: Payer: Self-pay | Admitting: Internal Medicine

## 2023-11-17 ENCOUNTER — Ambulatory Visit (INDEPENDENT_AMBULATORY_CARE_PROVIDER_SITE_OTHER): Payer: BC Managed Care – PPO

## 2023-11-17 ENCOUNTER — Ambulatory Visit (INDEPENDENT_AMBULATORY_CARE_PROVIDER_SITE_OTHER): Payer: BC Managed Care – PPO | Admitting: Podiatry

## 2023-11-17 ENCOUNTER — Encounter: Payer: Self-pay | Admitting: Podiatry

## 2023-11-17 DIAGNOSIS — M2012 Hallux valgus (acquired), left foot: Secondary | ICD-10-CM

## 2023-11-17 DIAGNOSIS — Z9889 Other specified postprocedural states: Secondary | ICD-10-CM

## 2023-11-17 NOTE — Progress Notes (Signed)
She presents today postop visit #1 date of surgery November 12, 2023.  Status post [facility] bunion repair left foot.  Objective: Vital signs are stable alert oriented x 3.  Pulses are palpable.  There is no erythema edema salines drainage or odor once the dressing was removed.  Incision site appears to be healing very nicely she is good range of motion passively and actively.  Radiographs taken today demonstrate capital osteotomy of the first metatarsal with screw in good position.  Assessment: Well-healing surgical foot redress today dressed a compressive dressing continue use of cam boot discussed exercising and will follow-up with her in 2 weeks.

## 2023-11-18 ENCOUNTER — Ambulatory Visit: Payer: BC Managed Care – PPO | Admitting: Family Medicine

## 2023-11-19 ENCOUNTER — Ambulatory Visit: Payer: BC Managed Care – PPO | Admitting: Family Medicine

## 2023-12-01 ENCOUNTER — Ambulatory Visit (INDEPENDENT_AMBULATORY_CARE_PROVIDER_SITE_OTHER): Payer: BC Managed Care – PPO | Admitting: Podiatry

## 2023-12-01 ENCOUNTER — Encounter: Payer: Self-pay | Admitting: Podiatry

## 2023-12-01 DIAGNOSIS — M2012 Hallux valgus (acquired), left foot: Secondary | ICD-10-CM

## 2023-12-01 DIAGNOSIS — Z9889 Other specified postprocedural states: Secondary | ICD-10-CM

## 2023-12-01 NOTE — Progress Notes (Signed)
She presents today for her second postop visit date of surgery is 11/12/2023.  Status post [facility] bunion repair left foot.  States that is still quite sore but has improved during the daytime.  Nighttime seems to be the worst with throbbing and radiating pain.  Objective: Vital signs are stable alert and oriented x 3 slightly nauseous today with ambulation and range of motion of the first metatarsal phalangeal joint.  Minimal edema no erythema cellulitis drainage or odor suture ends were removed today margins remain well coapted.  Assessment: Well-healing surgical foot on appearance though tender on range of motion.  Plan: We will place him in a Darco shoe and a compression anklet I am going to allow her to start taking 300 mg of gabapentin at nighttime which she already has a time should she need a refill on the prescription we will do that for her.  I will follow-up with her in 2 weeks for another set of x-rays.

## 2023-12-02 ENCOUNTER — Other Ambulatory Visit: Payer: Self-pay | Admitting: Podiatry

## 2023-12-02 ENCOUNTER — Telehealth: Payer: Self-pay | Admitting: Podiatry

## 2023-12-02 MED ORDER — GABAPENTIN 300 MG PO CAPS: 300.0000 mg | ORAL_CAPSULE | Freq: Three times a day (TID) | ORAL | 3 refills | Status: AC

## 2023-12-02 NOTE — Telephone Encounter (Signed)
Pt called. She states she talked with Dr Al Corpus about Gabapentin yesterday. She would like a rx of that sent in to pharmacy.

## 2023-12-07 NOTE — Progress Notes (Unsigned)
[facility] [address] [place] [address] Phone: [phone] Subjective:   Stephanie Mcguire, am serving as a scribe for Dr. Antoine Primas.  I'm seeing this patient by the request  of:  Duanne Limerick, MD  CC: Back pain follow-up  [id number]  Stephanie Mcguire is a 52 y.o. female coming in with complaint of back and neck pain. OMT 10/06/2023. Patient states same per usual. No new concerns.  Patient has been doing relatively well overall but does have some tightness noted.  Medications patient has been prescribed: None  Taking:         Reviewed prior external information including notes and imaging from previsou exam, outside providers and external EMR if available.   As well as notes that were available from care everywhere and other healthcare systems.  Past medical history, social, surgical and family history all reviewed in electronic medical record.  No pertanent information unless stated regarding to the chief complaint.   Past Medical History:  Diagnosis Date   Allergy    Anxiety    Aphthous ulcer    Arthritis    spondylosis   Asthma    Bipolar 2 disorder (HCC)    Bipolar disease, chronic (HCC)    Chest pain    Depression    DOE (dyspnea on exertion)    Elevated cholesterol 2000   GERD (gastroesophageal reflux disease)    Hyperlipidemia    Thyroid disease     Allergies  Allergen Reactions   Gluten Meal     Other Reaction(s): Other (See Comments)  Headache GI upset     Review of Systems:  No headache, visual changes, nausea, vomiting, diarrhea, constipation, dizziness, abdominal pain, skin rash, fevers, chills, night sweats, weight loss, swollen lymph nodes, body aches, joint swelling, chest pain, shortness of breath, mood changes. POSITIVE muscle aches  Objective  Blood pressure 124/84, pulse (!) 102, height 5\' 5"  (1.651 m), weight 149 lb (67.6 kg), SpO2 97%.   General: No apparent distress alert and oriented  x3 mood and affect normal, dressed appropriately.  HEENT: Pupils equal, extraocular movements intact  Respiratory: Patient's speak in full sentences and does not appear short of breath  Cardiovascular: No lower extremity edema, non tender, no erythema  Back exam does have some loss lordosis noted.  Some tenderness to palpation more in the  Osteopathic findings  C3 flexed rotated and side bent right C6 flexed rotated and side bent left T3 extended rotated and side bent right inhaled rib T7 extended rotated and side bent left L3 flexed rotated and side bent left Sacrum right on right     Assessment and Plan:  Lumbar radiculopathy Discussed icing regimen and home exercises.  Discussed avoiding certain activities.  Discussed posture and ergonomics.  Follow-up again in 6 to 8 weeks    Nonallopathic problems  Decision today to treat with OMT was based on Physical Exam  After verbal consent patient was treated with HVLA, ME, FPR techniques in cervical, rib, thoracic, lumbar, and sacral  areas  Patient tolerated the procedure well with improvement in symptoms  Patient given exercises, stretches and lifestyle modifications  See medications in patient instructions if given  Patient will follow up in 4-8 weeks    The above documentation has been reviewed and is accurate and complete Judi Saa, DO          Note: This dictation was prepared with Dragon dictation along with smaller phrase technology. Any  transcriptional errors that result from this process are unintentional.

## 2023-12-08 ENCOUNTER — Ambulatory Visit: Payer: BC Managed Care – PPO | Admitting: Family Medicine

## 2023-12-08 ENCOUNTER — Encounter: Payer: Self-pay | Admitting: Family Medicine

## 2023-12-08 VITALS — BP 124/84 | HR 102 | Ht 65.0 in | Wt 149.0 lb

## 2023-12-08 DIAGNOSIS — M9903 Segmental and somatic dysfunction of lumbar region: Secondary | ICD-10-CM | POA: Diagnosis not present

## 2023-12-08 DIAGNOSIS — M5416 Radiculopathy, lumbar region: Secondary | ICD-10-CM

## 2023-12-08 DIAGNOSIS — M9908 Segmental and somatic dysfunction of rib cage: Secondary | ICD-10-CM

## 2023-12-08 DIAGNOSIS — M9901 Segmental and somatic dysfunction of cervical region: Secondary | ICD-10-CM

## 2023-12-08 DIAGNOSIS — M9904 Segmental and somatic dysfunction of sacral region: Secondary | ICD-10-CM

## 2023-12-08 DIAGNOSIS — M9902 Segmental and somatic dysfunction of thoracic region: Secondary | ICD-10-CM

## 2023-12-08 NOTE — Assessment & Plan Note (Signed)
Discussed icing regimen and home exercises.  Discussed avoiding certain activities.  Discussed posture and ergonomics.  Follow-up again in 6 to 8 weeks

## 2023-12-08 NOTE — Patient Instructions (Addendum)
Good to see you! Enjoy the beach See you again in 6 weeks

## 2023-12-09 DIAGNOSIS — N946 Dysmenorrhea, unspecified: Secondary | ICD-10-CM | POA: Diagnosis not present

## 2023-12-09 DIAGNOSIS — J849 Interstitial pulmonary disease, unspecified: Secondary | ICD-10-CM | POA: Diagnosis not present

## 2023-12-09 DIAGNOSIS — F3181 Bipolar II disorder: Secondary | ICD-10-CM | POA: Diagnosis not present

## 2023-12-09 DIAGNOSIS — E031 Congenital hypothyroidism without goiter: Secondary | ICD-10-CM | POA: Diagnosis not present

## 2023-12-13 DIAGNOSIS — F3176 Bipolar disorder, in full remission, most recent episode depressed: Secondary | ICD-10-CM | POA: Diagnosis not present

## 2023-12-13 DIAGNOSIS — F410 Panic disorder [episodic paroxysmal anxiety] without agoraphobia: Secondary | ICD-10-CM | POA: Diagnosis not present

## 2023-12-13 DIAGNOSIS — F3174 Bipolar disorder, in full remission, most recent episode manic: Secondary | ICD-10-CM | POA: Diagnosis not present

## 2023-12-15 ENCOUNTER — Encounter: Payer: Self-pay | Admitting: Podiatry

## 2023-12-15 ENCOUNTER — Ambulatory Visit (INDEPENDENT_AMBULATORY_CARE_PROVIDER_SITE_OTHER): Payer: BC Managed Care – PPO

## 2023-12-15 ENCOUNTER — Ambulatory Visit (INDEPENDENT_AMBULATORY_CARE_PROVIDER_SITE_OTHER): Payer: BC Managed Care – PPO | Admitting: Podiatry

## 2023-12-15 DIAGNOSIS — M2012 Hallux valgus (acquired), left foot: Secondary | ICD-10-CM

## 2023-12-15 DIAGNOSIS — Z9889 Other specified postprocedural states: Secondary | ICD-10-CM

## 2023-12-15 NOTE — Progress Notes (Signed)
She presents today for follow-up of her [facility].  Date of surgery November 12, 2023.  [place] bunion repair.  She states that I think I turned the corner just last week.  States that is starting to feel better and I am able to get around better.  Objective: She is stable alert and oriented walking with her Darco shoe nonantalgic gait.  Foot appears to be nearly normal minimal edema no erythema cellulitis drainage or odor still has less than a full range of motion of the first metatarsal phalangeal joint.  Radiographically appears to be healing very nicely.  It appears that the screw has retrograded slightly but no fractures are any significant findings otherwise.  Assessment well-healing surgical foot.  Plan: Continue use of the Darco shoe for another 2 to 3 weeks then we will transition to a tennis shoe.  She will be leaving for [employer] in a few days.  I will follow-up with her when she returns.

## 2023-12-27 ENCOUNTER — Encounter: Payer: BC Managed Care – PPO | Admitting: Podiatry

## 2024-01-03 DIAGNOSIS — U071 COVID-19: Secondary | ICD-10-CM | POA: Diagnosis not present

## 2024-01-05 ENCOUNTER — Encounter: Payer: BC Managed Care – PPO | Admitting: Podiatry

## 2024-01-05 ENCOUNTER — Telehealth: Payer: Self-pay | Admitting: Podiatry

## 2024-01-05 NOTE — Telephone Encounter (Signed)
 Called pt to rsc due to Dr Al Corpus out of office. Patient fine with reschedule but had some questions about boot wearing etc. Please call pt to discuss questions.

## 2024-01-05 NOTE — Telephone Encounter (Signed)
 Called Stephanie Mcguire to rsc due to Dr Al Corpus out of office Stephanie Mcguire fine with reschedule but she has some questions in the meantime. Please call Stephanie Mcguire to answer all questions.

## 2024-01-18 DIAGNOSIS — L308 Other specified dermatitis: Secondary | ICD-10-CM | POA: Diagnosis not present

## 2024-01-18 DIAGNOSIS — L309 Dermatitis, unspecified: Secondary | ICD-10-CM | POA: Diagnosis not present

## 2024-01-19 ENCOUNTER — Encounter: Payer: BC Managed Care – PPO | Admitting: Podiatry

## 2024-01-19 ENCOUNTER — Ambulatory Visit (INDEPENDENT_AMBULATORY_CARE_PROVIDER_SITE_OTHER): Payer: BC Managed Care – PPO

## 2024-01-19 ENCOUNTER — Encounter: Payer: Self-pay | Admitting: Podiatry

## 2024-01-19 ENCOUNTER — Ambulatory Visit (INDEPENDENT_AMBULATORY_CARE_PROVIDER_SITE_OTHER): Payer: BC Managed Care – PPO | Admitting: Podiatry

## 2024-01-19 DIAGNOSIS — Z9889 Other specified postprocedural states: Secondary | ICD-10-CM | POA: Diagnosis not present

## 2024-01-19 NOTE — Progress Notes (Signed)
[name] [address] [place] [address] Phone: [phone] Subjective:   Bruce Donath, am serving as a scribe for Dr. Antoine Primas.  I'm seeing this patient by the request  of:  Duanne Limerick, MD  CC: Neck exam, upper back pain  [id number]  Stephanie Mcguire is a 53 y.o. female coming in with complaint of back and neck pain. OMT on 12/08/2023. Patient states some increasing stress recently.  Patient was in [place] and did get COVID where she was hospitalized.  Feels like she is doing better at the moment.  Medications patient has been prescribed:   Taking:         Reviewed prior external information including notes and imaging from previsou exam, outside providers and external EMR if available.   As well as notes that were available from care everywhere and other healthcare systems.  Past medical history, social, surgical and family history all reviewed in electronic medical record.  No pertanent information unless stated regarding to the chief complaint.   Past Medical History:  Diagnosis Date   Allergy    Anxiety    Aphthous ulcer    Arthritis    spondylosis   Asthma    Bipolar 2 disorder (HCC)    Bipolar disease, chronic (HCC)    Chest pain    Depression    DOE (dyspnea on exertion)    Elevated cholesterol 2000   GERD (gastroesophageal reflux disease)    Hyperlipidemia    Thyroid disease     Allergies  Allergen Reactions   Gluten Meal     Other Reaction(s): Other (See Comments)  Headache GI upset     Review of Systems:  No headache, visual changes, nausea, vomiting, diarrhea, constipation, dizziness, abdominal pain, skin rash, fevers, chills, night sweats, weight loss, swollen lymph nodes, body aches, joint swelling, chest pain, shortness of breath, mood changes. POSITIVE muscle aches  Objective  Blood pressure 118/82, pulse (!) 57, height 5\' 5"  (1.651 m), weight 145 lb (65.8 kg), last menstrual period  11/19/2023, SpO2 98%.   General: No apparent distress alert and oriented x3 mood and affect normal, dressed appropriately.  HEENT: Pupils equal, extraocular movements intact  Respiratory: Patient's speak in full sentences and does not appear short of breath  Cardiovascular: No lower extremity edema, non tender, no erythema  Gait MSK:  Back does have some loss lordosis noted.  Tightness with Pearlean Brownie right greater than left.  Significant tightness noted in the right side of the scapular area.  Osteopathic findings  C2 flexed rotated and side bent right C6 flexed rotated and side bent right  T3 extended rotated and side bent right inhaled rib T9 extended rotated and side bent right L2 flexed rotated and side bent right Sacrum right on right       Assessment and Plan:  Scapular dyskinesis Scapular dyskinesis noted.  Discussed icing regimen and home exercises.  I do think that stress is probably playing a role.  Patient will continue to increase activity as slowly.  Discussed icing regimen and home exercises, which activities to do and which ones to avoid.  Follow-up again in 6 to 8 weeks    Nonallopathic problems  Decision today to treat with OMT was based on Physical Exam  After verbal consent patient was treated with HVLA, ME, FPR techniques in cervical, rib, thoracic, lumbar, and sacral  areas  Patient tolerated the procedure well with improvement in symptoms  Patient given exercises,  stretches and lifestyle modifications  See medications in patient instructions if given  Patient will follow up in 4-8 weeks    The above documentation has been reviewed and is accurate and complete Judi Saa, DO          Note: This dictation was prepared with Dragon dictation along with smaller phrase technology. Any transcriptional errors that result from this process are unintentional.

## 2024-01-20 NOTE — Progress Notes (Signed)
  Subjective:  Patient ID: Stephanie Mcguire, female    DOB: 29-Jan-1971,  MRN: [id number]  Chief Complaint  Patient presents with   Routine Post Op    POV DOS 11/12/2023  [place] BUNIONECTOMY LEFT "I feel like I'm finally making progress."    DOS: 11/12/2023 Procedure: Left foot ethmoidectomy  53 y.o. female returns for post-op check.  She is doing well she has returned to regular shoe gear  Review of Systems: Negative except as noted in the HPI. Denies N/V/F/Ch.   Objective:  There were no vitals filed for this visit. There is no height or weight on file to calculate BMI. Constitutional Well developed. Well nourished.  Vascular Foot warm and well perfused. Capillary refill normal to all digits.  Calf is soft and supple, no posterior calf or knee pain, negative Homans' sign  Neurologic Normal speech. Oriented to person, place, and time. Epicritic sensation to light touch grossly present bilaterally.  Dermatologic Incision well-healed minimal hypertrophy  Orthopedic: No pain to palpation noted about the surgical site.   Multiple view plain film radiographs: Good healing across osteotomy site Assessment:   1. Status post left foot surgery    Plan:  Patient was evaluated and treated and all questions answered.  S/p foot surgery left -Progressing as expected post-operatively.  Doing very well and may be full weightbearing and full activity and shoe gear.  Okay to begin low impact exercise.  Return to see Dr. Al Corpus in 2 months for follow-up, may follow-up as needed if she is doing well with no pain by then   No follow-ups on file.

## 2024-01-21 ENCOUNTER — Ambulatory Visit (INDEPENDENT_AMBULATORY_CARE_PROVIDER_SITE_OTHER): Payer: BC Managed Care – PPO | Admitting: Family Medicine

## 2024-01-21 ENCOUNTER — Encounter: Payer: Self-pay | Admitting: Family Medicine

## 2024-01-21 VITALS — BP 118/82 | HR 57 | Ht 65.0 in | Wt 145.0 lb

## 2024-01-21 DIAGNOSIS — M9903 Segmental and somatic dysfunction of lumbar region: Secondary | ICD-10-CM | POA: Diagnosis not present

## 2024-01-21 DIAGNOSIS — M9902 Segmental and somatic dysfunction of thoracic region: Secondary | ICD-10-CM | POA: Diagnosis not present

## 2024-01-21 DIAGNOSIS — G2589 Other specified extrapyramidal and movement disorders: Secondary | ICD-10-CM

## 2024-01-21 DIAGNOSIS — M9908 Segmental and somatic dysfunction of rib cage: Secondary | ICD-10-CM | POA: Diagnosis not present

## 2024-01-21 DIAGNOSIS — M9901 Segmental and somatic dysfunction of cervical region: Secondary | ICD-10-CM | POA: Diagnosis not present

## 2024-01-21 DIAGNOSIS — M9904 Segmental and somatic dysfunction of sacral region: Secondary | ICD-10-CM

## 2024-01-21 NOTE — Patient Instructions (Signed)
Good to see you  Ice 20 minutes 2 times daily. Usually after activity and before bed. Keep on trying but know we are here  Keep working on you and take time for you  See me again in 6-8 weeks

## 2024-01-21 NOTE — Assessment & Plan Note (Signed)
Scapular dyskinesis noted.  Discussed icing regimen and home exercises.  I do think that stress is probably playing a role.  Patient will continue to increase activity as slowly.  Discussed icing regimen and home exercises, which activities to do and which ones to avoid.  Follow-up again in 6 to 8 weeks

## 2024-02-29 NOTE — Progress Notes (Unsigned)
 [name] [address] [place] [address] Phone: [phone] Subjective:   Bruce Donath, am serving as a scribe for Dr. Antoine Primas.  I'm seeing this patient by the request  of:  Duanne Limerick, MD  CC: Back and neck pain follow-up  [id number]  Stephanie Mcguire is a 53 y.o. female coming in with complaint of back and neck pain. OMT 01/21/2024. Patient states that her back and neck are doing the same since last visit.   Pain in TMJ on L side with eating and yawning. Jaw surgery in her 40s.   Also c/o pain in R wrist and forearm. Bicep will sometimes ache. Not working out and has not injured it around the house. Denies any tingling. Weakness in hand.   Medications patient has been prescribed: None  Taking:         Reviewed prior external information including notes and imaging from previsou exam, outside providers and external EMR if available.   As well as notes that were available from care everywhere and other healthcare systems.  Past medical history, social, surgical and family history all reviewed in electronic medical record.  No pertanent information unless stated regarding to the chief complaint.   Past Medical History:  Diagnosis Date   Allergy    Anxiety    Aphthous ulcer    Arthritis    spondylosis   Asthma    Bipolar 2 disorder (HCC)    Bipolar disease, chronic (HCC)    Chest pain    Depression    DOE (dyspnea on exertion)    Elevated cholesterol 2000   GERD (gastroesophageal reflux disease)    Hyperlipidemia    Thyroid disease     Allergies  Allergen Reactions   Gluten Meal     Other Reaction(s): Other (See Comments)  Headache GI upset     Review of Systems:  No headache, visual changes, nausea, vomiting, diarrhea, constipation, dizziness, abdominal pain, skin rash, fevers, chills, night sweats, weight loss, swollen lymph nodes, body aches, joint swelling, chest pain, shortness of breath, mood  changes. POSITIVE muscle aches  Objective  Blood pressure 120/84, pulse 74, height 5\' 5"  (1.651 m), weight 146 lb (66.2 kg), SpO2 98%.   General: No apparent distress alert and oriented x3 mood and affect normal, dressed appropriately.  HEENT: Pupils equal, extraocular movements intact  Respiratory: Patient's speak in full sentences and does not appear short of breath  Cardiovascular: No lower extremity edema, non tender, no erythema  Gait normal  MSK:  Back loss of lordosis tenderness to palpation diffusely.  Some tightness noted in the paraspinal musculature and seems to be some in the right parascapular area.  Osteopathic findings  C2 flexed rotated and side bent right C6 flexed rotated and side bent right T3 extended rotated and side bent right inhaled rib T9 extended rotated and side bent right with inhaled rib L1 flexed rotated and side bent right Sacrum right on right     Assessment and Plan:  Scapular dyskinesis Patient does have some scapular dyskinesis noted.  Some tenderness to palpation in the paraspinal musculature.  Patient does respond well to osteopathic manipulation.  No significant other changes noted at the moment.  Follow-up again in 6 to 8 weeks.  TMJ arthralgia Referred to the  dentistry to further evaluate    Nonallopathic problems  Decision today to treat with OMT was based on Physical Exam  After verbal consent patient was treated with  HVLA, ME, FPR techniques in cervical, rib, thoracic, lumbar, and sacral  areas  Patient tolerated the procedure well with improvement in symptoms  Patient given exercises, stretches and lifestyle modifications  See medications in patient instructions if given  Patient will follow up in 4-8 weeks    The above documentation has been reviewed and is accurate and complete Stephanie Saa, DO          Note: This dictation was prepared with Dragon dictation along with smaller phrase technology. Any  transcriptional errors that result from this process are unintentional.

## 2024-03-01 ENCOUNTER — Encounter: Payer: Self-pay | Admitting: Family Medicine

## 2024-03-01 ENCOUNTER — Ambulatory Visit (INDEPENDENT_AMBULATORY_CARE_PROVIDER_SITE_OTHER): Admitting: Family Medicine

## 2024-03-01 VITALS — BP 120/84 | HR 74 | Ht 65.0 in | Wt 146.0 lb

## 2024-03-01 DIAGNOSIS — M26629 Arthralgia of temporomandibular joint, unspecified side: Secondary | ICD-10-CM

## 2024-03-01 DIAGNOSIS — M9904 Segmental and somatic dysfunction of sacral region: Secondary | ICD-10-CM

## 2024-03-01 DIAGNOSIS — M9908 Segmental and somatic dysfunction of rib cage: Secondary | ICD-10-CM

## 2024-03-01 DIAGNOSIS — M9903 Segmental and somatic dysfunction of lumbar region: Secondary | ICD-10-CM

## 2024-03-01 DIAGNOSIS — M9901 Segmental and somatic dysfunction of cervical region: Secondary | ICD-10-CM

## 2024-03-01 DIAGNOSIS — M9902 Segmental and somatic dysfunction of thoracic region: Secondary | ICD-10-CM

## 2024-03-01 DIAGNOSIS — G2589 Other specified extrapyramidal and movement disorders: Secondary | ICD-10-CM | POA: Diagnosis not present

## 2024-03-01 NOTE — Patient Instructions (Signed)
 IBU 3 pills 3x a day for 3 days Stay active See me in 6-8 weeks

## 2024-03-01 NOTE — Assessment & Plan Note (Signed)
 Referred to the  dentistry to further evaluate

## 2024-03-01 NOTE — Assessment & Plan Note (Signed)
 Patient does have some scapular dyskinesis noted.  Some tenderness to palpation in the paraspinal musculature.  Patient does respond well to osteopathic manipulation.  No significant other changes noted at the moment.  Follow-up again in 6 to 8 weeks.

## 2024-03-03 ENCOUNTER — Ambulatory Visit: Payer: BC Managed Care – PPO | Admitting: Family Medicine

## 2024-03-20 ENCOUNTER — Other Ambulatory Visit: Payer: Self-pay | Admitting: Licensed Practical Nurse

## 2024-03-20 ENCOUNTER — Ambulatory Visit (INDEPENDENT_AMBULATORY_CARE_PROVIDER_SITE_OTHER)

## 2024-03-20 ENCOUNTER — Encounter: Payer: Self-pay | Admitting: Podiatry

## 2024-03-20 ENCOUNTER — Ambulatory Visit (INDEPENDENT_AMBULATORY_CARE_PROVIDER_SITE_OTHER): Payer: BC Managed Care – PPO | Admitting: Podiatry

## 2024-03-20 DIAGNOSIS — M2012 Hallux valgus (acquired), left foot: Secondary | ICD-10-CM

## 2024-03-20 DIAGNOSIS — Z01419 Encounter for gynecological examination (general) (routine) without abnormal findings: Secondary | ICD-10-CM

## 2024-03-20 DIAGNOSIS — Z9889 Other specified postprocedural states: Secondary | ICD-10-CM

## 2024-03-20 DIAGNOSIS — Z3041 Encounter for surveillance of contraceptive pills: Secondary | ICD-10-CM

## 2024-03-20 NOTE — Progress Notes (Signed)
 She presents today for a postop visit date of surgery November 12, 2023.  She is status post [place] bunionectomy left foot.  States that hurts a little bit depending on how a step on it and if stepped down hard on it.  Otherwise I think that is doing pretty well and becoming more normal.  Objective: Muscle and fibrous alert and x 3 pulses are palpable.  There is no erythema just some mild edema still present in the first intermetatarsal space and beneath the second third and fourth metatarsals.  Otherwise she has decent range of motion though it is not full it is nontender and has no crepitation.  Radiographs taken today demonstrate a well-healing Osteotomy left internal fixation is in good position and intact.  Assessment: Well-healing surgical toe and foot.  Plan: Follow-up with me on an as-needed basis.

## 2024-04-05 DIAGNOSIS — Z0184 Encounter for antibody response examination: Secondary | ICD-10-CM | POA: Diagnosis not present

## 2024-04-17 NOTE — Progress Notes (Unsigned)
 [facility] [address] [place] [address] Phone: [phone] Subjective:   Stephanie Mcguire am a scribe for Dr. Felipe Horton.   I'm seeing this patient by the request  of:  Clarise Crooks, MD  CC: back and neck pain follow up   [id number]  Stephanie Mcguire is a 53 y.o. female coming in with complaint of back and neck pain. OMT on 03/01/2024. Patient states back is not too bad. Right wrist has been giving her a lot of trouble since last appointment. Neck is pretty much the same. Having some soreness up and down the arm that isn't going away. This is concerning to the patient.   Medications patient has been prescribed: no  Taking: Advil  for wrist pain         Reviewed prior external information including notes and imaging from previsou exam, outside providers and external EMR if available.   As well as notes that were available from care everywhere and other healthcare systems.  Past medical history, social, surgical and family history all reviewed in electronic medical record.  No pertanent information unless stated regarding to the chief complaint.   Past Medical History:  Diagnosis Date   Allergy    Anxiety    Aphthous ulcer    Arthritis    spondylosis   Asthma    Bipolar 2 disorder (HCC)    Bipolar disease, chronic (HCC)    Chest pain    Depression    DOE (dyspnea on exertion)    Elevated cholesterol 2000   GERD (gastroesophageal reflux disease)    Hyperlipidemia    Thyroid  disease     Allergies  Allergen Reactions   Gluten Meal     Other Reaction(s): Other (See Comments)  Headache GI upset     Review of Systems:  No headache, visual changes, nausea, vomiting, diarrhea, constipation, dizziness, abdominal pain, skin rash, fevers, chills, night sweats, weight loss, swollen lymph nodes, body aches, joint swelling, chest pain, shortness of breath, mood changes. POSITIVE muscle aches  Objective  Blood pressure 138/70,  pulse 85, height 5\' 5"  (1.651 m), weight 148 lb 12.8 oz (67.5 kg), SpO2 98%.   General: No apparent distress alert and oriented x3 mood and affect normal, dressed appropriately.  HEENT: Pupils equal, extraocular movements intact  Respiratory: Patient's speak in full sentences and does not appear short of breath  Cardiovascular: No lower extremity edema, non tender, no erythema  Gait relatively normal MSK:  Back does have some loss lordosis.  Tightness noted more in the thoracolumbar juncture than usual. Wrist exam shows does have tenderness to palpation over the scaphoid lunate area.  Continues to have audible popping noted.  Osteopathic findings  C2 flexed rotated and side bent right C6 flexed rotated and side bent left T3 extended rotated and side bent right inhaled rib T11 extended rotated and side bent left L1 flexed rotated and side bent right L3 flexed rotated and side bent left Sacrum right on right  Limited muscular skeletal ultrasound was performed and interpreted by Ronnell Coins, M  Limited ultrasound shows some hypoechoic changes in the scaphoid lunate joint.  Difficult to assess the ligament area.  No true appreciated tear of the TFCC.   Assessment and Plan:  Scapular dyskinesis Scapular dyskinesis noted.  Discussed icing regimen of home exercises, which activities to do and which ones to avoid.  Increase activity slowly.  Discussed icing regimen.  Follow-up again in 6 to 8 weeks  Lunate-triquetrum synostosis Will continue to monitor.  Did get ultrasound today.  We did look at a ultrasound some hypoechoic changes.  Difficult to see the scaphoid lunate ligament.    Nonallopathic problems  Decision today to treat with OMT was based on Physical Exam  After verbal consent patient was treated with HVLA, ME, FPR techniques in cervical, rib, thoracic, lumbar, and sacral  areas  Patient tolerated the procedure well with improvement in symptoms  Patient given exercises,  stretches and lifestyle modifications  See medications in patient instructions if given  Patient will follow up in 4-8 weeks    The above documentation has been reviewed and is accurate and complete Stephanie Woodmansee M Markice Torbert, DO          Note: This dictation was prepared with Dragon dictation along with smaller phrase technology. Any transcriptional errors that result from this process are unintentional.

## 2024-04-19 ENCOUNTER — Encounter: Payer: Self-pay | Admitting: Licensed Practical Nurse

## 2024-04-19 ENCOUNTER — Ambulatory Visit: Admitting: Family Medicine

## 2024-04-19 ENCOUNTER — Encounter: Payer: Self-pay | Admitting: Family Medicine

## 2024-04-19 ENCOUNTER — Other Ambulatory Visit: Payer: Self-pay

## 2024-04-19 ENCOUNTER — Ambulatory Visit (INDEPENDENT_AMBULATORY_CARE_PROVIDER_SITE_OTHER): Admitting: Licensed Practical Nurse

## 2024-04-19 VITALS — BP 163/91 | HR 80 | Ht 65.0 in | Wt 147.1 lb

## 2024-04-19 VITALS — BP 138/70 | HR 85 | Ht 65.0 in | Wt 148.8 lb

## 2024-04-19 DIAGNOSIS — M9908 Segmental and somatic dysfunction of rib cage: Secondary | ICD-10-CM

## 2024-04-19 DIAGNOSIS — Q788 Other specified osteochondrodysplasias: Secondary | ICD-10-CM | POA: Diagnosis not present

## 2024-04-19 DIAGNOSIS — M9901 Segmental and somatic dysfunction of cervical region: Secondary | ICD-10-CM | POA: Diagnosis not present

## 2024-04-19 DIAGNOSIS — Z01419 Encounter for gynecological examination (general) (routine) without abnormal findings: Secondary | ICD-10-CM

## 2024-04-19 DIAGNOSIS — M9902 Segmental and somatic dysfunction of thoracic region: Secondary | ICD-10-CM

## 2024-04-19 DIAGNOSIS — Z1231 Encounter for screening mammogram for malignant neoplasm of breast: Secondary | ICD-10-CM

## 2024-04-19 DIAGNOSIS — G2589 Other specified extrapyramidal and movement disorders: Secondary | ICD-10-CM | POA: Diagnosis not present

## 2024-04-19 DIAGNOSIS — Z3041 Encounter for surveillance of contraceptive pills: Secondary | ICD-10-CM

## 2024-04-19 DIAGNOSIS — M9904 Segmental and somatic dysfunction of sacral region: Secondary | ICD-10-CM | POA: Diagnosis not present

## 2024-04-19 DIAGNOSIS — M25531 Pain in right wrist: Secondary | ICD-10-CM

## 2024-04-19 DIAGNOSIS — N951 Menopausal and female climacteric states: Secondary | ICD-10-CM

## 2024-04-19 DIAGNOSIS — M9903 Segmental and somatic dysfunction of lumbar region: Secondary | ICD-10-CM | POA: Diagnosis not present

## 2024-04-19 MED ORDER — NORETHINDRONE ACET-ETHINYL EST 1-20 MG-MCG PO TABS: 1.0000 | ORAL_TABLET | Freq: Every day | ORAL | 0 refills | Status: DC

## 2024-04-19 NOTE — Assessment & Plan Note (Signed)
 Will continue to monitor.  Did get ultrasound today.  We did look at a ultrasound some hypoechoic changes.  Difficult to see the scaphoid lunate ligament.

## 2024-04-19 NOTE — Progress Notes (Signed)
 Gynecology Annual Exam  PCP: Clarise Crooks, MD  Chief Complaint:  Chief Complaint  Patient presents with   Gynecologic Exam    History of Present Illness: Patient is a 53 y.o. G0P0000 presents for annual exam. The patient has no complaints today. She is curious about her menopausal status. She has a friend that stopped her birth control but then had her periods return. Rusti has not had a bleed for some months. She has night sweats, hot flashes, vaginal dryness and some changes to her mood. The night sweats are not as bad but the daytime hot flashes are annoying.   LMP: No LMP recorded. Her cycles have gradually become longer and the she has light bleeding when they occur, it has been months since she last had any bleeding,    The patient is not sexually active. She currently uses OCP (estrogen/progesterone) for contraception. She denies dyspareunia.  The patient does  occasionally perform self breast exams.  There is no notable family history of breast or ovarian cancer in her family.  The patient wears seatbelts: yes.   The patient has regular exercise: yes.  Walks her dog   The patient reports current symptoms of depression.    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    TMJ arthralgia 03/01/2024    Scapular dyskinesis 05/28/2022    Somatic dysfunction of spine, cervical 05/28/2022    Lumbar radiculopathy 10/17/2020    Patellofemoral arthritis of right knee 10/01/2020     Seen on ultrasound 2021 injection given October 01, 2020 viscosupplementation given December 04, 2020    Interstitial lung disease (HCC) 10/14/2015    Congenital hypothyroidism without goiter 10/14/2015    Abnormal chest CT 10/14/2015    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 Contraception: OCP (estrogen/progesterone) Last Pap: Results were: 2024 no abnormalities  Last mammogram: 2023 Results were: BI-RAD I  Obstetric History: G0P0000  Family History:  Family History  Problem Relation Age of Onset   Parkinson's disease Mother    Hypertension Father    Stroke Maternal Grandmother    Diabetes Paternal Grandfather    Heart disease Paternal Grandfather    Liver cancer Neg Hx    Colon cancer Neg Hx    Esophageal cancer Neg Hx    Rectal cancer Neg Hx    Stomach cancer Neg Hx     Social History:  Social History   Socioeconomic History   Marital status: Married    Spouse name: Not on file   Number of children: 2   Years of education: Not on file   Highest education level: Master's degree (e.g., MA, MS, MEng, MEd, MSW, MBA)  Occupational History   Occupation: homemaker  Tobacco Use   Smoking status: Never   Smokeless tobacco: Never  Vaping Use   Vaping status: Never Used  Substance and Sexual Activity   Alcohol use: Not Currently    Alcohol/week: 2.0 standard drinks of alcohol    Types: 2 Glasses of wine per week    Comment: rarely   Drug use: No   Sexual activity:  Not Currently    Birth control/protection: Pill  Other Topics Concern   Not on file  Social History Narrative   Not on file   Social Drivers of Health   Financial Resource Strain: Low Risk  (12/09/2023)   Received from [facility]   Overall Financial Resource Strain (CARDIA)    Difficulty of Paying Living Expenses: Not hard at all  Food Insecurity: No Food Insecurity (12/09/2023)   Received from [facility]   Hunger Vital Sign    Worried About Running Out of Food in the Last Year: Never true    Ran Out of Food in the Last Year: Never true  Transportation Needs: No Transportation Needs  (12/09/2023)   Received from [facility] - Transportation    In the past 12 months, has lack of transportation kept you from medical appointments or from getting medications?: No    Lack of Transportation (Non-Medical): No  Physical Activity: Insufficiently Active (05/14/2023)   Exercise Vital Sign    Days of Exercise per Week: 1 day    Minutes of Exercise per Session: 20 min  Stress: Stress Concern Present (05/14/2023)   [employer] of Occupational Health - Occupational Stress Questionnaire    Feeling of Stress : Rather much  Social Connections: Moderately Isolated (05/14/2023)   Social Connection and Isolation Panel [NHANES]    Frequency of Communication with Friends and Family: Twice a week    Frequency of Social Gatherings with Friends and Family: Once a week    Attends Religious Services: Never    Database administrator or Organizations: No    Attends Engineer, structural: Not on file    Marital Status: Married  Catering manager Violence: Not on file    Allergies:  Allergies  Allergen Reactions   Gluten Meal     Other Reaction(s): Other (See Comments)  Headache GI upset    Medications: Prior to Admission medications   Medication Sig Start Date End Date Taking? Authorizing Provider  atorvastatin  (LIPITOR) 20 MG tablet Take 1 tablet (20 mg total) by mouth daily. 05/17/23   Clarise Crooks, MD  carbamazepine (CARBATROL) 200 MG 12 hr capsule Take 200 mg by mouth 2 (two) times daily. 08/07/22   [provider]  carbamide peroxide (DEBROX) 6.5 % OTIC solution Place 5 drops into both ears 2 (two) times daily. 11/03/21   Clarise Crooks, MD  cetirizine -pseudoephedrine  (ZYRTEC -D) 5-120 MG tablet Take 1 tablet by mouth 2 (two) times daily as needed for allergies. 08/12/16   Sharren Decree, MD  famotidine  (PEPCID ) 20 MG tablet TAKE ONE TABLET BY MOUTH ONE TIME DAILY 09/20/23   Diamond Formica, MD  fluticasone  (FLONASE ) 50 MCG/ACT nasal spray  Place 2 sprays into both nostrils daily. 08/12/16   Sharren Decree, MD  gabapentin  (NEURONTIN ) 300 MG capsule Take 1 capsule (300 mg total) by mouth 3 (three) times daily. 12/02/23   Hyatt, Max T, DPM  lamoTRIgine (LAMICTAL) 150 MG tablet Take 300 mg by mouth at bedtime. 10/08/22   [provider]  levothyroxine  (SYNTHROID ) 100 MCG tablet Take 100 mcg by mouth daily before breakfast.    [provider]  mometasone (ELOCON) 0.1 % ointment Apply topically. 01/18/24   [provider]  norethindrone -ethinyl estradiol (LOESTRIN 1/20, 21,) 1-20 MG-MCG tablet Take 1 tablet by mouth daily. 04/19/24   Kestrel Mis, Alva Jewels, CNM  pantoprazole  (PROTONIX ) 40 MG tablet TAKE ONE TABLET BY MOUTH ONE  TIME DAILY 02/15/23   Diamond Formica, MD    Physical Exam Vitals: Blood pressure (!) 163/91, pulse 80, height 5\' 5"  (1.651 m), weight 147 lb 1.6 oz (66.7 kg).  General: NAD HEENT: normocephalic, anicteric Thyroid : no enlargement, no palpable nodules Pulmonary: No increased work of breathing, CTAB Cardiovascular: RRR, distal pulses 2+ Breast: Breast symmetrical, no tenderness, no palpable nodules or masses, no skin or nipple retraction present, no nipple discharge.  No axillary or supraclavicular lymphadenopathy. Abdomen: NABS, soft, non-tender, non-distended.  Umbilicus without lesions.  No hepatomegaly, splenomegaly or masses palpable. No evidence of hernia  Genitourinary:  External: Normal external female genitalia.  Normal urethral meatus, normal Bartholin's and Skene's glands.    Vagina: exam deferred   Cervix:   Uterus:   Adnexa:   Rectal: deferred  Lymphatic: no evidence of inguinal lymphadenopathy Extremities: no edema, erythema, or tenderness Neurologic: Grossly intact Psychiatric: mood appropriate, affect full   Assessment: 53 y.o. G0P0000 routine annual exam  Plan: Problem List Items Addressed This Visit   None Visit Diagnoses       Well woman exam    -  Primary    Relevant Medications   norethindrone -ethinyl estradiol (LOESTRIN 1/20, 21,) 1-20 MG-MCG tablet   Other Relevant Orders   MM DIGITAL SCREENING BILATERAL     Encounter for surveillance of contraceptive pills       Relevant Medications   norethindrone -ethinyl estradiol (LOESTRIN 1/20, 21,) 1-20 MG-MCG tablet     Screening mammogram for breast cancer       Relevant Orders   MM DIGITAL SCREENING BILATERAL       1) Mammogram - recommend yearly screening mammogram.  Mammogram Was ordered today   2) STI screening  wasoffered and declined  3) ASCCP guidelines and rational discussed.  Patient opts for every 5 years screening interval  4) Contraception - the patient is currently using  OCP (estrogen/progesterone).  She is happy with her current form of contraception and plans to continue. Discussed potentially stopping for 2-3 months and then doing labs to confirm post menopause. Pt prefers to remain on OCP's. Also discussed potentially switching to HRT to better manage symptoms.   5) Colonoscopy had Cologard 2 years ago  -- Screening recommended starting at age 32 for average risk individuals, age 72 for individuals deemed at increased risk (including African Americans) and recommended to continue until age 53.  For patient age 49-85 individualized approach is recommended.  Gold standard screening is via colonoscopy, Cologuard screening is an acceptable alternative for patient unwilling or unable to undergo colonoscopy.  "Colorectal cancer screening for average?risk adults: 2018 guideline update from the American Cancer Society"CA: A Cancer Journal for Clinicians: May 26, 2017   6) Routine healthcare maintenance including cholesterol, diabetes screening discussed managed by PCP  7) Return in about 1 year (around 04/19/2025).  Anice Kerbs, CNM   [facility] 04/19/2024, 4:27 PM

## 2024-04-19 NOTE — Assessment & Plan Note (Signed)
 Scapular dyskinesis noted.  Discussed icing regimen of home exercises, which activities to do and which ones to avoid.  Increase activity slowly.  Discussed icing regimen.  Follow-up again in 6 to 8 weeks

## 2024-04-19 NOTE — Patient Instructions (Signed)
 Great to see you Stephanie Mcguire for wrist See me again in 2 months

## 2024-04-25 ENCOUNTER — Other Ambulatory Visit: Payer: Self-pay | Admitting: Licensed Practical Nurse

## 2024-04-25 DIAGNOSIS — Z3041 Encounter for surveillance of contraceptive pills: Secondary | ICD-10-CM

## 2024-04-25 MED ORDER — LEVONORGESTREL-ETHINYL ESTRAD 0.1-20 MG-MCG PO TABS: 1.0000 | ORAL_TABLET | Freq: Every day | ORAL | 4 refills | Status: AC

## 2024-04-25 NOTE — Progress Notes (Signed)
 Pt seen for annual exam, reported perimenopausal symptoms. Prefers to switch OCP's than start HRT, new script to increase estrogen sent to pharmacy [facility]. Anice Kerbs, CNM   [facility]  04/25/24  8:32 AM

## 2024-06-06 DIAGNOSIS — E782 Mixed hyperlipidemia: Secondary | ICD-10-CM | POA: Diagnosis not present

## 2024-06-06 DIAGNOSIS — L538 Other specified erythematous conditions: Secondary | ICD-10-CM | POA: Diagnosis not present

## 2024-06-06 DIAGNOSIS — Z131 Encounter for screening for diabetes mellitus: Secondary | ICD-10-CM | POA: Diagnosis not present

## 2024-06-06 DIAGNOSIS — L820 Inflamed seborrheic keratosis: Secondary | ICD-10-CM | POA: Diagnosis not present

## 2024-06-06 DIAGNOSIS — E559 Vitamin D deficiency, unspecified: Secondary | ICD-10-CM | POA: Diagnosis not present

## 2024-06-06 DIAGNOSIS — Z130 Encounter for screening for diseases of the blood and blood-forming organs and certain disorders involving the immune mechanism: Secondary | ICD-10-CM | POA: Diagnosis not present

## 2024-06-06 DIAGNOSIS — E538 Deficiency of other specified B group vitamins: Secondary | ICD-10-CM | POA: Diagnosis not present

## 2024-06-12 DIAGNOSIS — F3174 Bipolar disorder, in full remission, most recent episode manic: Secondary | ICD-10-CM | POA: Diagnosis not present

## 2024-06-12 DIAGNOSIS — F410 Panic disorder [episodic paroxysmal anxiety] without agoraphobia: Secondary | ICD-10-CM | POA: Diagnosis not present

## 2024-06-12 DIAGNOSIS — F3176 Bipolar disorder, in full remission, most recent episode depressed: Secondary | ICD-10-CM | POA: Diagnosis not present

## 2024-06-13 ENCOUNTER — Ambulatory Visit: Admitting: Family Medicine

## 2024-06-14 DIAGNOSIS — Z1231 Encounter for screening mammogram for malignant neoplasm of breast: Secondary | ICD-10-CM | POA: Diagnosis not present

## 2024-06-15 DIAGNOSIS — J849 Interstitial pulmonary disease, unspecified: Secondary | ICD-10-CM | POA: Diagnosis not present

## 2024-06-15 DIAGNOSIS — Z0000 Encounter for general adult medical examination without abnormal findings: Secondary | ICD-10-CM | POA: Diagnosis not present

## 2024-06-15 DIAGNOSIS — F3181 Bipolar II disorder: Secondary | ICD-10-CM | POA: Diagnosis not present

## 2024-06-15 DIAGNOSIS — N946 Dysmenorrhea, unspecified: Secondary | ICD-10-CM | POA: Diagnosis not present

## 2024-07-17 DIAGNOSIS — E039 Hypothyroidism, unspecified: Secondary | ICD-10-CM | POA: Diagnosis not present

## 2024-07-18 NOTE — Progress Notes (Unsigned)
 [facility] [address] [place] [address] Phone: [phone] Subjective:   ISusannah Gully, am serving as a scribe for Dr. Arthea Claudene.  I'm seeing this patient by the request  of:  [name], MD (Inactive)  CC: back and neck pain follow up   YEP:Dlagzrupcz  LEDA BELLEFEUILLE is a 53 y.o. female coming in with complaint of back and neck pain. OMT 04/19/2024. Also f/u for R wrist pain. Patient states still having some tenderness in palm of hand. That started about 6 weeks ago. Occasional pain with prep work.  Medications patient has been prescribed:   Taking:         Reviewed prior external information including notes and imaging from previsou exam, outside providers and external EMR if available.   As well as notes that were available from care everywhere and other healthcare systems.  Past medical history, social, surgical and family history all reviewed in electronic medical record.  No pertanent information unless stated regarding to the chief complaint.   Past Medical History:  Diagnosis Date   Allergy    Anxiety    Aphthous ulcer    Arthritis    spondylosis   Asthma    Bipolar 2 disorder (HCC)    Bipolar disease, chronic (HCC)    Chest pain    Depression    DOE (dyspnea on exertion)    Elevated cholesterol 2000   GERD (gastroesophageal reflux disease)    Hyperlipidemia    Thyroid  disease     Allergies  Allergen Reactions   Gluten Meal     Other Reaction(s): Other (See Comments)  Headache GI upset     Review of Systems:  No headache, visual changes, nausea, vomiting, diarrhea, constipation, dizziness, abdominal pain, skin rash, fevers, chills, night sweats, weight loss, swollen lymph nodes, body aches, joint swelling, chest pain, shortness of breath, mood changes. POSITIVE muscle aches  Objective  Blood pressure 112/74, pulse 92, height 5' 5 (1.651 m), weight 147 lb (66.7 kg), SpO2 98%.   General: No  apparent distress alert and oriented x3 mood and affect normal, dressed appropriately.  HEENT: Pupils equal, extraocular movements intact  Respiratory: Patient's speak in full sentences and does not appear short of breath  Cardiovascular: No lower extremity edema, non tender, no erythema  Gait MSK:  Back does have some loss lordosis.  Some tenderness to palpation of the paraspinal musculature.  Tightness noted with Deri right greater than left.  Right wrist seems to have some limited range of motion.  Some pain over the ECU area.  Osteopathic findings  C2 flexed rotated and side bent right C5 flexed rotated and side bent left T3 extended rotated and side bent right inhaled rib T9 extended rotated and side bent left L2 flexed rotated and side bent right Sacrum right on right     Assessment and Plan:  Lunate-triquetrum synostosis Has had difficulty with this previously.  Will get x-rays of the left wrist to further evaluate.  Believe that patient should do well with conservative therapy hopefully.  Pain is more over the ulnar aspect and could be more of the pisiform.  Could be more of a tendinitis we discussed which Coban and decreasing tension in the area with repetitive activity.  Follow-up again in 6 to 8 weeks  Scapular dyskinesis Scapular dyskinesis noted.  Discussed icing regimen and home exercises, discussed posture and ergonomics.  Patient is going to have a little more time where  she will be able to further take care of herself.  Follow-up again in 6 to 8 weeks otherwise    Nonallopathic problems  Decision today to treat with OMT was based on Physical Exam  After verbal consent patient was treated with HVLA, ME, FPR techniques in cervical, rib, thoracic, lumbar, and sacral  areas  Patient tolerated the procedure well with improvement in symptoms  Patient given exercises, stretches and lifestyle modifications  See medications in patient instructions if given  Patient  will follow up in 4-8 weeks      The above documentation has been reviewed and is accurate and complete Arthea CHRISTELLA Sharps, DO        Note: This dictation was prepared with Dragon dictation along with smaller phrase technology. Any transcriptional errors that result from this process are unintentional.

## 2024-07-19 ENCOUNTER — Ambulatory Visit (INDEPENDENT_AMBULATORY_CARE_PROVIDER_SITE_OTHER): Payer: Self-pay

## 2024-07-19 ENCOUNTER — Encounter: Payer: Self-pay | Admitting: Family Medicine

## 2024-07-19 ENCOUNTER — Ambulatory Visit: Admitting: Family Medicine

## 2024-07-19 VITALS — BP 112/74 | HR 92 | Ht 65.0 in | Wt 147.0 lb

## 2024-07-19 DIAGNOSIS — M9904 Segmental and somatic dysfunction of sacral region: Secondary | ICD-10-CM | POA: Diagnosis not present

## 2024-07-19 DIAGNOSIS — M9902 Segmental and somatic dysfunction of thoracic region: Secondary | ICD-10-CM | POA: Diagnosis not present

## 2024-07-19 DIAGNOSIS — M25531 Pain in right wrist: Secondary | ICD-10-CM | POA: Diagnosis not present

## 2024-07-19 DIAGNOSIS — G2589 Other specified extrapyramidal and movement disorders: Secondary | ICD-10-CM

## 2024-07-19 DIAGNOSIS — M9903 Segmental and somatic dysfunction of lumbar region: Secondary | ICD-10-CM | POA: Diagnosis not present

## 2024-07-19 DIAGNOSIS — Q788 Other specified osteochondrodysplasias: Secondary | ICD-10-CM

## 2024-07-19 DIAGNOSIS — M9901 Segmental and somatic dysfunction of cervical region: Secondary | ICD-10-CM | POA: Diagnosis not present

## 2024-07-19 DIAGNOSIS — M9908 Segmental and somatic dysfunction of rib cage: Secondary | ICD-10-CM

## 2024-07-19 NOTE — Assessment & Plan Note (Signed)
 Has had difficulty with this previously.  Will get x-rays of the left wrist to further evaluate.  Believe that patient should do well with conservative therapy hopefully.  Pain is more over the ulnar aspect and could be more of the pisiform.  Could be more of a tendinitis we discussed which Coban and decreasing tension in the area with repetitive activity.  Follow-up again in 6 to 8 weeks

## 2024-07-19 NOTE — Assessment & Plan Note (Signed)
 Scapular dyskinesis noted.  Discussed icing regimen and home exercises, discussed posture and ergonomics.  Patient is going to have a little more time where she will be able to further take care of herself.  Follow-up again in 6 to 8 weeks otherwise

## 2024-07-19 NOTE — Patient Instructions (Signed)
 Good to see you! Coband for wrist Xray today See you again in 2 months

## 2024-07-20 ENCOUNTER — Encounter: Payer: Self-pay | Admitting: Family Medicine

## 2024-07-24 DIAGNOSIS — E039 Hypothyroidism, unspecified: Secondary | ICD-10-CM | POA: Diagnosis not present

## 2024-09-01 ENCOUNTER — Encounter: Payer: Self-pay | Admitting: Family Medicine

## 2024-09-04 ENCOUNTER — Ambulatory Visit: Admitting: Family Medicine

## 2024-09-04 ENCOUNTER — Other Ambulatory Visit: Payer: Self-pay

## 2024-09-04 ENCOUNTER — Encounter: Payer: Self-pay | Admitting: Family Medicine

## 2024-09-04 ENCOUNTER — Telehealth: Payer: Self-pay | Admitting: Family Medicine

## 2024-09-04 VITALS — HR 95 | Ht 65.0 in

## 2024-09-04 DIAGNOSIS — M25531 Pain in right wrist: Secondary | ICD-10-CM | POA: Diagnosis not present

## 2024-09-04 DIAGNOSIS — G5601 Carpal tunnel syndrome, right upper limb: Secondary | ICD-10-CM | POA: Diagnosis not present

## 2024-09-04 NOTE — Progress Notes (Signed)
 [facility] [address] [place] [address] Phone: [phone] Subjective:   ISusannah Mcguire, am serving as a scribe for Dr. Arthea Claudene.  I'm seeing this patient by the request  of:  Stephanie Cathryne BROCKS, MD (Inactive)  CC: Wrist pain  YEP:Dlagzrupcz  Stephanie Mcguire is a 53 y.o. female coming in with complaint of wrist pain.  Right sided.  Was seen previously for this.  Had x-rays taken that were unremarkable.  Did have some hypoechoic changes in the scaphoid lunate joint but otherwise fairly unremarkable on ultrasound as well.  Patient states that into wearing the brace has become much more difficult and more painful.  Worsened over the weekend.  Patient states symptoms are similar to the first time. Pain has intensified and a little different. Pain now radiating up to elbow and pain to the ring finger.    Past Medical History:  Diagnosis Date   Allergy    Anxiety    Aphthous ulcer    Arthritis    spondylosis   Asthma    Bipolar 2 disorder (HCC)    Bipolar disease, chronic (HCC)    Chest pain    Depression    DOE (dyspnea on exertion)    Elevated cholesterol 2000   GERD (gastroesophageal reflux disease)    Hyperlipidemia    Thyroid  disease    Past Surgical History:  Procedure Laterality Date   BUNIONECTOMY     MANDIBLE SURGERY  12/29/2007   RECONSTRUCTION OF EYELID  1989 and 1990   UPPER GASTROINTESTINAL ENDOSCOPY     Social History   Socioeconomic History   Marital status: Married    Spouse name: Not on file   Number of children: 2   Years of education: Not on file   Highest education level: Master's degree (e.g., MA, MS, MEng, MEd, MSW, MBA)  Occupational History   Occupation: homemaker  Tobacco Use   Smoking status: Never   Smokeless tobacco: Never  Vaping Use   Vaping status: Never Used  Substance and Sexual Activity   Alcohol use: Not Currently    Alcohol/week: 2.0 standard drinks of alcohol    Types: 2 Glasses of  wine per week    Comment: rarely   Drug use: No   Sexual activity: Not Currently    Birth control/protection: Pill  Other Topics Concern   Not on file  Social History Narrative   Not on file   Social Drivers of Health   Financial Resource Strain: Low Risk  (06/15/2024)   Received from [facility]   Overall Financial Resource Strain (CARDIA)    Difficulty of Paying Living Expenses: Not hard at all  Food Insecurity: No Food Insecurity (06/15/2024)   Received [facility]   Hunger Vital Sign    Within the past 12 months, you worried that your food would run out before you got the money to buy more.: Never true    Within the past 12 months, the food you bought just didn't last and you didn't have money to get more.: Never true  Transportation Needs: No Transportation Needs (06/15/2024)   Received from [facility]    In the past 12 months, has lack of transportation kept you from medical appointments or from getting medications?: No    Lack of Transportation (Non-Medical): No  Physical Activity: Insufficiently Active (05/14/2023)   Exercise Vital Sign    Days  of Exercise per Week: 1 day    Minutes of Exercise per Session: 20 min  Stress: Stress Concern Present (05/14/2023)   [employer] of Occupational Health - Occupational Stress Questionnaire    Feeling of Stress : Rather much  Social Connections: Moderately Isolated (05/14/2023)   Social Connection and Isolation Panel    Frequency of Communication with Friends and Family: Twice a week    Frequency of Social Gatherings with Friends and Family: Once a week    Attends Religious Services: Never    Database administrator or Organizations: No    Attends Engineer, structural: Not on file    Marital Status: Married   Allergies  Allergen Reactions   Gluten Meal     Other Reaction(s): Other (See Comments)  Headache GI upset   Family  History  Problem Relation Age of Onset   Parkinson's disease Mother    Hypertension Father    Stroke Maternal Grandmother    Diabetes Paternal Grandfather    Heart disease Paternal Grandfather    Liver cancer Neg Hx    Colon cancer Neg Hx    Esophageal cancer Neg Hx    Rectal cancer Neg Hx    Stomach cancer Neg Hx     Current Outpatient Medications (Endocrine & Metabolic):    levonorgestrel -ethinyl estradiol (AVIANE) 0.1-20 MG-MCG tablet, Take 1 tablet by mouth daily.   levothyroxine  (SYNTHROID ) 100 MCG tablet, Take 100 mcg by mouth daily before breakfast.  Current Outpatient Medications (Cardiovascular):    atorvastatin  (LIPITOR) 20 MG tablet, Take 1 tablet (20 mg total) by mouth daily.  Current Outpatient Medications (Respiratory):    cetirizine -pseudoephedrine  (ZYRTEC -D) 5-120 MG tablet, Take 1 tablet by mouth 2 (two) times daily as needed for allergies.   fluticasone  (FLONASE ) 50 MCG/ACT nasal spray, Place 2 sprays into both nostrils daily.    Current Outpatient Medications (Other):    carbamazepine (CARBATROL) 200 MG 12 hr capsule, Take 200 mg by mouth 2 (two) times daily.   carbamide peroxide (DEBROX) 6.5 % OTIC solution, Place 5 drops into both ears 2 (two) times daily.   famotidine  (PEPCID ) 20 MG tablet, TAKE ONE TABLET BY MOUTH ONE TIME DAILY   gabapentin  (NEURONTIN ) 300 MG capsule, Take 1 capsule (300 mg total) by mouth 3 (three) times daily.   lamoTRIgine (LAMICTAL) 150 MG tablet, Take 300 mg by mouth at bedtime.   mometasone (ELOCON) 0.1 % ointment, Apply topically.   pantoprazole  (PROTONIX ) 40 MG tablet, TAKE ONE TABLET BY MOUTH ONE TIME DAILY   Reviewed prior external information including notes and imaging from  primary care provider As well as notes that were available from care everywhere and other healthcare systems.  Past medical history, social, surgical and family history all reviewed in electronic medical record.  No pertanent information unless stated  regarding to the chief complaint.   Review of Systems:  No headache, visual changes, nausea, vomiting, diarrhea, constipation, dizziness, abdominal pain, skin rash, fevers, chills, night sweats, weight loss, swollen lymph nodes, body aches, joint swelling, chest pain, shortness of breath, mood changes. POSITIVE muscle aches  Objective  Pulse 95, height 5' 5 (1.651 m), SpO2 98%.   General: No apparent distress alert and oriented x3 mood and affect normal, dressed appropriately.  HEENT: Pupils equal, extraocular movements intact  Respiratory: Patient's speak in full sentences and does not appear short of breath  Cardiovascular: No lower extremity edema, non tender, no erythema  Right wrist Tenderness to palpation diffusely.  Mild  positive Tinel's and mild positive Phalen's but very minimally more than the contralateral side.  Some pain with resisted extension of the wrist noted.  Some pain over the lunate area.   Procedure: Real-time Ultrasound Guided Injection of right carpal tunnel Device: GE Logiq Q7  Ultrasound guided injection is preferred based studies that show increased duration, increased effect, greater accuracy, decreased procedural pain, increased response rate with ultrasound guided versus blind injection.  Verbal informed consent obtained.  Time-out conducted.  Noted no overlying erythema, induration, or other signs of local infection.  Skin prepped in a sterile fashion.  Local anesthesia: Topical Ethyl chloride.  With sterile technique and under real time ultrasound guidance:  median nerve visualized.  23g 5/8 inch needle inserted distal to proximal approach into nerve sheath. Pictures taken nfor needle placement. Patient did have injection of 0.5 cc of 0.5% Marcaine, and 0.5 cc of Kenalog  40 mg/dL. Completed without difficulty  Pain immediately resolved suggesting accurate placement of the medication.  Advised to call if fevers/chills, erythema, induration, drainage, or  persistent bleeding.  Images saved Impression: Technically successful ultrasound guided injection.   Impression and Recommendations:    The above documentation has been reviewed and is accurate and complete Ferris Tally M Nadalie Laughner, DO

## 2024-09-04 NOTE — Telephone Encounter (Signed)
Patient has been scheduled for this afternoon.  °

## 2024-09-04 NOTE — Patient Instructions (Signed)
 Keep your other appointment Send message on Monday to let me know how you doing Take out the metal in the brace

## 2024-09-04 NOTE — Assessment & Plan Note (Signed)
 Patient given injection patient responded relatively well.  Discussed icing regimen and home exercises, discussed which activities to do in which ones to avoid.  Please.  Follow-up with me again in 6 to 8 weeks discussed bracing.  If no significant benefit will need to consider other possible treatment options.  May need a nerve conduction or MRI of the wrist with patient not quite improving with conservative therapy.

## 2024-09-04 NOTE — Telephone Encounter (Signed)
 Patient called in regards to mychart message sent this morning regarding that she has a bone out. She is asking if there is any way that we can see her sooner than her appt on the 23rd. Her appt has been added to the wait list. Please advise.

## 2024-09-05 ENCOUNTER — Encounter: Payer: Self-pay | Admitting: Family Medicine

## 2024-09-09 ENCOUNTER — Encounter: Payer: Self-pay | Admitting: Family Medicine

## 2024-09-19 ENCOUNTER — Ambulatory Visit: Payer: Self-pay | Admitting: Family Medicine

## 2024-09-19 ENCOUNTER — Ambulatory Visit: Admitting: Family Medicine

## 2024-10-03 NOTE — Progress Notes (Unsigned)
 [facility] [address] [place] [address] Phone: [phone] Subjective:   Stephanie Mcguire, am serving as a scribe for Dr. Arthea Stephanie.  I'm seeing this patient by the request  of:  Stephanie Cathryne BROCKS, MD (Inactive)  CC: Back and neck pain follow-up and right wrist pain   YEP:Dlagzrupcz  Stephanie Mcguire is a 53 y.o. female coming in with complaint of back and neck pain. OMT on 07/19/2024. Recently seen for R carpal tunnel. Patient states shooting pain has stopped but wrist is still bothering her quite a bit. Is using a brace today which seems to be helpful.   Medications patient has been prescribed:   Taking:    Right wrist pain      Reviewed prior external information including notes and imaging from previsou exam, outside providers and external EMR if available.   As well as notes that were available from care everywhere and other healthcare systems.  Past medical history, social, surgical and family history all reviewed in electronic medical record.  No pertanent information unless stated regarding to the chief complaint.   Past Medical History:  Diagnosis Date   Allergy    Anxiety    Aphthous ulcer    Arthritis    spondylosis   Asthma    Bipolar 2 disorder (HCC)    Bipolar disease, chronic (HCC)    Chest pain    Depression    DOE (dyspnea on exertion)    Elevated cholesterol 2000   GERD (gastroesophageal reflux disease)    Hyperlipidemia    Thyroid  disease     Allergies  Allergen Reactions   Gluten Meal     Other Reaction(s): Other (See Comments)  Headache GI upset     Review of Systems:  No headache, visual changes, nausea, vomiting, diarrhea, constipation, dizziness, abdominal pain, skin rash, fevers, chills, night sweats, weight loss, swollen lymph nodes, body aches, joint swelling, chest pain, shortness of breath, mood changes. POSITIVE muscle aches  Objective  Blood pressure 120/86, pulse 77, height 5' 5  (1.651 m), weight 146 lb (66.2 kg), SpO2 99%.   General: No apparent distress alert and oriented x3 mood and affect normal, dressed appropriately.  HEENT: Pupils equal, extraocular movements intact  Respiratory: Patient's speak in full sentences and does not appear short of breath  Cardiovascular: No lower extremity edema, non tender, no erythema   Right wrist exam does have relatively good range of motion noted.  Patient does have some tenderness to palpation diffusely on the wrist.  No specific swelling noted.  Positive pain no over the TFCC more than over the carpal tunnel area.  Limited muscular skeletal ultrasound was performed and interpreted by Stephanie Mcguire, M  Limited ultrasound shows no sign of any infectious etiology, decrease in the diameter of the median nerve from previous exam.  Patient still has hypoechoic changes around the TFCC. Impression: Difficulty with the lunate triquetrium as well as potentially TFCC    Assessment and Plan:  Lunate-triquetrum synostosis Concerned with some of the findings of the lunate triquetrium from stenosis.  Concerned that this could be potentially contributing to more the discomfort and pain.  Given a carpal tunnel injection that did help with the radicular symptoms but unfortunately did not help with the severe sharp pain that goes up into the forearm.  Do feel at this point further evaluation with an MR arthrogram would be beneficial.  Depending on findings we will discuss further evaluation and treatment.  The above documentation has been reviewed and is accurate and complete Stephanie Mcguire M Stephanie Swaney, DO         Note: This dictation was prepared with Dragon dictation along with smaller phrase technology. Any transcriptional errors that result from this process are unintentional.

## 2024-10-04 ENCOUNTER — Other Ambulatory Visit: Payer: Self-pay

## 2024-10-04 ENCOUNTER — Encounter: Payer: Self-pay | Admitting: Family Medicine

## 2024-10-04 ENCOUNTER — Ambulatory Visit: Admitting: Family Medicine

## 2024-10-04 VITALS — BP 120/86 | HR 77 | Ht 65.0 in | Wt 146.0 lb

## 2024-10-04 DIAGNOSIS — Q788 Other specified osteochondrodysplasias: Secondary | ICD-10-CM | POA: Diagnosis not present

## 2024-10-04 DIAGNOSIS — M25531 Pain in right wrist: Secondary | ICD-10-CM

## 2024-10-04 NOTE — Assessment & Plan Note (Signed)
 Concerned with some of the findings of the lunate triquetrium from stenosis.  Concerned that this could be potentially contributing to more the discomfort and pain.  Given a carpal tunnel injection that did help with the radicular symptoms but unfortunately did not help with the severe sharp pain that goes up into the forearm.  Do feel at this point further evaluation with an MR arthrogram would be beneficial.  Depending on findings we will discuss further evaluation and treatment.

## 2024-10-04 NOTE — Patient Instructions (Signed)
 [facility] [phone] See you again in 6 weeks for MSK

## 2024-10-09 ENCOUNTER — Encounter: Payer: Self-pay | Admitting: Family Medicine

## 2024-10-09 ENCOUNTER — Other Ambulatory Visit: Payer: Self-pay | Admitting: Family Medicine

## 2024-10-09 DIAGNOSIS — M25531 Pain in right wrist: Secondary | ICD-10-CM

## 2024-10-13 ENCOUNTER — Ambulatory Visit
Admission: RE | Admit: 2024-10-13 | Discharge: 2024-10-13 | Disposition: A | Source: Ambulatory Visit | Attending: Family Medicine | Admitting: Family Medicine

## 2024-10-13 ENCOUNTER — Ambulatory Visit
Admission: RE | Admit: 2024-10-13 | Discharge: 2024-10-13 | Disposition: A | Discharge status: Home / Self Care | Source: Ambulatory Visit | Attending: Family Medicine | Admitting: Family Medicine

## 2024-10-13 DIAGNOSIS — M25531 Pain in right wrist: Secondary | ICD-10-CM

## 2024-10-13 MED ORDER — IOPAMIDOL (ISOVUE-M 200) INJECTION 41%
1.5000 mL | Freq: Once | INTRAMUSCULAR | Status: AC
  Administered 2024-10-13: 1.5 mL via INTRA_ARTICULAR

## 2024-10-17 ENCOUNTER — Ambulatory Visit: Payer: Self-pay | Admitting: Family Medicine

## 2024-10-18 ENCOUNTER — Other Ambulatory Visit: Payer: Self-pay

## 2024-10-18 DIAGNOSIS — M24131 Other articular cartilage disorders, right wrist: Secondary | ICD-10-CM

## 2024-10-18 DIAGNOSIS — G5601 Carpal tunnel syndrome, right upper limb: Secondary | ICD-10-CM

## 2024-11-06 DIAGNOSIS — D2272 Melanocytic nevi of left lower limb, including hip: Secondary | ICD-10-CM | POA: Diagnosis not present

## 2024-11-06 DIAGNOSIS — D225 Melanocytic nevi of trunk: Secondary | ICD-10-CM | POA: Diagnosis not present

## 2024-11-06 DIAGNOSIS — D2262 Melanocytic nevi of left upper limb, including shoulder: Secondary | ICD-10-CM | POA: Diagnosis not present

## 2024-11-06 DIAGNOSIS — D2261 Melanocytic nevi of right upper limb, including shoulder: Secondary | ICD-10-CM | POA: Diagnosis not present

## 2024-11-08 DIAGNOSIS — S6981XA Other specified injuries of right wrist, hand and finger(s), initial encounter: Secondary | ICD-10-CM | POA: Diagnosis not present

## 2024-11-13 DIAGNOSIS — G252 Other specified forms of tremor: Secondary | ICD-10-CM | POA: Diagnosis not present

## 2024-11-20 DIAGNOSIS — L300 Nummular dermatitis: Secondary | ICD-10-CM | POA: Diagnosis not present

## 2024-11-28 NOTE — Progress Notes (Unsigned)
 [facility] [address] [place] [address] Phone: [phone] Subjective:   ISusannah Mcguire, am serving as a scribe for Dr. Arthea Claudene.  I'm seeing this patient by the request  of:  No primary care provider on file.  CC: right wrist pain follow up   YEP:Dlagzrupcz  10/04/2024 Concerned with some of the findings of the lunate triquetrium from stenosis.  Concerned that this could be potentially contributing to more the discomfort and pain.  Given a carpal tunnel injection that did help with the radicular symptoms but unfortunately did not help with the severe sharp pain that goes up into the forearm.  Do feel at this point further evaluation with an MR arthrogram would be beneficial.  Depending on findings we will discuss further evaluation and treatment      Update 11/29/2024 OCTA Stephanie Mcguire is a 53 y.o. female coming in with complaint of R wrist pain. Patient states here for MSK. Has seen DR about wrist pain. Referral for TMJ. Patient continues to have some discomfort and pain.  Trying to be active but finding it difficult sometimes due to the discomfort.    Past Medical History:  Diagnosis Date   Allergy    Anxiety    Aphthous ulcer    Arthritis    spondylosis   Asthma    Bipolar 2 disorder (HCC)    Bipolar disease, chronic (HCC)    Chest pain    Depression    DOE (dyspnea on exertion)    Elevated cholesterol 2000   GERD (gastroesophageal reflux disease)    Hyperlipidemia    Thyroid  disease    Past Surgical History:  Procedure Laterality Date   BUNIONECTOMY     MANDIBLE SURGERY  12/29/2007   RECONSTRUCTION OF EYELID  1989 and 1990   UPPER GASTROINTESTINAL ENDOSCOPY     Social History   Socioeconomic History   Marital status: Married    Spouse name: Not on file   Number of children: 2   Years of education: Not on file   Highest education level: Master's degree (e.g., MA, MS, MEng, MEd, MSW, MBA)  Occupational History    Occupation: homemaker  Tobacco Use   Smoking status: Never   Smokeless tobacco: Never  Vaping Use   Vaping status: Never Used  Substance and Sexual Activity   Alcohol use: Not Currently    Alcohol/week: 2.0 standard drinks of alcohol    Types: 2 Glasses of wine per week    Comment: rarely   Drug use: No   Sexual activity: Not Currently    Birth control/protection: Pill  Other Topics Concern   Not on file  Social History Narrative   Not on file   Social Drivers of Health   Financial Resource Strain: Low Risk  (06/15/2024)   Received from [facility]   Overall Financial Resource Strain (CARDIA)    Difficulty of Paying Living Expenses: Not hard at all  Food Insecurity: No Food Insecurity (06/15/2024)   Received from [facility]   Hunger Vital Sign    Within the past 12 months, you worried that your food would run out before you got the money to buy more.: Never true    Within the past 12 months, the food you bought just didn't last and you didn't have money to get more.: Never true  Transportation Needs: No Transportation Needs (06/15/2024)   Received from [facility]   PRAPARE -  Transportation    In the past 12 months, has lack of transportation kept you from medical appointments or from getting medications?: No    Lack of Transportation (Non-Medical): No  Physical Activity: Insufficiently Active (05/14/2023)   Exercise Vital Sign    Days of Exercise per Week: 1 day    Minutes of Exercise per Session: 20 min  Stress: Stress Concern Present (05/14/2023)   [employer] of Occupational Health - Occupational Stress Questionnaire    Feeling of Stress : Rather much  Social Connections: Moderately Isolated (05/14/2023)   Social Connection and Isolation Panel    Frequency of Communication with Friends and Family: Twice a week    Frequency of Social Gatherings with Friends and Family: Once a week    Attends Religious Services:  Never    Database Administrator or Organizations: No    Attends Engineer, Structural: Not on file    Marital Status: Married   Allergies  Allergen Reactions   Gluten Meal     Other Reaction(s): Other (See Comments)  Headache GI upset   Family History  Problem Relation Age of Onset   Parkinson's disease Mother    Hypertension Father    Stroke Maternal Grandmother    Diabetes Paternal Grandfather    Heart disease Paternal Grandfather    Liver cancer Neg Hx    Colon cancer Neg Hx    Esophageal cancer Neg Hx    Rectal cancer Neg Hx    Stomach cancer Neg Hx     Current Outpatient Medications (Endocrine & Metabolic):    levonorgestrel -ethinyl estradiol (AVIANE) 0.1-20 MG-MCG tablet, Take 1 tablet by mouth daily.   levothyroxine  (SYNTHROID ) 100 MCG tablet, Take 100 mcg by mouth daily before breakfast.  Current Outpatient Medications (Cardiovascular):    atorvastatin  (LIPITOR) 20 MG tablet, Take 1 tablet (20 mg total) by mouth daily.  Current Outpatient Medications (Respiratory):    cetirizine -pseudoephedrine  (ZYRTEC -D) 5-120 MG tablet, Take 1 tablet by mouth 2 (two) times daily as needed for allergies.   fluticasone  (FLONASE ) 50 MCG/ACT nasal spray, Place 2 sprays into both nostrils daily.    Current Outpatient Medications (Other):    carbamazepine (CARBATROL) 200 MG 12 hr capsule, Take 200 mg by mouth 2 (two) times daily.   carbamide peroxide (DEBROX) 6.5 % OTIC solution, Place 5 drops into both ears 2 (two) times daily.   famotidine  (PEPCID ) 20 MG tablet, TAKE ONE TABLET BY MOUTH ONE TIME DAILY   gabapentin  (NEURONTIN ) 300 MG capsule, Take 1 capsule (300 mg total) by mouth 3 (three) times daily.   lamoTRIgine (LAMICTAL) 150 MG tablet, Take 300 mg by mouth at bedtime.   mometasone (ELOCON) 0.1 % ointment, Apply topically.   pantoprazole  (PROTONIX ) 40 MG tablet, TAKE ONE TABLET BY MOUTH ONE TIME DAILY   Reviewed prior external information including notes and imaging  from  primary care provider As well as notes that were available from care everywhere and other healthcare systems.  Past medical history, social, surgical and family history all reviewed in electronic medical record.  No pertanent information unless stated regarding to the chief complaint.   Review of Systems:  No headache, visual changes, nausea, vomiting, diarrhea, constipation, dizziness, abdominal pain, skin rash, fevers, chills, night sweats, weight loss, swollen lymph nodes, body aches, joint swelling, chest pain, shortness of breath, mood changes. POSITIVE muscle aches  Objective  Blood pressure 124/80, pulse 88, height 5' 5 (1.651 m), weight 148 lb (67.1 kg), SpO2 97%.  General: No apparent distress alert and oriented x3 mood and affect normal, dressed appropriately.  HEENT: Pupils equal, extraocular movements intact  Respiratory: Patient's speak in full sentences and does not appear short of breath  Cardiovascular: No lower extremity edema, non tender, no erythema  Back exam does have some loss of lordosis noted.  Some tenderness to palpation in the paraspinal musculature.   Osteopathic findings  C6 flexed rotated and side bent left T3 extended rotated and side bent right inhaled third rib T9 extended rotated and side bent left L2 flexed rotated and side bent right L3 flexed rotated and side bent left Sacrum right on right    Impression and Recommendations:  Scapular dyskinesis Discussed with patient again to continue with some strengthening exercises.  Discussed home exercises, increase activity slowly.  Patient will be having wrist surgery in the relatively near future.  Follow-up with me again in 6 to 8 weeks otherwise.     Decision today to treat with OMT was based on Physical Exam  After verbal consent patient was treated with HVLA, ME, FPR techniques in cervical, thoracic, rib, lumbar and sacral areas, all areas are chronic   Patient tolerated the procedure  well with improvement in symptoms  Patient given exercises, stretches and lifestyle modifications  See medications in patient instructions if given  Patient will follow up in 4-8 weeks   The above documentation has been reviewed and is accurate and complete Huston Stonehocker M Tytus Strahle, DO

## 2024-11-29 ENCOUNTER — Encounter: Payer: Self-pay | Admitting: Family Medicine

## 2024-11-29 ENCOUNTER — Ambulatory Visit: Admitting: Family Medicine

## 2024-11-29 VITALS — BP 124/80 | HR 88 | Ht 65.0 in | Wt 148.0 lb

## 2024-11-29 DIAGNOSIS — G2589 Other specified extrapyramidal and movement disorders: Secondary | ICD-10-CM | POA: Diagnosis not present

## 2024-11-29 DIAGNOSIS — M9901 Segmental and somatic dysfunction of cervical region: Secondary | ICD-10-CM | POA: Diagnosis not present

## 2024-11-29 DIAGNOSIS — M9904 Segmental and somatic dysfunction of sacral region: Secondary | ICD-10-CM

## 2024-11-29 DIAGNOSIS — M9908 Segmental and somatic dysfunction of rib cage: Secondary | ICD-10-CM | POA: Diagnosis not present

## 2024-11-29 DIAGNOSIS — M9903 Segmental and somatic dysfunction of lumbar region: Secondary | ICD-10-CM | POA: Diagnosis not present

## 2024-11-29 DIAGNOSIS — M9902 Segmental and somatic dysfunction of thoracic region: Secondary | ICD-10-CM

## 2024-11-29 NOTE — Patient Instructions (Signed)
 You are in great hands Write us  See us  in [facility] February

## 2024-11-29 NOTE — Assessment & Plan Note (Signed)
 Discussed with patient again to continue with some strengthening exercises.  Discussed home exercises, increase activity slowly.  Patient will be having wrist surgery in the relatively near future.  Follow-up with me again in 6 to 8 weeks otherwise.

## 2024-12-04 DIAGNOSIS — E782 Mixed hyperlipidemia: Secondary | ICD-10-CM | POA: Diagnosis not present

## 2024-12-04 DIAGNOSIS — E538 Deficiency of other specified B group vitamins: Secondary | ICD-10-CM | POA: Diagnosis not present

## 2024-12-04 DIAGNOSIS — R7309 Other abnormal glucose: Secondary | ICD-10-CM | POA: Diagnosis not present

## 2024-12-11 DIAGNOSIS — F3181 Bipolar II disorder: Secondary | ICD-10-CM | POA: Diagnosis not present

## 2024-12-11 DIAGNOSIS — E031 Congenital hypothyroidism without goiter: Secondary | ICD-10-CM | POA: Diagnosis not present

## 2024-12-11 DIAGNOSIS — J849 Interstitial pulmonary disease, unspecified: Secondary | ICD-10-CM | POA: Diagnosis not present

## 2024-12-11 DIAGNOSIS — N946 Dysmenorrhea, unspecified: Secondary | ICD-10-CM | POA: Diagnosis not present

## 2025-02-14 ENCOUNTER — Ambulatory Visit: Admitting: Family Medicine
